# Patient Record
Sex: Male | Born: 1980 | Race: Black or African American | Hispanic: No | Marital: Single | Smoking: Current every day smoker
Health system: Southern US, Community
[De-identification: ages and names within clinical notes are randomized; demographics above are authoritative.]

## PROBLEM LIST (undated history)

## (undated) ENCOUNTER — Emergency Department (HOSPITAL_COMMUNITY): Admission: EM | Payer: Self-pay | Source: Home / Self Care

## (undated) DIAGNOSIS — F329 Major depressive disorder, single episode, unspecified: Secondary | ICD-10-CM

## (undated) DIAGNOSIS — F3181 Bipolar II disorder: Secondary | ICD-10-CM

## (undated) DIAGNOSIS — I10 Essential (primary) hypertension: Secondary | ICD-10-CM

## (undated) DIAGNOSIS — F1120 Opioid dependence, uncomplicated: Secondary | ICD-10-CM

## (undated) DIAGNOSIS — F32A Depression, unspecified: Secondary | ICD-10-CM

## (undated) DIAGNOSIS — S0300XA Dislocation of jaw, unspecified side, initial encounter: Secondary | ICD-10-CM

## (undated) HISTORY — PX: FRACTURE SURGERY: SHX138

## (undated) HISTORY — DX: Opioid dependence, uncomplicated: F11.20

---
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
## 2001-11-17 ENCOUNTER — Encounter: Payer: Self-pay | Admitting: Emergency Medicine

## 2001-11-17 ENCOUNTER — Emergency Department (HOSPITAL_COMMUNITY): Admission: EM | Admit: 2001-11-17 | Discharge: 2001-11-17 | Payer: Self-pay | Admitting: Emergency Medicine

## 2006-10-31 ENCOUNTER — Emergency Department (HOSPITAL_COMMUNITY): Admission: EM | Admit: 2006-10-31 | Discharge: 2006-10-31 | Payer: Self-pay | Admitting: Emergency Medicine

## 2007-01-31 ENCOUNTER — Emergency Department (HOSPITAL_COMMUNITY): Admission: EM | Admit: 2007-01-31 | Discharge: 2007-01-31 | Payer: Self-pay | Admitting: Family Medicine

## 2009-04-26 ENCOUNTER — Emergency Department (HOSPITAL_COMMUNITY): Admission: EM | Admit: 2009-04-26 | Discharge: 2009-04-27 | Payer: Self-pay | Admitting: Internal Medicine

## 2010-02-17 ENCOUNTER — Emergency Department (HOSPITAL_COMMUNITY): Admission: EM | Admit: 2010-02-17 | Discharge: 2010-02-17 | Payer: Self-pay | Admitting: Emergency Medicine

## 2010-03-15 ENCOUNTER — Emergency Department (HOSPITAL_COMMUNITY): Admission: EM | Admit: 2010-03-15 | Discharge: 2010-03-15 | Payer: Self-pay | Admitting: Emergency Medicine

## 2011-04-03 LAB — DIFFERENTIAL
Basophils Absolute: 0 10*3/uL (ref 0.0–0.1)
Eosinophils Absolute: 0 10*3/uL (ref 0.0–0.7)
Eosinophils Relative: 0 % (ref 0–5)
Lymphs Abs: 0.9 10*3/uL (ref 0.7–4.0)
Neutrophils Relative %: 79 % — ABNORMAL HIGH (ref 43–77)

## 2011-04-03 LAB — BASIC METABOLIC PANEL
CO2: 29 mEq/L (ref 19–32)
GFR calc Af Amer: >60 mL/min (ref >60)
Potassium: 3.6 mEq/L (ref 3.5–5.1)

## 2011-04-03 LAB — CBC
MCHC: 34.4 g/dL (ref 30.0–36.0)
MCV: 93.6 fL (ref 78.0–100.0)
Platelets: 162 10*3/uL (ref 150–400)

## 2011-04-03 LAB — RAPID URINE DRUG SCREEN, HOSP PERFORMED
Barbiturates: NOT DETECTED
Opiates: NOT DETECTED

## 2011-04-03 LAB — TRICYCLICS SCREEN, URINE: TCA Scrn: NOT DETECTED

## 2011-04-03 LAB — ETHANOL: Alcohol, Ethyl (B): 72 mg/dL — ABNORMAL HIGH (ref 0–10)

## 2013-05-04 ENCOUNTER — Encounter (HOSPITAL_COMMUNITY): Payer: Self-pay | Admitting: Emergency Medicine

## 2013-05-04 ENCOUNTER — Emergency Department (HOSPITAL_COMMUNITY)
Admission: EM | Admit: 2013-05-04 | Discharge: 2013-05-04 | Disposition: A | Discharge status: Other Acute Inpatient Hospital | Payer: Self-pay | Attending: Emergency Medicine | Admitting: Emergency Medicine

## 2013-05-04 ENCOUNTER — Inpatient Hospital Stay (HOSPITAL_COMMUNITY)
Admission: AD | Admit: 2013-05-04 | Discharge: 2013-05-08 | DRG: 885 | Disposition: A | Discharge status: Home / Self Care | Payer: Federal, State, Local not specified - Other | Source: Intra-hospital | Attending: Psychiatry | Admitting: Psychiatry

## 2013-05-04 DIAGNOSIS — F32A Depression, unspecified: Secondary | ICD-10-CM

## 2013-05-04 DIAGNOSIS — R45850 Homicidal ideations: Secondary | ICD-10-CM

## 2013-05-04 DIAGNOSIS — F1424 Cocaine dependence with cocaine-induced mood disorder: Secondary | ICD-10-CM

## 2013-05-04 DIAGNOSIS — R45851 Suicidal ideations: Secondary | ICD-10-CM

## 2013-05-04 DIAGNOSIS — F17200 Nicotine dependence, unspecified, uncomplicated: Secondary | ICD-10-CM | POA: Insufficient documentation

## 2013-05-04 DIAGNOSIS — F3289 Other specified depressive episodes: Secondary | ICD-10-CM | POA: Insufficient documentation

## 2013-05-04 DIAGNOSIS — F319 Bipolar disorder, unspecified: Secondary | ICD-10-CM

## 2013-05-04 DIAGNOSIS — F329 Major depressive disorder, single episode, unspecified: Secondary | ICD-10-CM | POA: Insufficient documentation

## 2013-05-04 DIAGNOSIS — Z8659 Personal history of other mental and behavioral disorders: Secondary | ICD-10-CM | POA: Insufficient documentation

## 2013-05-04 DIAGNOSIS — I10 Essential (primary) hypertension: Secondary | ICD-10-CM | POA: Diagnosis present

## 2013-05-04 DIAGNOSIS — F1410 Cocaine abuse, uncomplicated: Secondary | ICD-10-CM | POA: Diagnosis present

## 2013-05-04 DIAGNOSIS — F314 Bipolar disorder, current episode depressed, severe, without psychotic features: Principal | ICD-10-CM | POA: Diagnosis present

## 2013-05-04 DIAGNOSIS — Z79899 Other long term (current) drug therapy: Secondary | ICD-10-CM

## 2013-05-04 HISTORY — DX: Essential (primary) hypertension: I10

## 2013-05-04 HISTORY — DX: Bipolar II disorder: F31.81

## 2013-05-04 LAB — ETHANOL: Alcohol, Ethyl (B): <11 mg/dL (ref 0–11)

## 2013-05-04 LAB — COMPREHENSIVE METABOLIC PANEL
ALT: 31 U/L (ref 0–53)
AST: 28 U/L (ref 0–37)
Alkaline Phosphatase: 36 U/L — ABNORMAL LOW (ref 39–117)
CO2: 28 mEq/L (ref 19–32)
Glucose, Bld: 95 mg/dL (ref 70–99)
Sodium: 143 mEq/L (ref 135–145)
Total Protein: 7.9 g/dL (ref 6.0–8.3)

## 2013-05-04 LAB — RAPID URINE DRUG SCREEN, HOSP PERFORMED
Benzodiazepines: NOT DETECTED
Cocaine: POSITIVE — AB

## 2013-05-04 LAB — CBC WITH DIFFERENTIAL/PLATELET
Basophils Absolute: 0.1 10*3/uL (ref 0.0–0.1)
Basophils Relative: 1 % (ref 0–1)
Eosinophils Absolute: 0.1 10*3/uL (ref 0.0–0.7)
Eosinophils Relative: 1 % (ref 0–5)
Hemoglobin: 15.5 g/dL (ref 13.0–17.0)
Lymphs Abs: 2.1 10*3/uL (ref 0.7–4.0)
MCH: 31.8 pg (ref 26.0–34.0)
Monocytes Absolute: 0.7 10*3/uL (ref 0.1–1.0)
Platelets: 186 10*3/uL (ref 150–400)
WBC: 8 10*3/uL (ref 4.0–10.5)

## 2013-05-04 LAB — URINALYSIS, ROUTINE W REFLEX MICROSCOPIC
Bilirubin Urine: NEGATIVE
Glucose, UA: NEGATIVE mg/dL
Hgb urine dipstick: NEGATIVE
Ketones, ur: NEGATIVE mg/dL
Protein, ur: NEGATIVE mg/dL

## 2013-05-04 LAB — VALPROIC ACID LEVEL: Valproic Acid Lvl: <10 ug/mL — ABNORMAL LOW (ref 50.0–100.0)

## 2013-05-04 MED ORDER — ACETAMINOPHEN 325 MG PO TABS: 650.0000 mg | ORAL_TABLET | ORAL | Status: DC | PRN

## 2013-05-04 MED ORDER — LORAZEPAM 1 MG PO TABS: 1.0000 mg | ORAL_TABLET | Freq: Three times a day (TID) | ORAL | Status: DC | PRN

## 2013-05-04 MED ORDER — IBUPROFEN 200 MG PO TABS: 600.0000 mg | ORAL_TABLET | Freq: Three times a day (TID) | ORAL | Status: DC | PRN

## 2013-05-04 NOTE — ED Notes (Signed)
Patient states just got out of jail 4 x days ago.  Patient states that he just wants to get help.  Patient advised both mother and father were murdered last year and has been having problems since.   Patient states he feels depressed and homicidal.  Patient states he feels homicidal towards family members and mother/father friends at times.   Patient states does not feel suicidal.

## 2013-05-04 NOTE — BH Assessment (Signed)
[facility] Assessment Progress Note   Patient has been accepted to [facility] by Donell Sievert, PA to Dr. Dub Mikes.  Room 300-2.  Nurse Dorathy Daft notified.  Patient has signed voluntary admission papers.  EDP notified.

## 2013-05-04 NOTE — BH Assessment (Signed)
[facility] Assessment Progress Note   This clinician talked to patient to get clarification about his SI/HI.  Patient talked about having feelings of wanting to harm other people.  When asked for clarification he stated "I have thoughts about killing people."  Patient did not name a particular person in mind but did say that he felt that way about some extended family members & family friend that "judge me" regarding how he should feel about his father murdering his mother then killing himself.  Patient said that he wanted to yell at them and ask "how would you feel if your mother was shot eight times?!"   Patient does talk about feeling paranoid.  Also is worried that if left untreated he could end up like his father and kill someone.  Patient says that he feels like his father was bi-polar and that he is the same way.  Patient says that he is still living in parents home and that this is not helping any but feels that he cannot move until he can move away from [facility].  Patient is interested in going back to [facility] but is concerned about getting started back on medication.  He is not currently evidencing any withdrawal symptoms.  Dr. Leretha Pol [facility] telepsychiatrist) had recommended inpatient psychiatric treatment.

## 2013-05-04 NOTE — ED Notes (Signed)
Called house coverage to advise that we need a sitter.   No coverage available at this time.   Advised charge Patent examiner need.  To get an aide to sit with patient.

## 2013-05-04 NOTE — ED Notes (Signed)
All belongings have been received and patient has been placed in paper scrubs.   Patient has been wanded by security.
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
## 2013-05-04 NOTE — ED Notes (Signed)
All cords, etc. Have been removed from room.   Jody, tech, is sitting with patient.

## 2013-05-04 NOTE — ED Notes (Signed)
Pt belongings placed in designated area in bin with pts identification information labeled on belongings, pt updated that items are secured, sitter at bedside, pt updated on SI & HI precautions & verbalizes understanding, will continue to monitor

## 2013-05-04 NOTE — ED Notes (Signed)
Patient belongings inventoried and valuables given to security and placed in lock box.  Patient had cash $19.00; change $1.30; check [phone]; cell phone and charger, set of keys and private documents placed in the valuables folder to security.  The other belongings were placed in a belongings bag and will go with patient to POD C.  (yellow shirt, pair of Air Jordans, pair of black pants, pair of black socks, partial pack of newports).

## 2013-05-05 ENCOUNTER — Encounter (HOSPITAL_COMMUNITY): Payer: Self-pay

## 2013-05-05 DIAGNOSIS — F4310 Post-traumatic stress disorder, unspecified: Secondary | ICD-10-CM

## 2013-05-05 DIAGNOSIS — F319 Bipolar disorder, unspecified: Secondary | ICD-10-CM

## 2013-05-05 DIAGNOSIS — F3130 Bipolar disorder, current episode depressed, mild or moderate severity, unspecified: Secondary | ICD-10-CM

## 2013-05-05 DIAGNOSIS — F1410 Cocaine abuse, uncomplicated: Secondary | ICD-10-CM

## 2013-05-05 DIAGNOSIS — F1424 Cocaine dependence with cocaine-induced mood disorder: Secondary | ICD-10-CM

## 2013-05-05 MED ORDER — ACETAMINOPHEN 325 MG PO TABS: 650.0000 mg | ORAL_TABLET | Freq: Four times a day (QID) | ORAL | Status: DC | PRN

## 2013-05-05 MED ORDER — MAGNESIUM HYDROXIDE 400 MG/5ML PO SUSP: 30.0000 mL | Freq: Every day | ORAL | Status: DC | PRN

## 2013-05-05 MED ORDER — NICOTINE 14 MG/24HR TD PT24
14.0000 mg | MEDICATED_PATCH | Freq: Every day | TRANSDERMAL | Status: DC
  Filled 2013-05-05 (×3): qty 1

## 2013-05-05 MED ORDER — DIVALPROEX SODIUM ER 500 MG PO TB24
500.0000 mg | ORAL_TABLET | Freq: Two times a day (BID) | ORAL | Status: DC
  Administered 2013-05-05 – 2013-05-08 (×7): 500 mg via ORAL
  Filled 2013-05-05 (×10): qty 1

## 2013-05-05 MED ORDER — TRAZODONE HCL 50 MG PO TABS
50.0000 mg | ORAL_TABLET | Freq: Every evening | ORAL | Status: DC | PRN
  Administered 2013-05-05 – 2013-05-06 (×2): 50 mg via ORAL
  Filled 2013-05-05 (×8): qty 1

## 2013-05-05 MED ORDER — CHLORDIAZEPOXIDE HCL 25 MG PO CAPS: 25.0000 mg | ORAL_CAPSULE | Freq: Four times a day (QID) | ORAL | Status: DC | PRN

## 2013-05-05 MED ORDER — ALUM & MAG HYDROXIDE-SIMETH 200-200-20 MG/5ML PO SUSP: 30.0000 mL | ORAL | Status: DC | PRN

## 2013-05-05 NOTE — [facility] Group Notes (Signed)
[facility] LCSW Group Therapy  05/05/2013  1:15 PM  Type of Therapy:  Group Therapy 1:15 to 2:30 PM  Participation Level:  Active  Participation Quality:  Appropriate  Affect:  Appropriate  Cognitive:  Alert and Oriented  Insight:  None shared  Engagement in Therapy:  Developing  Modes of Intervention:  Discussion, Education, Exploration and Support  Summary of Progress/Problems: Patient attended group presentation by staff member of  [employer] ([employer]). Maximo was attentive to presenter and others who shared during group session.  Appeared to identify with facilitator as he shared his story with group.   Clide Dales

## 2013-05-05 NOTE — Progress Notes (Signed)
Pt observed in his room in bed at this time.  He reports he is doing better this evening, but still depressed/sad.  He denies SI/HI/AV.  He is not having any withdrawal symptoms at this time.  He feels he needs to move out of his parents home because there are so many memories, but is unsure what his definite plans are.  He has attended some groups.  He makes his needs known to staff.  Support and encouragement offered.  Safety maintained with q15 minute checks.
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
## 2013-05-05 NOTE — Progress Notes (Signed)
Pt has been up for most groups today.  He rated his depression a 1 he stated,"I am not really depressed but I do feel anxious at 7 and hopelessness an 8".  Pt lives alone and he is hoping to move and placing himself "in a more positive environment" He denied any S/H ideations or A/V hallucinations.

## 2013-05-05 NOTE — [facility] Group Notes (Addendum)
[facility] LCSW Aftercare Discharge Planning Group Note   05/05/2013 8:45 AM  Participation Quality:  Did Not Attend   Dustin Marquez, LCSWA 05/05/2013  10:08 AM

## 2013-05-05 NOTE — [facility] Suicide Risk Assessment (Signed)
Suicide Risk Assessment  Admission Assessment     Nursing information obtained from:  Patient Demographic factors:  Male;Adolescent or young adult;Low socioeconomic status;Living alone;Unemployed Current Mental Status:  NA Loss Factors:  Decrease in vocational status;Loss of significant relationship;Legal issues;Financial problems / change in socioeconomic status Historical Factors:  Family history of suicide;Family history of mental illness or substance abuse;Domestic violence in family of origin Risk Reduction Factors:  Responsible for children under 63 years of age;Religious beliefs about death;Positive therapeutic relationship  CLINICAL FACTORS:   Bipolar Disorder:   Depressive phase Alcohol/Substance Abuse/Dependencies  COGNITIVE FEATURES THAT CONTRIBUTE TO RISK: None identified   SUICIDE RISK:   Moderate:  Frequent suicidal ideation with limited intensity, and duration, some specificity in terms of plans, no associated intent, good self-control, limited dysphoria/symptomatology, some risk factors present, and identifiable protective factors, including available and accessible social support.  PLAN OF CARE: Supportive approach/coping skills/relapse prevention/grief-loss                               Reassess and optimize treatment with medication  I certify that inpatient services furnished can reasonably be expected to improve the patient's condition.  Ryelynn Guedea A 05/05/2013, 4:26 PM

## 2013-05-05 NOTE — Progress Notes (Signed)
Recreation Therapy Notes  Date: 05.13.2014 Time: 3:00pm Location: [address] Dayroom  Group Topic/Focus: Problem Solving  Participation Level:  Did not attend  [employer], LRT/CTRS  Jearl Klinefelter 05/05/2013 5:30 PM
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
## 2013-05-05 NOTE — Tx Team (Signed)
Initial Interdisciplinary Treatment Plan  PATIENT STRENGTHS: (choose at least two) Ability for insight Active sense of humor Average or above average intelligence Capable of independent living General fund of knowledge Motivation for treatment/growth Physical Health Religious Affiliation  PATIENT STRESSORS: Substance abuse Traumatic event Dad murdered his mom and then commited suicide 07/2012   PROBLEM LIST: Problem List/Patient Goals Date to be addressed Date deferred Reason deferred Estimated date of resolution  Substance Abuse: cocaine  5/13     Depression 5/13     Increased risk for SI in r/t to family hx.... Although this pt is denying 5/13                                          DISCHARGE CRITERIA:  Improved stabilization in mood, thinking, and/or behavior Motivation to continue treatment in a less acute level of care Need for constant or close observation no longer present Verbal commitment to aftercare and medication compliance  PRELIMINARY DISCHARGE PLAN: Outpatient therapy Return to previous living arrangement  PATIENT/FAMIILY INVOLVEMENT: This treatment plan has been presented to and reviewed with the patient, TERRIL CHESTNUT.  The patient and family have been given the opportunity to ask questions and make suggestions.  Brydon Spahr A 05/05/2013, 6:09 AM

## 2013-05-05 NOTE — Progress Notes (Addendum)
Pt is a 32 yr old male admitted for cocaine and ETOH use. Pt is also endorsing depression, anxiety, and HI towards his parents friends. Last August his father murdered his mom and then committed suicide afterwards. This is this pt's primary stressor. Pt has Bipolar II disorder and has been self-medicating with cocaine and ETOH. He reports an average of  3-4 25 oz beers( every other day) and about $40-$80 of cocaine daily. Pt UDS was (+) for cocaine only. Pt was recently released from jail for an assault charge.Therefore his drug use has recently restarted.  He has an upcoming court date on July 15th. Besides Bipolar II this pt has an past medical hx of HTN. Pt is currently denying any SI or AVH. Pt is HI towards his parents' friends and family. Family has been fighting over his parents money. He is  reporting minimal support from only his brother. Pt has a PMH of htn and Bipolar II disorder. Pt has been orientated to the unit's policies and procedures. Pt remains safe at this time with q65min checks.

## 2013-05-05 NOTE — Progress Notes (Signed)
Adult Psychoeducational Group Note  Date:  05/05/2013 Time:  11:38 AM  Group Topic/Focus:  Recovery Goals:   The focus of this group is to identify appropriate goals for recovery and establish a plan to achieve them.  Participation Level:  Did Not Attend   Additional Comments: Patient did not attend due to being in a meeting with the doctor.   Dustin Marquez 05/05/2013, 11:38 AM

## 2013-05-06 NOTE — Tx Team (Signed)
Interdisciplinary Treatment Plan Update (Adult)  Date: 05/06/2013  Time Reviewed:  9:45 AM  Progress in Treatment: Attending groups: Yes Participating in groups:  Yes Taking medication as prescribed:  Yes Tolerating medication:  Yes Family/Significant othe contact made: CSW assessing  Patient understands diagnosis:  Yes Discussing patient identified problems/goals with staff:  Yes Medical problems stabilized or resolved:  Yes Denies suicidal/homicidal ideation: Yes Issues/concerns per patient self-inventory:  Yes Other:  New problem(s) identified: N/A  Discharge Plan or Barriers: Pt will follow up outpatient at [facility] for medication management and therapy.    Reason for Continuation of Hospitalization: Anxiety Depression Medication Stabilization  Comments: N/A  Estimated length of stay: possible Friday d/c  For review of initial/current patient goals, please see plan of care.  Attendees: Patient:     Family:     Physician:  Dr. Dub Mikes 05/06/2013 8:38 AM   Nursing:   Roswell Miners, RN 05/06/2013 8:38 AM   Clinical Social Worker:  Reyes Ivan, LCSWA 05/06/2013 8:38 AM   Other: Robbie Louis, RN 05/06/2013 8:38 AM   Other:  Serena Colonel, NP 05/06/2013 9:51 AM   Other:  Liliane Bade, BSW (Transitional Care Manager) 05/06/2013 9:52 AM   Other:     Other:    Other:    Other:    Other:    Other:    Other:      Scribe for Treatment Team:   Carmina Miller, 05/06/2013 , 8:02 AM

## 2013-05-06 NOTE — [facility] Group Notes (Signed)
[facility] LCSW Aftercare Discharge Planning Group Note   05/06/2013 8:45 AM  Participation Quality:  Did Not Attend  Dustin Marquez, LCSWA 05/06/2013 9:59 AM

## 2013-05-06 NOTE — [facility] Group Notes (Signed)
[facility] LCSW Group Therapy  05/06/2013  1:15 PM   Type of Therapy:  Group Therapy  Participation Level:  Active  Participation Quality:  Appropriate and Attentive  Affect:  Appropriate, Depressed and Flat  Cognitive:  Alert and Appropriate  Insight:  Developing/Improving  Engagement in Therapy:  Developing/Improving  Modes of Intervention:  Clarification, Confrontation, Discussion, Education, Exploration, Limit-setting, Orientation, Problem-solving, Rapport Building, Dance movement psychotherapist, Socialization and Support  Summary of Progress/Problems: The topic for group today was emotional regulation.  Pt participated in the discussion regarding what emotional regulation is and how it affects their life, positive and negative.  Pt discussed coping skills and ways they can regulate their emotions in a positive manner.   Pt actively listened to group discussion and appeared engaged in what others were sharing.  Pt stated that he was just listening today and was taking in what others were saying, but did not participate any further in group discussion.    Dustin Marquez, [place] 05/06/2013 2:46 PM

## 2013-05-06 NOTE — Progress Notes (Signed)
[facility] MD Progress Note  05/06/2013 2:47 PM Dustin Marquez  MRN:  [id number] Subjective:  Trying to anticipate what is going to happen once he gets out of here. States that temporarily he is going back to the house, but that eventually they plan to sell it as it is full of bad memories. He needs to find a job. Eventually would like to go back to school for Fall Semester. His mood is still unstable.  Diagnosis:  Bipolar Depressed, cocaine abuse  ADL's:  Intact  Sleep: Fair  Appetite:  Fair  Suicidal Ideation:  Plan:  denies Intent:  denies Means:  denies Homicidal Ideation:  Plan:  denies Intent:  denies Means:  denies AEB (as evidenced by):  Psychiatric Specialty Exam: Review of Systems  Constitutional: Negative.   HENT: Negative.   Eyes: Negative.   Respiratory: Negative.   Cardiovascular: Negative.   Gastrointestinal: Negative.   Genitourinary: Negative.   Musculoskeletal: Negative.   Skin: Negative.   Neurological: Negative.   Endo/Heme/Allergies: Negative.   Psychiatric/Behavioral: Positive for depression and substance abuse. The patient is nervous/anxious.     Blood pressure 104/71, pulse 59, temperature 97.6 F (36.4 C), temperature source Oral, resp. rate 18, height 5\' 11"  (1.803 m), weight 69.854 kg (154 lb).Body mass index is 21.49 kg/(m^2).  General Appearance: Disheveled  Eye Solicitor::  Fair  Speech:  Clear and Coherent and Slow  Volume:  Decreased  Mood:  Depressed  Affect:  Restricted  Thought Process:  Coherent and Goal Directed  Orientation:  Full (Time, Place, and Person)  Thought Content:  worries, concerns  Suicidal Thoughts:  No  Homicidal Thoughts:  No  Memory:  Immediate;   Fair Recent;   Fair Remote;   Fair  Judgement:  Fair  Insight:  Present  Psychomotor Activity:  Restlessness  Concentration:  Fair  Recall:  Fair  Akathisia:  No  Handed:  Right  AIMS (if indicated):     Assets:  Desire for Improvement  Sleep:  Number of Hours:  6.75   Current Medications: Current Facility-Administered Medications  Medication Dose Route Frequency Provider Last Rate Last Dose  . acetaminophen (TYLENOL) tablet 650 mg  650 mg Oral Q6H PRN Kerry Hough, PA-C      . alum & mag hydroxide-simeth (MAALOX/MYLANTA) 200-200-20 MG/5ML suspension 30 mL  30 mL Oral Q4H PRN Kerry Hough, PA-C      . chlordiazePOXIDE (LIBRIUM) capsule 25 mg  25 mg Oral QID PRN Kerry Hough, PA-C      . divalproex (DEPAKOTE ER) 24 hr tablet 500 mg  500 mg Oral BID Sanjuana Kava, NP   500 mg at 05/06/13 0816  . magnesium hydroxide (MILK OF MAGNESIA) suspension 30 mL  30 mL Oral Daily PRN Kerry Hough, PA-C      . traZODone (DESYREL) tablet 50 mg  50 mg Oral QHS,MR X 1 JABRIEL VANDUYNE, PA-C   50 mg at 05/05/13 2145    Lab Results: No results found for this or any previous visit (from the past 48 hour(s)).  Physical Findings: AIMS: Facial and Oral Movements Muscles of Facial Expression: None, normal Lips and Perioral Area: None, normal Jaw: None, normal Tongue: None, normal,Extremity Movements Upper (arms, wrists, hands, fingers): None, normal Lower (legs, knees, ankles, toes): None, normal, Trunk Movements Neck, shoulders, hips: None, normal, Overall Severity Severity of abnormal movements (highest score from questions above): None, normal Incapacitation due to abnormal movements: None, normal Patient's awareness of abnormal  movements (rate only patient's report): No Awareness, Dental Status Current problems with teeth and/or dentures?: No Does patient usually wear dentures?: No  CIWA:  CIWA-Ar Total: 2 COWS:     Treatment Plan Summary: Daily contact with patient to assess and evaluate symptoms and progress in treatment Medication management  Plan: Supportive approach/coping skills/relapse prevention           Continue to optimize treatment with medications  Medical Decision Making Problem Points:  Review of last therapy session (1) and  Review of psycho-social stressors (1) Data Points:  Review of medication regiment & side effects (2)  I certify that inpatient services furnished can reasonably be expected to improve the patient's condition.   Bryson Palen A 05/06/2013, 2:47 PM

## 2013-05-06 NOTE — Progress Notes (Signed)
D- Patient has been appropriate this shift attending groups and interacting with peers.  Rates depression at 8 and hopelessness at 5.  Denies SI.  Affect is bright.  No physical complaints and no prn medications requested.  A- Support and encouragement given. Continue POC and evaluation of treatment goals. Continue 15' checks for safety.  R- Safety maintained.

## 2013-05-06 NOTE — Progress Notes (Signed)
Adult Psychoeducational Group Note  Date:  05/06/2013 Time:  1:07 PM  Group Topic/Focus:  Crisis Planning:   The purpose of this group is to help patients create a crisis plan for use upon discharge or in the future, as needed.  Participation Level:  Did Not Attend  Additional Comments:  Pt was prompted several times to come to group but instead slept through group.   Dalia Heading 05/06/2013, 1:07 PM

## 2013-05-07 NOTE — Progress Notes (Signed)
Patient ID: LORAS GRIESHOP, male   DOB: [birth year], 32 y.o.   MRN: [id number] [facility] MD Progress Note  05/07/2013 1:38 PM JEDAIAH RATHBUN  MRN:  [id number]  Subjective:  "Mr. Mas reports feeling much better today. States that he slept well last night. Rated depression at #3. Is currently considering moving out of his parents home. Would like to stay busy, probably go back to his current job, but does not plan to stay on this job for a long time. Will likely look for another job eventually. Planned on going to some counseling after discharge as well".  Diagnosis:  Bipolar Depressed, cocaine abuse  ADL's:  Intact  Sleep: "I slept better"  Appetite:  Fair  Suicidal Ideation:  Plan:  denies Intent:  denies Means:  denies Homicidal Ideation:  Plan:  denies Intent:  denies Means:  denies  AEB (as evidenced by): patient's reports.  Psychiatric Specialty Exam: Review of Systems  Constitutional: Negative.   HENT: Negative.   Eyes: Negative.   Respiratory: Negative.   Cardiovascular: Negative.   Gastrointestinal: Negative.   Genitourinary: Negative.   Musculoskeletal: Negative.   Skin: Negative.   Neurological: Negative.   Endo/Heme/Allergies: Negative.   Psychiatric/Behavioral: Positive for depression and substance abuse. The patient is nervous/anxious.     Blood pressure 119/80, pulse 74, temperature 98.4 F (36.9 C), temperature source Oral, resp. rate 18, height 5\' 11"  (1.803 m), weight 69.854 kg (154 lb).Body mass index is 21.49 kg/(m^2).  General Appearance: Disheveled  Eye Solicitor::  Fair  Speech:  Clear and Coherent and Slow  Volume:  Decreased  Mood:  Depressed  Affect:  Restricted  Thought Process:  Coherent and Goal Directed  Orientation:  Full (Time, Place, and Person)  Thought Content:  worries, concerns  Suicidal Thoughts:  No  Homicidal Thoughts:  No  Memory:  Immediate;   Fair Recent;   Fair Remote;   Fair  Judgement:  Fair  Insight:  Present   Psychomotor Activity:  Restlessness  Concentration:  Fair  Recall:  Fair  Akathisia:  No  Handed:  Right  AIMS (if indicated):     Assets:  Desire for Improvement  Sleep:  Number of Hours: 6.25   Current Medications: Current Facility-Administered Medications  Medication Dose Route Frequency Provider Last Rate Last Dose  . acetaminophen (TYLENOL) tablet 650 mg  650 mg Oral Q6H PRN Kerry Hough, PA-C      . alum & mag hydroxide-simeth (MAALOX/MYLANTA) 200-200-20 MG/5ML suspension 30 mL  30 mL Oral Q4H PRN Kerry Hough, PA-C      . chlordiazePOXIDE (LIBRIUM) capsule 25 mg  25 mg Oral QID PRN Kerry Hough, PA-C      . divalproex (DEPAKOTE ER) 24 hr tablet 500 mg  500 mg Oral BID Sanjuana Kava, NP   500 mg at 05/07/13 0826  . magnesium hydroxide (MILK OF MAGNESIA) suspension 30 mL  30 mL Oral Daily PRN Kerry Hough, PA-C      . traZODone (DESYREL) tablet 50 mg  50 mg Oral QHS,MR X 1 BINGHAM MILLETTE, PA-C   50 mg at 05/06/13 2134    Lab Results: No results found for this or any previous visit (from the past 48 hour(s)).  Physical Findings: AIMS: Facial and Oral Movements Muscles of Facial Expression: None, normal Lips and Perioral Area: None, normal Jaw: None, normal Tongue: None, normal,Extremity Movements Upper (arms, wrists, hands, fingers): None, normal Lower (legs, knees, ankles, toes): None, normal, Trunk  Movements Neck, shoulders, hips: None, normal, Overall Severity Severity of abnormal movements (highest score from questions above): None, normal Incapacitation due to abnormal movements: None, normal Patient's awareness of abnormal movements (rate only patient's report): No Awareness, Dental Status Current problems with teeth and/or dentures?: No Does patient usually wear dentures?: No  CIWA:  CIWA-Ar Total: 2 COWS:     Treatment Plan Summary: Daily contact with patient to assess and evaluate symptoms and progress in treatment Medication management  Plan:  Supportive approach/coping skills/relapse prevention Continue to optimize treatment with medications. Will benefit from grief counseling.  Medical Decision Making Problem Points:  Review of last therapy session (1) and Review of psycho-social stressors (1) Data Points:  Review of medication regiment & side effects (2)  I certify that inpatient services furnished can reasonably be expected to improve the patient's condition.   Armandina Stammer I 05/07/2013, 1:38 PM

## 2013-05-07 NOTE — Progress Notes (Signed)
Recreation Therapy Notes  Date: 05.15.2014 Time: 3:00pm Location: [address] Dayroom      Group Topic/Focus: Leisure Education  Participation Level: Did not attend  [employer], LRT/CTRS  Jearl Klinefelter 05/07/2013 4:25 PM

## 2013-05-07 NOTE — Progress Notes (Signed)
D: Patient appropriate and cooperative with staff and peers. Patient's affect/mood is depressed. Declined self inventory sheet today. Patient is attending and participating in groups. Compliant with medication regimen.  A: Support and encouragement provided to patient. Scheduled medications administered per MD orders. Maintain Q15 minute checks for safety.   R: Patient receptive. Denies SI/HI/AVH. Patient remains safe.

## 2013-05-07 NOTE — Progress Notes (Signed)
Patient ID: Dustin Marquez, male   DOB: Jan 04, 1981, 32 y.o.   MRN: [id number] D: Took over patient's care @ 2330. Patient in bed sleeping. Respiration regular and unlabored. No sign of distress noted at this time A: 15 mins checks for safety. R: Patient remains asleep. Pt is safe.

## 2013-05-07 NOTE — Progress Notes (Signed)
Patient did attend the evening karaoke group. Pt was attentive and supportive.   

## 2013-05-07 NOTE — [facility] Group Notes (Signed)
[facility] LCSW Aftercare Discharge Planning Group Note   05/07/2013 8:45 AM  Participation Quality:  Did Not Attend  CSW attempted to get pt up for group and pt stated that he is drowsy today and doesn't want to get up.  Pt states that he is hopeful he will d/c tomorrow.  CSW explained pt's follow up with [facility].  No further needs voiced by pt at this time.     Dustin Marquez, LCSWA 05/07/2013 9:24 AM

## 2013-05-07 NOTE — Tx Team (Signed)
Interdisciplinary Treatment Plan Update (Adult)  Date: 05/08/13 Time Reviewed:  9:45 AM  Progress in Treatment: Attending groups: Yes Participating in groups:  Yes Taking medication as prescribed:  Yes Tolerating medication:  Yes Family/Significant othe contact made: Consent given to talk to brother Patient understands diagnosis:  Yes Discussing patient identified problems/goals with staff:  Yes Medical problems stabilized or resolved:  Yes Denies suicidal/homicidal ideation: Yes Issues/concerns per patient self-inventory:  Yes Other:  New problem(s) identified: N/A  Discharge Plan or Barriers: Pt will follow up with [facility] for medication management and therapy.   Reason for Continuation of Hospitalization: Stable to d/c  Comments: N/A  Estimated length of stay: D/C today  For review of initial/current patient goals, please see plan of care.  Attendees: Patient:     Family:     Physician:  Dr. Dub Mikes 05/08/13 10:00 AM  Nursing:   Roswell Miners, RN 05/08/13 10:00 AM  Clinical Social Worker:  Reyes Ivan, LCSWA 05/08/13 10:00 AM  Other: Robbie Louis, RN 05/08/13 10:00 AM  Other:  Serena Colonel, NP 05/08/13 10:00 AM  Other:  Liliane Bade, BSW (Transitional Care Manager) 05/08/13 10:00 AM  Other:     Other:    Other:    Other:    Other:    Other:    Other:     Scribe for Treatment Team:   Reyes Ivan, LCSWA 05/08/13  10:35 AM

## 2013-05-07 NOTE — [facility] Group Notes (Signed)
[facility] LCSW Group Therapy  05/07/2013  1:15 PM   Type of Therapy:  Group Therapy  Participation Level:  Active  Participation Quality:  Appropriate and Attentive  Affect:  Appropriate, Flat and Depressed  Cognitive:  Alert and Appropriate  Insight:  Developing/Improving and Engaged  Engagement in Therapy:  Developing/Improving and Engaged  Modes of Intervention:  Clarification, Confrontation, Discussion, Education, Exploration, Limit-setting, Orientation, Problem-solving, Rapport Building, Socialization and Support  Summary of Progress/Problems: The topic for group was balance in life.  Pt participated in the discussion about when their life was in balance and out of balance and how this feels.  Pt discussed ways to get back in balance and short term goals they can work on to get where they want to be.  Pt shared that he was isolating so much from family and friends that he "went crazy".  Pt was able to relate to others about isolation but minimally shares.  Pt was quiet but appeared to actively listen to group discussion.    Reyes Ivan, LCSWA 05/07/2013 2:30 PM

## 2013-05-07 NOTE — B[facility] Suicide Risk Assessment (Signed)
[facility] INPATIENT:  Family/Significant Other Suicide Prevention Education  Suicide Prevention Education:  Education Completed; Dustin Marquez - brother [phone]),  (name of family member/significant other) has been identified by the patient as the family member/significant other with whom the patient will be residing, and identified as the person(s) who will aid the patient in the event of a mental health crisis (suicidal ideations/suicide attempt).  With written consent from the patient, the family member/significant other has been provided the following suicide prevention education, prior to the and/or following the discharge of the patient.  The suicide prevention education provided includes the following:  Suicide risk factors  Suicide prevention and interventions  National Suicide Hotline telephone number  [facility] assessment telephone number  [facility] Emergency Assistance 911  [facility] and/or Residential Mobile Crisis Unit telephone number  Request made of family/significant other to:  Remove weapons (e.g., guns, rifles, knives), all items previously/currently identified as safety concern.    Remove drugs/medications (over-the-counter, prescriptions, illicit drugs), all items previously/currently identified as a safety concern.  The family member/significant other verbalizes understanding of the suicide prevention education information provided.  The family member/significant other agrees to remove the items of safety concern listed above.  Dustin Marquez 05/07/2013, 11:14 AM

## 2013-05-07 NOTE — Progress Notes (Signed)
[facility] Adult Case Management Discharge Plan :  Will you be returning to the same living situation after discharge: Yes,  returning home At discharge, do you have transportation home?:Yes,  access to transportation Do you have the ability to pay for your medications:Yes,  access to meds  Release of information consent forms completed and in the chart;  Patient's signature needed at discharge.  Patient to Follow up at: Follow-up Information   Follow up with [facility] On 05/11/2013. (Appointment scheduled at 8:00 am promptly with Dr. Pecola Leisure for medication management (say that you're there for hospital discharge!!))    Contact information:   [address], [place] [address] Phone: [phone] Fax: [phone]      Patient denies SI/HI:   Yes,  denies SI/HI    Safety Planning and Suicide Prevention discussed:  Yes,  discussed with pt and pt's brother (see suicide prevention note)  Horton, Salome Arnt 05/07/2013, 12:16 PM

## 2013-05-08 DIAGNOSIS — F314 Bipolar disorder, current episode depressed, severe, without psychotic features: Principal | ICD-10-CM

## 2013-05-08 MED ORDER — DIVALPROEX SODIUM ER 500 MG PO TB24: 500.0000 mg | ORAL_TABLET | Freq: Two times a day (BID) | ORAL | Status: DC

## 2013-05-08 MED ORDER — TRAZODONE HCL 50 MG PO TABS: 50.0000 mg | ORAL_TABLET | Freq: Every evening | ORAL | Status: DC | PRN

## 2013-05-08 NOTE — Progress Notes (Signed)
D   Pt is appropriate and pleasant   He interacts well with others  And is compliant with treatment A   Verbal support given  Medications administered and effectiveness monitored   Q 15 min checks R   Pt safe at present

## 2013-05-08 NOTE — [facility] Suicide Risk Assessment (Signed)
Suicide Risk Assessment  Discharge Assessment     Demographic Factors:  Male  Mental Status Per Nursing Assessment::   On Admission:  NA  Current Mental Status by Physician: In full contact with reality. There are no suicidal ideas, plans or intent. His mood is euthymic. His affect is appropriate. There are no suicidal ideas plans or intent. He is planning to go back to his house. His brother is going to be there too. They are planning to eventually sell the house. He is committed to continue to take his medications, and abstain   Loss Factors: Loss of significant relationship, legal issues  Historical Factors: Family history of suicide  Risk Reduction Factors:   Sense of responsibility to family, Employed and Living with another person, especially Marquez relative  Continued Clinical Symptoms:  Bipolar Disorder:   Depressive phase Alcohol/Substance Abuse/Dependencies  Cognitive Features That Contribute To Risk: None identified   Suicide Risk:  Minimal: No identifiable suicidal ideation.  Patients presenting with no risk factors but with morbid ruminations; may be classified as minimal risk based on the severity of the depressive symptoms  Discharge Diagnoses:   AXIS I:  Bipolar Disorder, Cocaine Abuse AXIS II:  Deferred AXIS III:   Past Medical History  Diagnosis Date  . Hypertension   . Bipolar 2 disorder    AXIS IV:  other psychosocial or environmental problems AXIS V:  61-70 mild symptoms  Plan Of Care/Follow-up recommendations:  Activity:  as tolerated Diet:  regular Follow up [facility] Is patient on multiple antipsychotic therapies at discharge:  No   Has Patient had three or more failed trials of antipsychotic monotherapy by history:  No  Recommended Plan for Multiple Antipsychotic Therapies: N/Marquez   Dustin Marquez 05/08/2013, 9:54 AM
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
## 2013-05-08 NOTE — Progress Notes (Signed)
Discharge Note: Discharge instructions/prescriptions/medication samples given to patient. Patient verbalized understanding of discharge instructions and prescriptions. Returned belongings to patient. Denies SI/HI/AVH. Patient d/c without incident to the front lobby and transported by a relative.

## 2013-05-13 NOTE — Progress Notes (Signed)
Patient Discharge Instructions:  After Visit Summary (AVS):   Faxed to:  05/13/13 Discharge Summary Note:   Faxed to:  05/13/13 Psychiatric Admission Assessment Note:   Faxed to:  05/13/13 Suicide Risk Assessment - Discharge Assessment:   Faxed to:  05/13/13 Faxed/Sent to the Next Level Care provider:  05/13/13 Faxed to [facility] @ [phone]  Jerelene Redden, 05/13/2013, 5:04 PM

## 2013-09-06 ENCOUNTER — Emergency Department (HOSPITAL_COMMUNITY): Payer: Self-pay

## 2013-09-06 ENCOUNTER — Emergency Department (HOSPITAL_COMMUNITY)
Admission: EM | Admit: 2013-09-06 | Discharge: 2013-09-06 | Disposition: A | Discharge status: Home / Self Care | Payer: Self-pay | Attending: Emergency Medicine | Admitting: Emergency Medicine

## 2013-09-06 ENCOUNTER — Encounter (HOSPITAL_COMMUNITY): Payer: Self-pay

## 2013-09-06 DIAGNOSIS — Y929 Unspecified place or not applicable: Secondary | ICD-10-CM | POA: Insufficient documentation

## 2013-09-06 DIAGNOSIS — F17200 Nicotine dependence, unspecified, uncomplicated: Secondary | ICD-10-CM | POA: Insufficient documentation

## 2013-09-06 DIAGNOSIS — I10 Essential (primary) hypertension: Secondary | ICD-10-CM | POA: Insufficient documentation

## 2013-09-06 DIAGNOSIS — Y9389 Activity, other specified: Secondary | ICD-10-CM | POA: Insufficient documentation

## 2013-09-06 DIAGNOSIS — Z8659 Personal history of other mental and behavioral disorders: Secondary | ICD-10-CM | POA: Insufficient documentation

## 2013-09-06 DIAGNOSIS — S93409A Sprain of unspecified ligament of unspecified ankle, initial encounter: Secondary | ICD-10-CM | POA: Insufficient documentation

## 2013-09-06 DIAGNOSIS — Z79899 Other long term (current) drug therapy: Secondary | ICD-10-CM | POA: Insufficient documentation

## 2013-09-06 DIAGNOSIS — W010XXA Fall on same level from slipping, tripping and stumbling without subsequent striking against object, initial encounter: Secondary | ICD-10-CM | POA: Insufficient documentation

## 2013-09-06 MED ORDER — IBUPROFEN 800 MG PO TABS
800.0000 mg | ORAL_TABLET | Freq: Once | ORAL | Status: AC
  Administered 2013-09-06: 800 mg via ORAL
  Filled 2013-09-06: qty 1

## 2013-09-06 MED ORDER — IBUPROFEN 800 MG PO TABS: 800.0000 mg | ORAL_TABLET | Freq: Three times a day (TID) | ORAL | Status: DC | PRN
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
## 2013-09-07 NOTE — ED Provider Notes (Signed)
Medical screening examination/treatment/procedure(s) were performed by non-physician practitioner and as supervising physician I was immediately available for consultation/collaboration.   Candyce Churn, MD 09/07/13 1106

## 2013-09-15 ENCOUNTER — Encounter (HOSPITAL_COMMUNITY): Payer: Self-pay | Admitting: Emergency Medicine

## 2013-09-15 ENCOUNTER — Emergency Department (HOSPITAL_COMMUNITY)
Admission: EM | Admit: 2013-09-15 | Discharge: 2013-09-16 | Disposition: A | Discharge status: Home / Self Care | Payer: Self-pay | Attending: Emergency Medicine | Admitting: Emergency Medicine

## 2013-09-15 DIAGNOSIS — F329 Major depressive disorder, single episode, unspecified: Secondary | ICD-10-CM | POA: Insufficient documentation

## 2013-09-15 DIAGNOSIS — F17200 Nicotine dependence, unspecified, uncomplicated: Secondary | ICD-10-CM | POA: Insufficient documentation

## 2013-09-15 DIAGNOSIS — F3289 Other specified depressive episodes: Secondary | ICD-10-CM | POA: Insufficient documentation

## 2013-09-15 DIAGNOSIS — F3189 Other bipolar disorder: Secondary | ICD-10-CM | POA: Insufficient documentation

## 2013-09-15 DIAGNOSIS — F314 Bipolar disorder, current episode depressed, severe, without psychotic features: Secondary | ICD-10-CM

## 2013-09-15 DIAGNOSIS — Z9119 Patient's noncompliance with other medical treatment and regimen: Secondary | ICD-10-CM | POA: Insufficient documentation

## 2013-09-15 DIAGNOSIS — Z91199 Patient's noncompliance with other medical treatment and regimen due to unspecified reason: Secondary | ICD-10-CM | POA: Insufficient documentation

## 2013-09-15 DIAGNOSIS — I10 Essential (primary) hypertension: Secondary | ICD-10-CM | POA: Insufficient documentation

## 2013-09-15 HISTORY — DX: Major depressive disorder, single episode, unspecified: F32.9

## 2013-09-15 HISTORY — DX: Dislocation of jaw, unspecified side, initial encounter: S03.00XA

## 2013-09-15 HISTORY — DX: Depression, unspecified: F32.A

## 2013-09-15 LAB — COMPREHENSIVE METABOLIC PANEL
ALT: 23 U/L (ref 0–53)
AST: 20 U/L (ref 0–37)
Albumin: 4 g/dL (ref 3.5–5.2)
CO2: 29 mEq/L (ref 19–32)
Calcium: 9.2 mg/dL (ref 8.4–10.5)
Chloride: 101 mEq/L (ref 96–112)
GFR calc non Af Amer: 84 mL/min — ABNORMAL LOW (ref >90)
Sodium: 136 mEq/L (ref 135–145)

## 2013-09-15 LAB — CBC
MCH: 31.3 pg (ref 26.0–34.0)
Platelets: 265 10*3/uL (ref 150–400)
RBC: 4.86 MIL/uL (ref 4.22–5.81)
RDW: 13 % (ref 11.5–15.5)
WBC: 7.4 10*3/uL (ref 4.0–10.5)

## 2013-09-15 LAB — RAPID URINE DRUG SCREEN, HOSP PERFORMED
Amphetamines: NOT DETECTED
Barbiturates: NOT DETECTED
Cocaine: NOT DETECTED
Tetrahydrocannabinol: NOT DETECTED

## 2013-09-15 MED ORDER — IBUPROFEN 800 MG PO TABS
800.0000 mg | ORAL_TABLET | Freq: Three times a day (TID) | ORAL | Status: DC | PRN
  Administered 2013-09-15: 800 mg via ORAL
  Filled 2013-09-15: qty 1

## 2013-09-15 MED ORDER — ZOLPIDEM TARTRATE 5 MG PO TABS: 5.0000 mg | ORAL_TABLET | Freq: Every evening | ORAL | Status: DC | PRN

## 2013-09-15 MED ORDER — ONDANSETRON HCL 4 MG PO TABS: 4.0000 mg | ORAL_TABLET | Freq: Three times a day (TID) | ORAL | Status: DC | PRN

## 2013-09-15 MED ORDER — ACETAMINOPHEN 325 MG PO TABS: 650.0000 mg | ORAL_TABLET | ORAL | Status: DC | PRN

## 2013-09-15 MED ORDER — LORAZEPAM 1 MG PO TABS: 1.0000 mg | ORAL_TABLET | Freq: Three times a day (TID) | ORAL | Status: DC | PRN

## 2013-09-15 MED ORDER — DIVALPROEX SODIUM ER 500 MG PO TB24
500.0000 mg | ORAL_TABLET | Freq: Two times a day (BID) | ORAL | Status: DC
  Administered 2013-09-15 – 2013-09-16 (×3): 500 mg via ORAL
  Filled 2013-09-15 (×6): qty 1
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
## 2013-09-15 NOTE — ED Notes (Signed)
Patient belongings searched by security and secured in TCU locker #[id number].

## 2013-09-15 NOTE — Progress Notes (Signed)
[facility] provided pt with a [employer] Orange Card application, highlighting [facility].  °

## 2013-09-15 NOTE — ED Notes (Signed)
Per EMS. Patient called 911 for medical clearance. Patient reports histroy of bipolar and has been off his meds for an extended period of time due to financial reasons. Patient reports his father committed suicide in 2013. Patient denies SI to EMS. Patient A&O x4, NAD. Walked in.

## 2013-09-15 NOTE — ED Notes (Signed)
Patient resting in bed with no s/s of distress noted. Pt pleasant and cooperative with staff. Pt denies SI/HI at this time.

## 2013-09-15 NOTE — ED Notes (Signed)
Patient sleeping, sitter in room will do vitals when pt awakes or within the next 2 hours.

## 2013-09-15 NOTE — ED Notes (Signed)
Patient states his headache started just prior to [employer] arrival at his home. Patient denies taking any medication PTA.

## 2013-09-15 NOTE — Consult Note (Signed)
Dustin Sheckler [birth year] [id number]  Subjective Patient states that he is having suicidal and homicidal thoughts. Patient denies psychosis but states that he has paranoia that some one is out to get him.  Patient states that he felt that he was going to hurt some one so he came to the hospital.  Patient states that he has been off of his medication for 3-4 months (Depakote).  Patient states that he has to be in court on 09/21/2013 on charges Felony posession of gun.     Current Medication Current facility-administered medications:acetaminophen (TYLENOL) tablet 650 mg, 650 mg, Oral, Q4H PRN, Lyanne Co, MD;  divalproex (DEPAKOTE ER) 24 hr tablet 500 mg, 500 mg, Oral, BID, Lyanne Co, MD, 500 mg at 09/15/13 0413;  ibuprofen (ADVIL,MOTRIN) tablet 800 mg, 800 mg, Oral, Q8H PRN, Lyanne Co, MD, 800 mg at 09/15/13 0413;  LORazepam (ATIVAN) tablet 1 mg, 1 mg, Oral, Q8H PRN, Lyanne Co, MD ondansetron [facility]) tablet 4 mg, 4 mg, Oral, Q8H PRN, Lyanne Co, MD;  zolpidem [facility]) tablet 5 mg, 5 mg, Oral, QHS PRN, Lyanne Co, MD Current outpatient prescriptions:ibuprofen (ADVIL,MOTRIN) 200 MG tablet, Take 400 mg by mouth every 6 (six) hours as needed for pain., Disp: , Rfl:   Current results  Recent Results (from the past 2160 hour(s))  CBC     Status: None   Collection Time    09/15/13  3:50 AM      Result Value Range   WBC 7.4  4.0 - 10.5 K/uL   RBC 4.86  4.22 - 5.81 MIL/uL   Hemoglobin 15.2  13.0 - 17.0 g/dL   HCT 40.9  81.1 - 91.4 %   MCV 92.8  78.0 - 100.0 fL   MCH 31.3  26.0 - 34.0 pg   MCHC 33.7  30.0 - 36.0 g/dL   RDW 78.2  95.6 - 21.3 %   Platelets 265  150 - 400 K/uL  COMPREHENSIVE METABOLIC PANEL     Status: Abnormal   Collection Time    09/15/13  3:50 AM      Result Value Range   Sodium 136  135 - 145 mEq/L   Potassium 3.8  3.5 - 5.1 mEq/L   Chloride 101  96 - 112 mEq/L   CO2 29  19 - 32 mEq/L   Glucose, Bld 95  70 - 99 mg/dL   BUN 9  6 - 23 mg/dL   Creatinine, Ser 0.86  0.50 - 1.35 mg/dL   Calcium 9.2  8.4 - 57.8 mg/dL   Total Protein 7.3  6.0 - 8.3 g/dL   Albumin 4.0  3.5 - 5.2 g/dL   AST 20  0 - 37 U/L   ALT 23  0 - 53 U/L   Alkaline Phosphatase 30 (*) 39 - 117 U/L   Total Bilirubin 0.5  0.3 - 1.2 mg/dL   GFR calc non Af Amer 84 (*) >90 mL/min   GFR calc Af Amer >90  >90 mL/min   Comment: (NOTE)     The eGFR has been calculated using the CKD EPI equation.     This calculation has not been validated in all clinical situations.     eGFR's persistently <90 mL/min signify possible Chronic Kidney     Disease.  ETHANOL     Status: None   Collection Time    09/15/13  3:50 AM      Result Value Range   Alcohol, Ethyl (B) <11  0 - 11 mg/dL   Comment:            LOWEST DETECTABLE LIMIT FOR     SERUM ALCOHOL IS 11 mg/dL     FOR MEDICAL PURPOSES ONLY  URINE RAPID DRUG SCREEN (HOSP PERFORMED)     Status: None   Collection Time    09/15/13  4:19 AM      Result Value Range   Opiates NONE DETECTED  NONE DETECTED   Cocaine NONE DETECTED  NONE DETECTED   Benzodiazepines NONE DETECTED  NONE DETECTED   Amphetamines NONE DETECTED  NONE DETECTED   Tetrahydrocannabinol NONE DETECTED  NONE DETECTED   Barbiturates NONE DETECTED  NONE DETECTED   Comment:            DRUG SCREEN FOR MEDICAL PURPOSES     ONLY.  IF CONFIRMATION IS NEEDED     FOR ANY PURPOSE, NOTIFY LAB     WITHIN 5 DAYS.                LOWEST DETECTABLE LIMITS     FOR URINE DRUG SCREEN     Drug Class       Cutoff (ng/mL)     Amphetamine      1000     Barbiturate      200     Benzodiazepine   200     Tricyclics       300     Opiates          300     Cocaine          300     THC              50  VALPROIC ACID LEVEL     Status: Abnormal   Collection Time    09/15/13 12:30 PM      Result Value Range   Valproic Acid Lvl 18.2 (*) 50.0 - 100.0 ug/mL   Comment: Performed at [facility]   Assessment Axis I: Depressive Disorder NOS and Mood Disorder NOS Axis II:  Deferred Axis III:  Past Medical History  Diagnosis Date  . Hypertension   . Bipolar 2 disorder   . Jaw dislocation   . Depression    Axis IV: other psychosocial or environmental problems, problems related to legal system/crime, problems related to social environment and problems with primary support group Axis V: 51-60 moderate symptoms  Face to face interview and consult with Dr. Lucianne Muss  Plan Disposition:  Reassess in the morning for disposition.  Depakote level and start medication.    Monitor for safety and stabilization.
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
## 2013-09-15 NOTE — ED Notes (Signed)
Dr. Campos at bedside   

## 2013-09-15 NOTE — ED Notes (Signed)
Bed: ZO10 Expected date: 09/15/13 Expected time: 3:00 AM Means of arrival: Ambulance Comments: Psych eval

## 2013-09-15 NOTE — ED Notes (Signed)
Sitter at bedside.

## 2013-09-15 NOTE — ED Notes (Signed)
Security called to wand patient and search belongings. Currently secured at nurses station.

## 2013-09-16 ENCOUNTER — Encounter (HOSPITAL_COMMUNITY): Payer: Self-pay | Admitting: Registered Nurse

## 2013-09-16 MED ORDER — DIVALPROEX SODIUM ER 500 MG PO TB24: 500.0000 mg | ORAL_TABLET | Freq: Two times a day (BID) | ORAL | Status: DC
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
## 2013-09-16 NOTE — BH Assessment (Signed)
Writer gave pt list of outpatient resources including the following:  [facility]  [facility] ACCESS LINE:  [phone]  or [phone] [facility] [address] [place], [place] [address]  [facility]   [facility] [address]    [address] [place], [place] [address]    [place], [place] [address] [phone]      [phone]  Dr. Archer Asa     [facility] [address]    [address] [place] [address]    [place] [address] [phone]      [phone]  Therapists [facility]    [facility] [address]   [address] [place], [place] [phone]      [phone]  [facility] [address]     [address] [place] [address]    [place], [place] [phone]      [phone]   Mobile Crisis Teams [facility]    Assertive  Mobile Crisis Care Unit    [facility] [phone]     [address], [place], [place]        [phone]   Evette Cristal, [place] Assessment Counselor

## 2013-09-17 NOTE — Consult Note (Signed)
Patient needs a Depakote level, it was ordered. Patient to be monitored closely over the next 24 hours in order to make a decision if patient needs inpatient treatment

## 2014-06-12 ENCOUNTER — Encounter (HOSPITAL_COMMUNITY): Payer: Self-pay | Admitting: Emergency Medicine

## 2014-06-12 ENCOUNTER — Emergency Department (INDEPENDENT_AMBULATORY_CARE_PROVIDER_SITE_OTHER)
Admission: EM | Admit: 2014-06-12 | Discharge: 2014-06-12 | Disposition: A | Discharge status: Home / Self Care | Payer: Self-pay | Source: Home / Self Care | Attending: Family Medicine | Admitting: Family Medicine

## 2014-06-12 DIAGNOSIS — H16102 Unspecified superficial keratitis, left eye: Secondary | ICD-10-CM

## 2014-06-12 DIAGNOSIS — H16109 Unspecified superficial keratitis, unspecified eye: Secondary | ICD-10-CM

## 2014-06-12 MED ORDER — FLUOROMETHOLONE 0.1 % OP SUSP: 1.0000 [drp] | Freq: Four times a day (QID) | OPHTHALMIC | Status: DC

## 2014-06-12 MED ORDER — CIPROFLOXACIN HCL 0.3 % OP OINT: TOPICAL_OINTMENT | Freq: Two times a day (BID) | OPHTHALMIC | Status: DC

## 2014-06-12 NOTE — ED Notes (Signed)
Pt c/o poss left pink eye onset 3 weeks Sx also include: BV, watery, and redness Denies f/v/n/d, cold sx Alert w/no signs of acute distress.

## 2014-06-12 NOTE — ED Provider Notes (Signed)
CSN: [id number]     Arrival date & time 06/12/14  1303 History   First MD Initiated Contact with Patient 06/12/14 1438     Chief Complaint  Patient presents with  . Conjunctivitis   (Consider location/radiation/quality/duration/timing/severity/associated sxs/prior Treatment) Patient is a 33 y.o. male presenting with conjunctivitis. The history is provided by the patient.  Conjunctivitis This is a new problem. The current episode started more than 1 week ago (3 weeks of eye redness and progressive blurring of vision., no pain., no injury.). The problem has been gradually worsening.    Past Medical History  Diagnosis Date  . Hypertension   . Bipolar 2 disorder   . Jaw dislocation   . Depression    History reviewed. No pertinent past surgical history. No family history on file. History  Substance Use Topics  . Smoking status: Current Every Day Smoker -- 0.50 packs/day    Types: Cigarettes  . Smokeless tobacco: Not on file  . Alcohol Use: Yes     Comment: none in past 2 weeks    Review of Systems  Constitutional: Negative.   HENT: Negative.   Eyes: Positive for redness and visual disturbance. Negative for pain, discharge and itching.    Allergies  Lamictal  Home Medications   Prior to Admission medications   Medication Sig Start Date End Date Taking? Authorizing Provider  ciprofloxacin (CILOXAN) 0.3 % ophthalmic ointment Place into the left eye 2 (two) times daily. 06/12/14   Linna HoffJames D Kindl, MD  divalproex (DEPAKOTE ER) 500 MG 24 hr tablet Take 1 tablet (500 mg total) by mouth 2 (two) times daily. 09/16/13   Shuvon Rankin, NP  fluorometholone (FML) 0.1 % ophthalmic suspension Place 1 drop into the left eye 4 (four) times daily. 06/12/14   Linna HoffJames D Kindl, MD  ibuprofen (ADVIL,MOTRIN) 200 MG tablet Take 400 mg by mouth every 6 (six) hours as needed for pain.    Historical Provider, MD   BP 110/71  Pulse 82  Temp(Src) 98.4 F (36.9 C) (Oral)  Resp 17  SpO2 100% Physical  Exam  Nursing note and vitals reviewed. Constitutional: He is oriented to person, place, and time. He appears well-developed and well-nourished.  Eyes: EOM and lids are normal. Pupils are equal, round, and reactive to light. Lids are everted and swept, no foreign bodies found. Right eye exhibits no discharge. Left eye exhibits no discharge. Right conjunctiva is not injected. Left conjunctiva is injected.  Slit lamp exam:      The left eye shows fluorescein uptake.    Neck: Normal range of motion. Neck supple.  Neurological: He is alert and oriented to person, place, and time.  Skin: Skin is warm and dry.    ED Course  Procedures (including critical care time) Labs Review Labs Reviewed - No data to display  Imaging Review No results found.   MDM   1. Keratitis, superficial, left    Discussed with dr Karleen Hampshirespencer , plans as noted.    Linna HoffJames D Kindl, MD 06/15/14 1128

## 2014-06-12 NOTE — Discharge Instructions (Signed)
Use eye medicine as prescribed and see dr Karleen Hampshirespencer on mon for further eval.and care.

## 2014-07-27 ENCOUNTER — Emergency Department (HOSPITAL_COMMUNITY)
Admission: EM | Admit: 2014-07-27 | Discharge: 2014-07-27 | Disposition: A | Discharge status: Home / Self Care | Payer: Self-pay

## 2014-10-17 ENCOUNTER — Emergency Department (HOSPITAL_COMMUNITY)
Admission: EM | Admit: 2014-10-17 | Discharge: 2014-10-18 | Payer: Self-pay | Attending: Emergency Medicine | Admitting: Emergency Medicine

## 2014-10-17 DIAGNOSIS — S6992XA Unspecified injury of left wrist, hand and finger(s), initial encounter: Secondary | ICD-10-CM | POA: Insufficient documentation

## 2014-10-17 DIAGNOSIS — S61019A Laceration without foreign body of unspecified thumb without damage to nail, initial encounter: Secondary | ICD-10-CM

## 2014-10-17 DIAGNOSIS — Y92410 Unspecified street and highway as the place of occurrence of the external cause: Secondary | ICD-10-CM | POA: Insufficient documentation

## 2014-10-17 DIAGNOSIS — S199XXA Unspecified injury of neck, initial encounter: Secondary | ICD-10-CM | POA: Insufficient documentation

## 2014-10-17 DIAGNOSIS — Z79899 Other long term (current) drug therapy: Secondary | ICD-10-CM | POA: Insufficient documentation

## 2014-10-17 DIAGNOSIS — S61012A Laceration without foreign body of left thumb without damage to nail, initial encounter: Secondary | ICD-10-CM | POA: Insufficient documentation

## 2014-10-17 DIAGNOSIS — F10129 Alcohol abuse with intoxication, unspecified: Secondary | ICD-10-CM | POA: Insufficient documentation

## 2014-10-17 DIAGNOSIS — Z792 Long term (current) use of antibiotics: Secondary | ICD-10-CM | POA: Insufficient documentation

## 2014-10-17 DIAGNOSIS — Z7952 Long term (current) use of systemic steroids: Secondary | ICD-10-CM | POA: Insufficient documentation

## 2014-10-17 DIAGNOSIS — Y9389 Activity, other specified: Secondary | ICD-10-CM | POA: Insufficient documentation

## 2014-10-17 DIAGNOSIS — Z23 Encounter for immunization: Secondary | ICD-10-CM | POA: Insufficient documentation

## 2014-10-17 DIAGNOSIS — S0091XA Abrasion of unspecified part of head, initial encounter: Secondary | ICD-10-CM | POA: Insufficient documentation

## 2014-10-17 DIAGNOSIS — Z720 Tobacco use: Secondary | ICD-10-CM | POA: Insufficient documentation

## 2014-10-17 DIAGNOSIS — M25532 Pain in left wrist: Secondary | ICD-10-CM

## 2014-10-17 DIAGNOSIS — I10 Essential (primary) hypertension: Secondary | ICD-10-CM | POA: Insufficient documentation

## 2014-10-17 MED ORDER — SODIUM CHLORIDE 0.9 % IV BOLUS (SEPSIS)
1000.0000 mL | Freq: Once | INTRAVENOUS | Status: AC
  Administered 2014-10-18: 1000 mL via INTRAVENOUS

## 2014-10-17 MED ORDER — TETANUS-DIPHTH-ACELL PERTUSSIS 5-2.5-18.5 LF-MCG/0.5 IM SUSP
0.5000 mL | Freq: Once | INTRAMUSCULAR | Status: AC
  Administered 2014-10-18: 0.5 mL via INTRAMUSCULAR
  Filled 2014-10-17: qty 0.5
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
## 2014-10-18 ENCOUNTER — Emergency Department (HOSPITAL_COMMUNITY): Payer: Self-pay

## 2014-10-18 ENCOUNTER — Encounter (HOSPITAL_COMMUNITY): Payer: Self-pay | Admitting: Emergency Medicine

## 2014-10-18 LAB — BASIC METABOLIC PANEL
Anion gap: 18 — ABNORMAL HIGH (ref 5–15)
BUN: 6 mg/dL (ref 6–23)
CO2: 21 mEq/L (ref 19–32)
CREATININE: 1.21 mg/dL (ref 0.50–1.35)
Calcium: 8.7 mg/dL (ref 8.4–10.5)
Chloride: 100 mEq/L (ref 96–112)
GFR calc non Af Amer: 77 mL/min — ABNORMAL LOW (ref >90)
GFR, EST AFRICAN AMERICAN: 90 mL/min — AB (ref >90)
Glucose, Bld: 80 mg/dL (ref 70–99)
Potassium: 3.8 mEq/L (ref 3.7–5.3)
Sodium: 139 mEq/L (ref 137–147)

## 2014-10-18 LAB — CBC
HEMATOCRIT: 45.6 % (ref 39.0–52.0)
Hemoglobin: 15.8 g/dL (ref 13.0–17.0)
MCH: 31.7 pg (ref 26.0–34.0)
MCHC: 34.6 g/dL (ref 30.0–36.0)
MCV: 91.4 fL (ref 78.0–100.0)
PLATELETS: 238 10*3/uL (ref 150–400)
RBC: 4.99 MIL/uL (ref 4.22–5.81)
RDW: 14.4 % (ref 11.5–15.5)
WBC: 7 10*3/uL (ref 4.0–10.5)

## 2014-10-18 LAB — ETHANOL: Alcohol, Ethyl (B): 143 mg/dL — ABNORMAL HIGH (ref 0–11)

## 2014-10-18 MED ORDER — IBUPROFEN 800 MG PO TABS
800.0000 mg | ORAL_TABLET | Freq: Once | ORAL | Status: AC
  Administered 2014-10-18: 800 mg via ORAL

## 2014-10-18 MED ORDER — IBUPROFEN 800 MG PO TABS
800.0000 mg | ORAL_TABLET | Freq: Once | ORAL | Status: DC
  Filled 2014-10-18: qty 1

## 2014-10-18 MED ORDER — IOHEXOL 300 MG/ML  SOLN
100.0000 mL | Freq: Once | INTRAMUSCULAR | Status: AC | PRN
  Administered 2014-10-18: 100 mL via INTRAVENOUS

## 2014-10-18 MED ORDER — LIDOCAINE HCL (PF) 1 % IJ SOLN
30.0000 mL | Freq: Once | INTRAMUSCULAR | Status: AC
  Administered 2014-10-18: 30 mL
  Filled 2014-10-18: qty 30

## 2014-10-18 MED ORDER — IBUPROFEN 800 MG PO TABS: 800.0000 mg | ORAL_TABLET | Freq: Three times a day (TID) | ORAL | Status: DC

## 2014-10-18 NOTE — ED Notes (Signed)
Lac. Being irrigated and sutured by Dr. Gwendolyn GrantWalden
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
## 2014-10-18 NOTE — ED Notes (Signed)
To CT at this time.

## 2014-10-18 NOTE — ED Notes (Signed)
Pt discharged in [employer] custody, pt alert, oriented and ambulatory upon discharge. Pt verbalizes understanding of discharge instructions, 1 new prescription given.

## 2014-10-18 NOTE — ED Notes (Signed)
Return from CT
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
## 2015-10-09 ENCOUNTER — Emergency Department (HOSPITAL_BASED_OUTPATIENT_CLINIC_OR_DEPARTMENT_OTHER)
Admission: EM | Admit: 2015-10-09 | Discharge: 2015-10-09 | Disposition: A | Discharge status: Home / Self Care | Payer: Self-pay | Attending: Emergency Medicine | Admitting: Emergency Medicine

## 2015-10-09 ENCOUNTER — Encounter (HOSPITAL_BASED_OUTPATIENT_CLINIC_OR_DEPARTMENT_OTHER): Payer: Self-pay | Admitting: Emergency Medicine

## 2015-10-09 DIAGNOSIS — R51 Headache: Secondary | ICD-10-CM | POA: Insufficient documentation

## 2015-10-09 DIAGNOSIS — I10 Essential (primary) hypertension: Secondary | ICD-10-CM | POA: Insufficient documentation

## 2015-10-09 DIAGNOSIS — Z79899 Other long term (current) drug therapy: Secondary | ICD-10-CM | POA: Insufficient documentation

## 2015-10-09 DIAGNOSIS — F10920 Alcohol use, unspecified with intoxication, uncomplicated: Secondary | ICD-10-CM

## 2015-10-09 DIAGNOSIS — Z87828 Personal history of other (healed) physical injury and trauma: Secondary | ICD-10-CM | POA: Insufficient documentation

## 2015-10-09 DIAGNOSIS — Z720 Tobacco use: Secondary | ICD-10-CM | POA: Insufficient documentation

## 2015-10-09 DIAGNOSIS — F10120 Alcohol abuse with intoxication, uncomplicated: Secondary | ICD-10-CM | POA: Insufficient documentation

## 2015-10-09 MED ORDER — ACETAMINOPHEN 325 MG PO TABS
650.0000 mg | ORAL_TABLET | Freq: Once | ORAL | Status: AC
  Filled 2015-10-09: qty 2

## 2015-10-09 MED ORDER — ACETAMINOPHEN 325 MG PO TABS
650.0000 mg | ORAL_TABLET | Freq: Once | ORAL | Status: AC
  Administered 2015-10-09: 650 mg via ORAL
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
## 2015-10-09 NOTE — ED Notes (Signed)
Pt awake and oriented X4; offered food and oral fluids

## 2015-10-09 NOTE — ED Notes (Signed)
MD at bedside. 
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
## 2015-10-09 NOTE — Discharge Instructions (Signed)
Continue to take ibuprofen and tylenol as needed for pain control. Drink plenty of fluids.  Alcohol Intoxication Alcohol intoxication occurs when you drink enough alcohol that it affects your ability to function. It can be mild or very severe. Drinking a lot of alcohol in a short time is called binge drinking. This can be very harmful. Drinking alcohol can also be more dangerous if you are taking medicines or other drugs. Some of the effects caused by alcohol may include:  Loss of coordination.  Changes in mood and behavior.  Unclear thinking.  Trouble talking (slurred speech).  Throwing up (vomiting).  Confusion.  Slowed breathing.  Twitching and shaking (seizures).  Loss of consciousness. HOME CARE  Do not drive after drinking alcohol.  Drink enough water and fluids to keep your pee (urine) clear or pale yellow. Avoid caffeine.  Only take medicine as told by your doctor. GET HELP IF:  You throw up (vomit) many times.  You do not feel better after a few days.  You frequently have alcohol intoxication. Your doctor can help decide if you should see a substance use treatment counselor. GET HELP RIGHT AWAY IF:  You become shaky when you stop drinking.  You have twitching and shaking.  You throw up blood. It may look bright red or like coffee grounds.  You notice blood in your poop (bowel movements).  You become lightheaded or pass out (faint). MAKE SURE YOU:   Understand these instructions.  Will watch your condition.  Will get help right away if you are not doing well or get worse.   This information is not intended to replace advice given to you by your health care provider. Make sure you discuss any questions you have with your health care provider.   Document Released: 05/28/2008 Document Revised: 08/12/2013 Document Reviewed: 05/15/2013 Elsevier Interactive Patient Education [employer]2016 [employer]

## 2015-10-09 NOTE — ED Provider Notes (Signed)
Please see previous physicians note regarding patient's presenting history and physical, initial ED course and associated MDM. In short this is a 34 year old male who presents with headache and vertigo in the setting of alcohol intoxication. Observed in the ED until clinical sobriety. On my evaluation patient is alert, appropriate, and clinically sober. Steady on his feet, with improved symptoms. Felt appropriate for discharge home.  Lavera Guiseana Duo Kareena Arrambide, MD 10/09/15 [phone]

## 2015-10-30 ENCOUNTER — Encounter (HOSPITAL_COMMUNITY): Payer: Self-pay | Admitting: Oncology

## 2015-10-30 ENCOUNTER — Emergency Department (HOSPITAL_COMMUNITY)
Admission: EM | Admit: 2015-10-30 | Discharge: 2015-10-30 | Disposition: A | Discharge status: Home / Self Care | Payer: Self-pay | Attending: Emergency Medicine | Admitting: Emergency Medicine

## 2015-10-30 ENCOUNTER — Emergency Department (HOSPITAL_COMMUNITY): Payer: Self-pay

## 2015-10-30 DIAGNOSIS — Z79899 Other long term (current) drug therapy: Secondary | ICD-10-CM | POA: Insufficient documentation

## 2015-10-30 DIAGNOSIS — W500XXD Accidental hit or strike by another person, subsequent encounter: Secondary | ICD-10-CM | POA: Insufficient documentation

## 2015-10-30 DIAGNOSIS — S82831D Other fracture of upper and lower end of right fibula, subsequent encounter for closed fracture with routine healing: Secondary | ICD-10-CM

## 2015-10-30 DIAGNOSIS — S82401D Unspecified fracture of shaft of right fibula, subsequent encounter for closed fracture with routine healing: Secondary | ICD-10-CM | POA: Insufficient documentation

## 2015-10-30 DIAGNOSIS — I10 Essential (primary) hypertension: Secondary | ICD-10-CM | POA: Insufficient documentation

## 2015-10-30 DIAGNOSIS — F319 Bipolar disorder, unspecified: Secondary | ICD-10-CM | POA: Insufficient documentation

## 2015-10-30 DIAGNOSIS — S82301D Unspecified fracture of lower end of right tibia, subsequent encounter for closed fracture with routine healing: Secondary | ICD-10-CM | POA: Insufficient documentation

## 2015-10-30 DIAGNOSIS — Z720 Tobacco use: Secondary | ICD-10-CM | POA: Insufficient documentation

## 2015-10-30 MED ORDER — KETOROLAC TROMETHAMINE 60 MG/2ML IM SOLN
INTRAMUSCULAR | Status: AC
  Administered 2015-10-30: 60 mg
  Filled 2015-10-30: qty 2

## 2015-10-30 MED ORDER — MELOXICAM 7.5 MG PO TABS: 7.5000 mg | ORAL_TABLET | Freq: Every day | ORAL | Status: DC
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
## 2015-12-06 ENCOUNTER — Emergency Department (HOSPITAL_COMMUNITY): Payer: Self-pay

## 2015-12-06 ENCOUNTER — Emergency Department (HOSPITAL_COMMUNITY)
Admission: EM | Admit: 2015-12-06 | Discharge: 2015-12-06 | Payer: Self-pay | Attending: Emergency Medicine | Admitting: Emergency Medicine

## 2015-12-06 ENCOUNTER — Encounter (HOSPITAL_COMMUNITY): Payer: Self-pay | Admitting: Emergency Medicine

## 2015-12-06 DIAGNOSIS — S62634A Displaced fracture of distal phalanx of right ring finger, initial encounter for closed fracture: Secondary | ICD-10-CM | POA: Insufficient documentation

## 2015-12-06 DIAGNOSIS — Y9389 Activity, other specified: Secondary | ICD-10-CM | POA: Insufficient documentation

## 2015-12-06 DIAGNOSIS — Z79899 Other long term (current) drug therapy: Secondary | ICD-10-CM | POA: Insufficient documentation

## 2015-12-06 DIAGNOSIS — F17210 Nicotine dependence, cigarettes, uncomplicated: Secondary | ICD-10-CM | POA: Insufficient documentation

## 2015-12-06 DIAGNOSIS — Y92149 Unspecified place in prison as the place of occurrence of the external cause: Secondary | ICD-10-CM | POA: Insufficient documentation

## 2015-12-06 DIAGNOSIS — S62639A Displaced fracture of distal phalanx of unspecified finger, initial encounter for closed fracture: Secondary | ICD-10-CM

## 2015-12-06 DIAGNOSIS — S62632A Displaced fracture of distal phalanx of right middle finger, initial encounter for closed fracture: Secondary | ICD-10-CM | POA: Insufficient documentation

## 2015-12-06 DIAGNOSIS — Z8659 Personal history of other mental and behavioral disorders: Secondary | ICD-10-CM | POA: Insufficient documentation

## 2015-12-06 DIAGNOSIS — I10 Essential (primary) hypertension: Secondary | ICD-10-CM | POA: Insufficient documentation

## 2015-12-06 DIAGNOSIS — Y998 Other external cause status: Secondary | ICD-10-CM | POA: Insufficient documentation

## 2015-12-06 DIAGNOSIS — Z791 Long term (current) use of non-steroidal anti-inflammatories (NSAID): Secondary | ICD-10-CM | POA: Insufficient documentation

## 2015-12-06 MED ORDER — IBUPROFEN 800 MG PO TABS: 800.0000 mg | ORAL_TABLET | Freq: Three times a day (TID) | ORAL | Status: DC

## 2015-12-06 MED ORDER — IBUPROFEN 800 MG PO TABS: 800.0000 mg | ORAL_TABLET | Freq: Once | ORAL | Status: DC

## 2015-12-06 NOTE — Discharge Instructions (Signed)
Finger Fracture °Finger fractures are breaks in the bones of the fingers. There are many types of fractures. There are also different ways of treating these fractures. Your doctor will talk with you about the best way to treat your fracture. °Injury is the main cause of broken fingers. This includes: °· Injuries while playing sports. °· Workplace injuries. °· Falls. °HOME CARE °· Follow your doctor's instructions for: °¨ Activities. °¨ Exercises. °¨ Physical therapy. °· Take medicines only as told by your doctor for pain, discomfort, or fever. °GET HELP IF: °You have pain or swelling that limits: °· The motion of your fingers. °· The use of your fingers. °GET HELP RIGHT AWAY IF: °· You cannot feel your fingers, or your fingers become numb. °  °This information is not intended to replace advice given to you by your health care provider. Make sure you discuss any questions you have with your health care provider. °  °Document Released: 05/28/2008 Document Revised: 12/31/2014 Document Reviewed: 07/22/2013 °Elsevier Interactive Patient Education ©2016 Elsevier Inc. ° °
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
## 2016-11-16 ENCOUNTER — Encounter (HOSPITAL_COMMUNITY): Payer: Self-pay | Admitting: Emergency Medicine

## 2016-11-16 ENCOUNTER — Emergency Department (HOSPITAL_COMMUNITY)
Admission: EM | Admit: 2016-11-16 | Discharge: 2016-11-16 | Disposition: A | Discharge status: Home / Self Care | Payer: Self-pay | Attending: Emergency Medicine | Admitting: Emergency Medicine

## 2016-11-16 DIAGNOSIS — F17210 Nicotine dependence, cigarettes, uncomplicated: Secondary | ICD-10-CM | POA: Insufficient documentation

## 2016-11-16 DIAGNOSIS — I10 Essential (primary) hypertension: Secondary | ICD-10-CM | POA: Insufficient documentation

## 2016-11-16 DIAGNOSIS — H169 Unspecified keratitis: Secondary | ICD-10-CM | POA: Insufficient documentation

## 2016-11-16 MED ORDER — TETRACAINE HCL 0.5 % OP SOLN
1.0000 [drp] | Freq: Once | OPHTHALMIC | Status: AC
  Administered 2016-11-16: 1 [drp] via OPHTHALMIC
  Filled 2016-11-16: qty 4

## 2016-11-16 MED ORDER — FLUORESCEIN SODIUM 1 MG OP STRP
1.0000 | ORAL_STRIP | Freq: Once | OPHTHALMIC | Status: AC
  Administered 2016-11-16: 1 via OPHTHALMIC
  Filled 2016-11-16: qty 1

## 2016-11-16 NOTE — Discharge Instructions (Signed)
Please go straight to Dr. Laruth BouchardGroat's office for evaluation.

## 2016-11-16 NOTE — ED Notes (Signed)
Eye examine in process.

## 2016-11-16 NOTE — ED Provider Notes (Signed)
[facility]. On exam conjunctiva is diffusely reddened. He is able to finger count with left eye. He has no pain on extraocular movement   Doug SouSam Brandt Chaney, MD 11/16/16 1428

## 2016-11-16 NOTE — ED Triage Notes (Signed)
Pt complaint of eye redness to left eye worsening over past week. Positive for clear drainage.

## 2016-11-16 NOTE — ED Provider Notes (Signed)
[facility]-EMERGENCY DEPT Provider Note   CSN: [id number] Arrival date & time: 11/16/16  1237  By signing my name below, I, Soijett Blue, attest that this documentation has been prepared under the direction and in the presence of Burna FortsJeff Amanat Hackel, PA-C Electronically Signed: Soijett Blue, ED Scribe. 11/16/16. 8:53 PM.   History   Chief Complaint Chief Complaint  Patient presents with  . Conjunctivitis    HPI Dustin Marquez is a 35 y.o. male who presents to the Emergency Department complaining of gradually worsening left eye redness onset 1 week ago. Pt denies being sick recently or having symptoms like this in the past. Pt denies wearing contacts at this time. Pt states that he has a PMHx of keratitis to his left eye that he was evaluated by an eye doctor and Rx drops. He states that he is having associated symptoms of clear eye discharge to left eye, watery left eye x last night, photophobia, blurred vision to left eye. He states that he has not tried any medications for the relief for his symptoms. He denies left eye itching, fever, chills, and any other symptoms.   The history is provided by the patient. No language interpreter was used.    Past Medical History:  Diagnosis Date  . Bipolar 2 disorder (HCC)   . Depression   . Hypertension   . Jaw dislocation     Patient Active Problem List   Diagnosis Date Noted  . Bipolar affective disorder, depressed, severe (HCC) 05/05/2013    Class: Acute  . Cocaine abuse 05/05/2013    Past Surgical History:  Procedure Laterality Date  . FRACTURE SURGERY         Home Medications    Prior to Admission medications   Medication Sig Start Date End Date Taking? Authorizing Provider  divalproex (DEPAKOTE ER) 500 MG 24 hr tablet Take 1 tablet (500 mg total) by mouth 2 (two) times daily. Patient taking differently: Take 500 mg by mouth at bedtime.  09/16/13   Shuvon B Rankin, NP  ibuprofen (ADVIL,MOTRIN) 800 MG tablet Take 1 tablet (800 mg  total) by mouth 3 (three) times daily. 12/06/15   Elpidio AnisShari Upstill, PA-C  meloxicam (MOBIC) 7.5 MG tablet Take 1 tablet (7.5 mg total) by mouth daily. 10/30/15   April Palumbo, MD    Family History No family history on file.  Social History Social History  Substance Use Topics  . Smoking status: Current Every Day Smoker    Packs/day: 0.50    Types: Cigarettes  . Smokeless tobacco: Not on file  . Alcohol use No     Allergies   Lamictal [lamotrigine]   Review of Systems Review of Systems  Constitutional: Negative for chills and fever.  Eyes: Positive for photophobia, pain (left), discharge (clear to left eye), redness (left) and visual disturbance (blurred vision to left eye). Negative for itching.     Physical Exam Updated Vital Signs BP 126/75   Pulse 80   Temp 98.4 F (36.9 C)   Resp 16   SpO2 99%   Physical Exam  Constitutional: He is oriented to person, place, and time. He appears well-developed and well-nourished. No distress.  HENT:  Head: Normocephalic and atraumatic.  Eyes: EOM are normal. Pupils are equal, round, and reactive to light. Left conjunctiva is injected.  Left eye: Hazy cornea. Bulbar injection. No surrounding cellulitis.   Neck: Neck supple.  Cardiovascular: Normal rate.   Pulmonary/Chest: Effort normal. No respiratory distress.  Abdominal: He exhibits no distension.  Musculoskeletal: Normal range of motion.  Neurological: He is alert and oriented to person, place, and time.  Skin: Skin is warm and dry.  Psychiatric: He has a normal mood and affect. His behavior is normal.  Nursing note and vitals reviewed.    ED Treatments / Results  DIAGNOSTIC STUDIES: Oxygen Saturation is 99% on RA, nl by my interpretation.    COORDINATION OF CARE:    Procedures Procedures (including critical care time)  Medications Ordered in ED Medications  fluorescein ophthalmic strip 1 strip (1 strip Left Eye Given 11/16/16 1350)  tetracaine (PONTOCAINE) 0.5  % ophthalmic solution 1 drop (1 drop Left Eye Given 11/16/16 1350)     Initial Impression / Assessment and Plan / ED Course  I have reviewed the triage vital signs and the nursing notes.   Clinical Course      Labs:   Imaging:   Consults:   Therapeutics: Visual acuity, fluorescein,    Discharge Meds:   Assessment/Plan:   35 year old male presents today with concern for keratitis. They spoke with on-call ophthalmologist Dr. Dione BoozeGroat. Like to see the patient at his clinic today. Patient was discharged with close follow-up with Dr. Dione BoozeGroat.    Final Clinical Impressions(s) / ED Diagnoses   Final diagnoses:  Keratitis    New Prescriptions Discharge Medication List as of 11/16/2016  2:28 PM     I personally performed the services described in this documentation, which was scribed in my presence. The recorded information has been reviewed and is accurate.    Eyvonne MechanicJeffrey Babetta Paterson, PA-C 11/26/16 2053    Charlynne Panderavid Hsienta Yao, MD 11/27/16 [phone]

## 2017-09-06 ENCOUNTER — Inpatient Hospital Stay (HOSPITAL_COMMUNITY): Payer: Medicaid Other

## 2017-09-06 ENCOUNTER — Emergency Department (HOSPITAL_COMMUNITY): Payer: Medicaid Other

## 2017-09-06 ENCOUNTER — Encounter (HOSPITAL_COMMUNITY): Admission: EM | Disposition: A | Discharge status: Skilled Nursing Facility | Payer: Self-pay | Source: Home / Self Care

## 2017-09-06 ENCOUNTER — Inpatient Hospital Stay (HOSPITAL_COMMUNITY)
Admission: EM | Admit: 2017-09-06 | Discharge: 2017-11-12 | DRG: 957 | Disposition: A | Discharge status: Skilled Nursing Facility | Payer: Medicaid Other | Attending: General Surgery | Admitting: General Surgery

## 2017-09-06 ENCOUNTER — Encounter (HOSPITAL_COMMUNITY): Payer: Self-pay | Admitting: Emergency Medicine

## 2017-09-06 DIAGNOSIS — S51011A Laceration without foreign body of right elbow, initial encounter: Secondary | ICD-10-CM | POA: Diagnosis present

## 2017-09-06 DIAGNOSIS — I63411 Cerebral infarction due to embolism of right middle cerebral artery: Secondary | ICD-10-CM

## 2017-09-06 DIAGNOSIS — S82409A Unspecified fracture of shaft of unspecified fibula, initial encounter for closed fracture: Secondary | ICD-10-CM

## 2017-09-06 DIAGNOSIS — H53462 Homonymous bilateral field defects, left side: Secondary | ICD-10-CM | POA: Diagnosis present

## 2017-09-06 DIAGNOSIS — S12500S Unspecified displaced fracture of sixth cervical vertebra, sequela: Secondary | ICD-10-CM | POA: Diagnosis not present

## 2017-09-06 DIAGNOSIS — S0181XA Laceration without foreign body of other part of head, initial encounter: Secondary | ICD-10-CM | POA: Diagnosis present

## 2017-09-06 DIAGNOSIS — S064X9A Epidural hemorrhage with loss of consciousness of unspecified duration, initial encounter: Principal | ICD-10-CM | POA: Diagnosis present

## 2017-09-06 DIAGNOSIS — F319 Bipolar disorder, unspecified: Secondary | ICD-10-CM | POA: Diagnosis present

## 2017-09-06 DIAGNOSIS — Z9689 Presence of other specified functional implants: Secondary | ICD-10-CM

## 2017-09-06 DIAGNOSIS — R402361 Coma scale, best motor response, obeys commands, in the field [EMT or ambulance]: Secondary | ICD-10-CM | POA: Diagnosis present

## 2017-09-06 DIAGNOSIS — Z8709 Personal history of other diseases of the respiratory system: Secondary | ICD-10-CM

## 2017-09-06 DIAGNOSIS — T1490XA Injury, unspecified, initial encounter: Secondary | ICD-10-CM

## 2017-09-06 DIAGNOSIS — J9690 Respiratory failure, unspecified, unspecified whether with hypoxia or hypercapnia: Secondary | ICD-10-CM

## 2017-09-06 DIAGNOSIS — G936 Cerebral edema: Secondary | ICD-10-CM

## 2017-09-06 DIAGNOSIS — S82871A Displaced pilon fracture of right tibia, initial encounter for closed fracture: Secondary | ICD-10-CM | POA: Diagnosis present

## 2017-09-06 DIAGNOSIS — R0902 Hypoxemia: Secondary | ICD-10-CM

## 2017-09-06 DIAGNOSIS — S140XXA Concussion and edema of cervical spinal cord, initial encounter: Secondary | ICD-10-CM | POA: Diagnosis present

## 2017-09-06 DIAGNOSIS — R238 Other skin changes: Secondary | ICD-10-CM

## 2017-09-06 DIAGNOSIS — T148XXA Other injury of unspecified body region, initial encounter: Secondary | ICD-10-CM | POA: Diagnosis present

## 2017-09-06 DIAGNOSIS — S12500A Unspecified displaced fracture of sixth cervical vertebra, initial encounter for closed fracture: Secondary | ICD-10-CM | POA: Diagnosis present

## 2017-09-06 DIAGNOSIS — S0101XA Laceration without foreign body of scalp, initial encounter: Secondary | ICD-10-CM | POA: Diagnosis present

## 2017-09-06 DIAGNOSIS — D62 Acute posthemorrhagic anemia: Secondary | ICD-10-CM | POA: Diagnosis not present

## 2017-09-06 DIAGNOSIS — S51012A Laceration without foreign body of left elbow, initial encounter: Secondary | ICD-10-CM | POA: Diagnosis present

## 2017-09-06 DIAGNOSIS — S82402A Unspecified fracture of shaft of left fibula, initial encounter for closed fracture: Secondary | ICD-10-CM | POA: Diagnosis present

## 2017-09-06 DIAGNOSIS — R402241 Coma scale, best verbal response, confused conversation, in the field [EMT or ambulance]: Secondary | ICD-10-CM | POA: Diagnosis present

## 2017-09-06 DIAGNOSIS — R402131 Coma scale, eyes open, to sound, in the field [EMT or ambulance]: Secondary | ICD-10-CM | POA: Diagnosis present

## 2017-09-06 DIAGNOSIS — S2232XA Fracture of one rib, left side, initial encounter for closed fracture: Secondary | ICD-10-CM | POA: Diagnosis present

## 2017-09-06 DIAGNOSIS — G8194 Hemiplegia, unspecified affecting left nondominant side: Secondary | ICD-10-CM

## 2017-09-06 DIAGNOSIS — T8140XA Infection following a procedure, unspecified, initial encounter: Secondary | ICD-10-CM | POA: Diagnosis not present

## 2017-09-06 DIAGNOSIS — Z419 Encounter for procedure for purposes other than remedying health state, unspecified: Secondary | ICD-10-CM

## 2017-09-06 DIAGNOSIS — Y838 Other surgical procedures as the cause of abnormal reaction of the patient, or of later complication, without mention of misadventure at the time of the procedure: Secondary | ICD-10-CM | POA: Diagnosis not present

## 2017-09-06 DIAGNOSIS — T847XXS Infection and inflammatory reaction due to other internal orthopedic prosthetic devices, implants and grafts, sequela: Secondary | ICD-10-CM | POA: Diagnosis not present

## 2017-09-06 DIAGNOSIS — Z09 Encounter for follow-up examination after completed treatment for conditions other than malignant neoplasm: Secondary | ICD-10-CM

## 2017-09-06 DIAGNOSIS — R578 Other shock: Secondary | ICD-10-CM | POA: Diagnosis present

## 2017-09-06 DIAGNOSIS — Y92239 Unspecified place in hospital as the place of occurrence of the external cause: Secondary | ICD-10-CM | POA: Diagnosis not present

## 2017-09-06 DIAGNOSIS — S82209A Unspecified fracture of shaft of unspecified tibia, initial encounter for closed fracture: Secondary | ICD-10-CM

## 2017-09-06 DIAGNOSIS — Z4659 Encounter for fitting and adjustment of other gastrointestinal appliance and device: Secondary | ICD-10-CM

## 2017-09-06 DIAGNOSIS — Y92410 Unspecified street and highway as the place of occurrence of the external cause: Secondary | ICD-10-CM | POA: Diagnosis not present

## 2017-09-06 DIAGNOSIS — R52 Pain, unspecified: Secondary | ICD-10-CM

## 2017-09-06 DIAGNOSIS — Z8781 Personal history of (healed) traumatic fracture: Secondary | ICD-10-CM

## 2017-09-06 DIAGNOSIS — R233 Spontaneous ecchymoses: Secondary | ICD-10-CM

## 2017-09-06 DIAGNOSIS — T847XXA Infection and inflammatory reaction due to other internal orthopedic prosthetic devices, implants and grafts, initial encounter: Secondary | ICD-10-CM

## 2017-09-06 DIAGNOSIS — I7771 Dissection of carotid artery: Secondary | ICD-10-CM | POA: Diagnosis present

## 2017-09-06 DIAGNOSIS — F3170 Bipolar disorder, currently in remission, most recent episode unspecified: Secondary | ICD-10-CM | POA: Diagnosis not present

## 2017-09-06 HISTORY — PX: IR ANGIO VERTEBRAL SEL SUBCLAVIAN INNOMINATE BILAT MOD SED: IMG5366

## 2017-09-06 HISTORY — PX: IR ANGIO INTRA EXTRACRAN SEL COM CAROTID INNOMINATE BILAT MOD SED: IMG5360

## 2017-09-06 HISTORY — PX: IR ANGIO EXTERNAL CAROTID SEL EXT CAROTID UNI L MOD SED: IMG5370

## 2017-09-06 LAB — CBC WITH DIFFERENTIAL/PLATELET
BASOS PCT: 0 %
Basophils Absolute: 0 10*3/uL (ref 0.0–0.1)
Basophils Absolute: 0 10*3/uL (ref 0.0–0.1)
Basophils Relative: 0 %
EOS ABS: 0 10*3/uL (ref 0.0–0.7)
EOS PCT: 0 %
EOS PCT: 0 %
Eosinophils Absolute: 0.1 10*3/uL (ref 0.0–0.7)
HCT: 41.7 % (ref 39.0–52.0)
HCT: 34.8 % — ABNORMAL LOW (ref 39.0–52.0)
HEMOGLOBIN: 13.6 g/dL (ref 13.0–17.0)
Hemoglobin: 11.7 g/dL — ABNORMAL LOW (ref 13.0–17.0)
LYMPHS ABS: 1.4 10*3/uL (ref 0.7–4.0)
LYMPHS ABS: 1.4 10*3/uL (ref 0.7–4.0)
LYMPHS PCT: 8 %
Lymphocytes Relative: 15 %
MCH: 29.8 pg (ref 26.0–34.0)
MCH: 30.2 pg (ref 26.0–34.0)
MCHC: 32.6 g/dL (ref 30.0–36.0)
MCHC: 33.6 g/dL (ref 30.0–36.0)
MCV: 91.4 fL (ref 78.0–100.0)
MCV: 89.7 fL (ref 78.0–100.0)
MONO ABS: 0.8 10*3/uL (ref 0.1–1.0)
MONOS PCT: 5 %
MONOS PCT: 8 %
Monocytes Absolute: 0.9 10*3/uL (ref 0.1–1.0)
NEUTROS PCT: 77 %
Neutro Abs: 15.6 10*3/uL — ABNORMAL HIGH (ref 1.7–7.7)
Neutro Abs: 7.4 10*3/uL (ref 1.7–7.7)
Neutrophils Relative %: 87 %
PLATELETS: 189 10*3/uL (ref 150–400)
PLATELETS: 122 10*3/uL — AB (ref 150–400)
RBC: 4.56 MIL/uL (ref 4.22–5.81)
RBC: 3.88 MIL/uL — ABNORMAL LOW (ref 4.22–5.81)
RDW: 14.1 % (ref 11.5–15.5)
RDW: 15.3 % (ref 11.5–15.5)
WBC: 18 10*3/uL — AB (ref 4.0–10.5)
WBC: 9.6 10*3/uL (ref 4.0–10.5)

## 2017-09-06 LAB — RAPID URINE DRUG SCREEN, HOSP PERFORMED
Amphetamines: NOT DETECTED
BARBITURATES: NOT DETECTED
Benzodiazepines: NOT DETECTED
COCAINE: NOT DETECTED
OPIATES: NOT DETECTED
Tetrahydrocannabinol: NOT DETECTED

## 2017-09-06 LAB — URINALYSIS, ROUTINE W REFLEX MICROSCOPIC
Bilirubin Urine: NEGATIVE
Glucose, UA: NEGATIVE mg/dL
KETONES UR: NEGATIVE mg/dL
LEUKOCYTES UA: NEGATIVE
Nitrite: NEGATIVE
PROTEIN: 100 mg/dL — AB
Specific Gravity, Urine: 1.017 (ref 1.005–1.030)
pH: 8 (ref 5.0–8.0)

## 2017-09-06 LAB — BLOOD GAS, ARTERIAL
ACID-BASE DEFICIT: 6.9 mmol/L — AB (ref 0.0–2.0)
Bicarbonate: 17.5 mmol/L — ABNORMAL LOW (ref 20.0–28.0)
Drawn by: 205171
FIO2: 40
LHR: 18 {breaths}/min
O2 SAT: 99 %
PATIENT TEMPERATURE: 98.6
PCO2 ART: 31.9 mmHg — AB (ref 32.0–48.0)
PEEP/CPAP: 5 cmH2O
PH ART: 7.359 (ref 7.350–7.450)
VT: 620 mL
pO2, Arterial: 209 mmHg — ABNORMAL HIGH (ref 83.0–108.0)

## 2017-09-06 LAB — BASIC METABOLIC PANEL
ANION GAP: 6 (ref 5–15)
Anion gap: 9 (ref 5–15)
BUN: 13 mg/dL (ref 6–20)
BUN: 13 mg/dL (ref 6–20)
CALCIUM: 7.5 mg/dL — AB (ref 8.9–10.3)
CHLORIDE: 109 mmol/L (ref 101–111)
CO2: 21 mmol/L — AB (ref 22–32)
CO2: 16 mmol/L — AB (ref 22–32)
CREATININE: 1.54 mg/dL — AB (ref 0.61–1.24)
CREATININE: 1.33 mg/dL — AB (ref 0.61–1.24)
Calcium: 8.4 mg/dL — ABNORMAL LOW (ref 8.9–10.3)
Chloride: 120 mmol/L — ABNORMAL HIGH (ref 101–111)
GFR calc Af Amer: >60 mL/min (ref >60)
GFR calc non Af Amer: 57 mL/min — ABNORMAL LOW (ref >60)
Glucose, Bld: 129 mg/dL — ABNORMAL HIGH (ref 65–99)
Glucose, Bld: 88 mg/dL (ref 65–99)
POTASSIUM: 3.4 mmol/L — AB (ref 3.5–5.1)
Potassium: 3.8 mmol/L (ref 3.5–5.1)
SODIUM: 139 mmol/L (ref 135–145)
Sodium: 142 mmol/L (ref 135–145)

## 2017-09-06 LAB — LACTIC ACID, PLASMA: LACTIC ACID, VENOUS: 2.7 mmol/L — AB (ref 0.5–1.9)

## 2017-09-06 LAB — GLUCOSE, CAPILLARY
GLUCOSE-CAPILLARY: 82 mg/dL (ref 65–99)
Glucose-Capillary: 80 mg/dL (ref 65–99)

## 2017-09-06 LAB — ABO/RH: ABO/RH(D): A POS

## 2017-09-06 LAB — I-STAT ARTERIAL BLOOD GAS, ED
Acid-base deficit: 3 mmol/L — ABNORMAL HIGH (ref 0.0–2.0)
Bicarbonate: 21.4 mmol/L (ref 20.0–28.0)
O2 Saturation: 100 %
PCO2 ART: 36.3 mmHg (ref 32.0–48.0)
PH ART: 7.378 (ref 7.350–7.450)
PO2 ART: 554 mmHg — AB (ref 83.0–108.0)
Patient temperature: 98.6
TCO2: 22 mmol/L (ref 22–32)

## 2017-09-06 LAB — I-STAT CHEM 8, ED
BUN: 18 mg/dL (ref 6–20)
CALCIUM ION: 0.96 mmol/L — AB (ref 1.15–1.40)
CHLORIDE: 109 mmol/L (ref 101–111)
Creatinine, Ser: 1.4 mg/dL — ABNORMAL HIGH (ref 0.61–1.24)
GLUCOSE: 125 mg/dL — AB (ref 65–99)
HCT: 43 % (ref 39.0–52.0)
Hemoglobin: 14.6 g/dL (ref 13.0–17.0)
Potassium: 3.3 mmol/L — ABNORMAL LOW (ref 3.5–5.1)
Sodium: 143 mmol/L (ref 135–145)
TCO2: 20 mmol/L — AB (ref 22–32)

## 2017-09-06 LAB — ETHANOL: Alcohol, Ethyl (B): <5 mg/dL (ref <5)

## 2017-09-06 LAB — BLOOD PRODUCT ORDER (VERBAL) VERIFICATION

## 2017-09-06 LAB — MRSA PCR SCREENING: MRSA BY PCR: NEGATIVE

## 2017-09-06 LAB — I-STAT CG4 LACTIC ACID, ED: Lactic Acid, Venous: 3.94 mmol/L (ref 0.5–1.9)

## 2017-09-06 LAB — TRIGLYCERIDES: Triglycerides: 41 mg/dL (ref <150)

## 2017-09-06 SURGERY — RADIOLOGY WITH ANESTHESIA
Anesthesia: Choice

## 2017-09-06 MED ORDER — ASPIRIN 325 MG PO TABS
325.0000 mg | ORAL_TABLET | Freq: Every day | ORAL | Status: DC
  Administered 2017-09-06 – 2017-09-11 (×6): 325 mg via ORAL
  Filled 2017-09-06 (×6): qty 1

## 2017-09-06 MED ORDER — PROPOFOL 1000 MG/100ML IV EMUL
INTRAVENOUS | Status: AC
  Filled 2017-09-06: qty 100

## 2017-09-06 MED ORDER — IOPAMIDOL (ISOVUE-300) INJECTION 61%
INTRAVENOUS | Status: AC
Start: 2017-09-06 — End: 2017-09-06
  Administered 2017-09-06: 110 mL
  Filled 2017-09-06: qty 150

## 2017-09-06 MED ORDER — SODIUM CHLORIDE 0.9 % IV SOLN
500.0000 mL | Freq: Once | INTRAVENOUS | Status: AC
  Administered 2017-09-06: 500 mL via INTRAVENOUS

## 2017-09-06 MED ORDER — POTASSIUM CHLORIDE IN NACL 20-0.9 MEQ/L-% IV SOLN
INTRAVENOUS | Status: DC
  Administered 2017-09-06: 11:00:00 via INTRAVENOUS
  Administered 2017-09-06: 100 mL/h via INTRAVENOUS
  Administered 2017-09-07: 16:00:00 via INTRAVENOUS
  Administered 2017-09-07: 100 mL/h via INTRAVENOUS
  Administered 2017-09-08 – 2017-09-23 (×28): via INTRAVENOUS
  Filled 2017-09-06 (×45): qty 1000

## 2017-09-06 MED ORDER — ORAL CARE MOUTH RINSE
15.0000 mL | OROMUCOSAL | Status: DC
  Administered 2017-09-06 – 2017-09-11 (×54): 15 mL via OROMUCOSAL

## 2017-09-06 MED ORDER — DOCUSATE SODIUM 50 MG/5ML PO LIQD
100.0000 mg | Freq: Two times a day (BID) | ORAL | Status: DC | PRN
  Administered 2017-09-12 – 2017-09-13 (×2): 100 mg
  Filled 2017-09-06 (×4): qty 10

## 2017-09-06 MED ORDER — FENTANYL BOLUS VIA INFUSION
50.0000 ug | INTRAVENOUS | Status: DC | PRN
  Administered 2017-09-06 (×2): 25 ug via INTRAVENOUS
  Administered 2017-09-06 – 2017-09-10 (×2): 50 ug via INTRAVENOUS
  Filled 2017-09-06: qty 50

## 2017-09-06 MED ORDER — IOPAMIDOL (ISOVUE-300) INJECTION 61%
INTRAVENOUS | Status: AC
  Administered 2017-09-06: 02:00:00
  Filled 2017-09-06: qty 100

## 2017-09-06 MED ORDER — IOPAMIDOL (ISOVUE-370) INJECTION 76%
INTRAVENOUS | Status: AC
  Administered 2017-09-06: 03:00:00
  Filled 2017-09-06: qty 50

## 2017-09-06 MED ORDER — PROPOFOL 1000 MG/100ML IV EMUL
0.0000 ug/kg/min | INTRAVENOUS | Status: DC
  Administered 2017-09-06: 10 ug/kg/min via INTRAVENOUS
  Administered 2017-09-07: 8.578 ug/kg/min via INTRAVENOUS
  Filled 2017-09-06: qty 100

## 2017-09-06 MED ORDER — ASPIRIN 325 MG PO TABS
650.0000 mg | ORAL_TABLET | Freq: Once | ORAL | Status: AC
  Administered 2017-09-06: 650 mg via NASOGASTRIC
  Filled 2017-09-06: qty 2

## 2017-09-06 MED ORDER — TETANUS-DIPHTH-ACELL PERTUSSIS 5-2.5-18.5 LF-MCG/0.5 IM SUSP
INTRAMUSCULAR | Status: AC
  Administered 2017-09-06: 0.5 mL via INTRAMUSCULAR
  Filled 2017-09-06: qty 0.5

## 2017-09-06 MED ORDER — PANTOPRAZOLE SODIUM 40 MG PO TBEC: 40.0000 mg | DELAYED_RELEASE_TABLET | Freq: Every day | ORAL | Status: DC

## 2017-09-06 MED ORDER — PROPOFOL 1000 MG/100ML IV EMUL
INTRAVENOUS | Status: AC
  Administered 2017-09-06: 25 ug/kg/min via INTRAVENOUS
  Filled 2017-09-06: qty 100

## 2017-09-06 MED ORDER — ETOMIDATE 2 MG/ML IV SOLN
INTRAVENOUS | Status: AC | PRN
  Administered 2017-09-06: 20 mg via INTRAVENOUS

## 2017-09-06 MED ORDER — LIDOCAINE HCL (PF) 1 % IJ SOLN
INTRAMUSCULAR | Status: AC
  Filled 2017-09-06: qty 30

## 2017-09-06 MED ORDER — ALBUMIN HUMAN 5 % IV SOLN
25.0000 g | Freq: Once | INTRAVENOUS | Status: AC
  Administered 2017-09-06: 25 g via INTRAVENOUS
  Filled 2017-09-06: qty 250

## 2017-09-06 MED ORDER — MIDAZOLAM HCL 2 MG/2ML IJ SOLN
INTRAMUSCULAR | Status: AC
  Filled 2017-09-06: qty 4

## 2017-09-06 MED ORDER — PANTOPRAZOLE SODIUM 40 MG IV SOLR
40.0000 mg | Freq: Every day | INTRAVENOUS | Status: DC
  Administered 2017-09-06 – 2017-09-14 (×9): 40 mg via INTRAVENOUS
  Filled 2017-09-06 (×9): qty 40

## 2017-09-06 MED ORDER — ROCURONIUM BROMIDE 50 MG/5ML IV SOLN
INTRAVENOUS | Status: AC | PRN
  Administered 2017-09-06: 100 mg via INTRAVENOUS

## 2017-09-06 MED ORDER — MIDAZOLAM HCL 2 MG/2ML IJ SOLN
INTRAMUSCULAR | Status: AC | PRN
  Administered 2017-09-06: 1 mg via INTRAVENOUS

## 2017-09-06 MED ORDER — FENTANYL 2500MCG IN NS 250ML (10MCG/ML) PREMIX INFUSION
25.0000 ug/h | INTRAVENOUS | Status: DC
  Administered 2017-09-06: 50 ug/h via INTRAVENOUS
  Administered 2017-09-07 – 2017-09-11 (×4): 75 ug/h via INTRAVENOUS
  Filled 2017-09-06 (×5): qty 250

## 2017-09-06 MED ORDER — SODIUM CHLORIDE 0.9 % IV SOLN
INTRAVENOUS | Status: AC | PRN
  Administered 2017-09-06 (×3): 1000 mL via INTRAVENOUS

## 2017-09-06 MED ORDER — ONDANSETRON 4 MG PO TBDP
4.0000 mg | ORAL_TABLET | Freq: Four times a day (QID) | ORAL | Status: DC | PRN
  Filled 2017-09-06: qty 1

## 2017-09-06 MED ORDER — CHLORHEXIDINE GLUCONATE 0.12% ORAL RINSE (MEDLINE KIT)
15.0000 mL | Freq: Two times a day (BID) | OROMUCOSAL | Status: DC
  Administered 2017-09-06 – 2017-09-14 (×17): 15 mL via OROMUCOSAL

## 2017-09-06 MED ORDER — ONDANSETRON HCL 4 MG/2ML IJ SOLN: 4.0000 mg | Freq: Four times a day (QID) | INTRAMUSCULAR | Status: DC | PRN

## 2017-09-06 MED ORDER — FENTANYL CITRATE (PF) 100 MCG/2ML IJ SOLN
50.0000 ug | Freq: Once | INTRAMUSCULAR | Status: AC
  Administered 2017-09-06: 50 ug via INTRAVENOUS
  Filled 2017-09-06: qty 2

## 2017-09-06 MED ORDER — SUCCINYLCHOLINE CHLORIDE 20 MG/ML IJ SOLN
INTRAMUSCULAR | Status: AC | PRN
  Administered 2017-09-06: 150 mg via INTRAVENOUS

## 2017-09-06 MED ORDER — LIDOCAINE HCL (PF) 1 % IJ SOLN
INTRAMUSCULAR | Status: AC | PRN
  Administered 2017-09-06: 8 mL
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
## 2017-09-06 NOTE — ED Notes (Signed)
Patient taken to IR at this time via RT, IR RNs and this RN.

## 2017-09-06 NOTE — Progress Notes (Signed)
Nurse has seen the patient move all fours.  Did move his right foot with stimulation for me.  Nothing to command.  BP is lower than the goals listed by IR.  Still has a MAP of 70.  May need to go on Levophed to met BP goals.  All previous labs are from 0200 today.  Will repeat all labs.  Kathryne Eriksson. Dahlia Bailiff, MD, [place] [phone] Trauma Surgeon

## 2017-09-06 NOTE — Procedures (Signed)
FAST  Pre-procedure diagnosis: PHBC Post-procedure diagnosis: No hemoperitoneum, no pericardial effusion Procedure: FAST Surgeon: Violeta Gelinas, MD Procedure in detail: The patient's abdomen was imaged in 4 regions with the ultrasound. First, the right upper quadrant was imaged. No free fluid was seen between the right kidney and the liver in Morison's pouch. Next, the epigastrium was imaged. No significant pericardial effusion was seen. Next, the left upper quadrant was imaged. No free fluid was seen between the left kidney and the spleen. Finally, the bladder was imaged. No free fluid was seen next to the bladder in the pelvis. Impression: Negative  Violeta Gelinas, MD, MPH, FACS Trauma: [phone] General Surgery: [phone]
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
## 2017-09-06 NOTE — ED Notes (Signed)
Patient returned from CT
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
## 2017-09-06 NOTE — Progress Notes (Addendum)
Orthopedic Tech Progress Note Patient Details:  Dustin Marquez 01/07/1981 [id number]  Ortho Devices Type of Ortho Device: Post (long leg) splint, Stirrup splint Ortho Device/Splint Location: rle Ortho Device/Splint Interventions: Ordered, Application, Adjustment Applied as per drs verbal order.  [name] 09/06/2017, 6:42 AM
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
## 2017-09-06 NOTE — ED Notes (Signed)
Patient awaiting to go to IR at this time.

## 2017-09-06 NOTE — Consult Note (Signed)
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 procedure Intra procedural IV Versed 1 mg ANESTHESIA/SEDATION: Previous intubation with propofol sedation The patient was continuously monitored during the procedure by the interventional radiology nurse under my direct supervision. CONTRAST:  110 cc Isovue-300 FLUOROSCOPY TIME:  Fluoroscopy Time: 7 minutes 24 seconds (2029 mGy). COMPLICATIONS: None TECHNIQUE: Emergent consent was presumed given the emergent nature  of the the acute  neurologic condition, and the absence of any known family members. Maximal Sterile Barrier Technique was utilized including caps, mask, sterile gowns, sterile gloves, sterile drape, hand hygiene and skin antiseptic. A timeout was performed prior to the initiation of the procedure. FINDINGS: Right common carotid artery:  Normal course caliber and contour. Right external carotid artery: Patent with antegrade flow. Standing waves of the proximal internal maxillary artery. Mild luminal irregularity involving the occipital artery at the skullbase, potentially vasospasm or standing waist. Right internal carotid artery: Tapered luminal narrowing of the proximal right cervical ICA extending from just beyond the origin to the distal third of the cervical segment. Irregular filling defect at the anterior aspect of the proximal ICA, compatible with origin of dissection flap. Cervical ICA remains patent with stenosis at the distal aspect of the luminal filling defect. The affected segment measures approximately 6.5 cm. The distal cervical segment, vertical petrous ICA and horizontal petrous ICA with normal caliber and no intimal irregularity. Antegrade flow maintained, with no retrograde washout. No central filling defect. Unremarkable course caliber and contour of the horizontal petrous segment, lacerum segment, cavernous segment, ophthalmic segment and terminal segment. Right MCA: There is some un-opacified slip-streaming artifact from patent posterior communicating artery into the patent MCA. Patent M1 and M2 branches. Maintained flow within frontal and parietal MCA branches. No delayed angiographic phase/cortical face. Unremarkable venous washout phase. Right ACA: A 1 segment patent, with right A2 perfusing the right territory. Right vertebral artery: Unremarkable course caliber and contour of the cervical vertebral artery. Patent posterior inferior cerebellar artery and anterior inferior cerebellar artery.  Basilar artery patent with antegrade flow and significant on opacified inflow from the contralateral vertebral artery. Patent posterior cerebral artery with flow towards the anterior circulation Unremarkable venous phase. Left common carotid artery:  Normal course caliber and contour. Left external carotid artery: Patent with antegrade flow. Selective injection of the proximal external carotid artery demonstrates no pseudoaneurysm, extravasation, contrast pooling. Left internal carotid artery: Intimal irregularity of the cervical segment of the left internal cerebral artery originating in the mid third, measuring approximately 5 cm in length. Less than 25% narrowing, with no evidence of pseudoaneurysm. The irregular intimal contour extends into the vertical petrous segment. The horizontal petrous segment, lacerum segment, cavernous segment, supraclinoid and terminus within normal limits with no filling defect or significant luminal irregularity. No significant cross fill to the right hemisphere, although there is clearly patent anterior communicating artery, with some opacification of the right A2 and ACA territory. Left MCA: M1 segment patent. Insular and opercular segments patent. Unremarkable caliber and course of the cortical segments. Typical arterial, capillary/ parenchymal, and venous phase. Left ACA:  A 1 segment patent.  Patent anterior communicating artery Left vertebral artery: Minimal irregularity of the proximal left vertebral artery with no significant stenosis. No luminal filling defect. The left vertebral artery is the dominant artery with significant filling of the basilar artery and retrograde opacification of the right vertebral artery at the vertebrobasilar junction. Bilateral posterior cerebral arteries patent. Left superior cerebellar artery patent. Posterior inferior cerebellar artery and anterior inferior cerebellar artery patent. Unremarkable appearance of the venous washout. PROCEDURE:  Patient is brought to the IR suite and placed on the table in supine position. Timeout was performed to confirm the correct patient, procedure, and the existence of emergency consent. The patient was then prepped and draped in the usual sterile fashion. 1% lidocaine was used for local anesthesia. Ultrasound survey of the right inguinal region was performed with images stored  and sent to PACs. A micropuncture needle was used access the right common femoral artery under ultrasound. With excellent arterial blood flow returned, and an .018 micro wire was passed through the needle, observed enter the abdominal aorta under fluoroscopy. The needle was removed, and a micropuncture sheath was placed over the wire. The inner dilator and wire were removed, and an 035 J wire was advanced under fluoroscopy into the abdominal aorta. The sheath was removed and a standard 5 [place] vascular sheath was placed. The dilator was removed and the sheath was flushed. J wire and the JB catheter were advanced to the proximal descending thoracic aorta. Wire was removed. Double flush was performed. Innominate artery was then selected, with the catheter positioned into the right common carotid artery. Angiogram was performed. Catheter was then withdrawn to the innominate artery, and double flush was performed. Using 035 guidewire, the origin of the vertebral artery was selected. Formal angiogram was performed. Catheter was withdrawn into the descending aorta. Double flush was performed. Innominate artery was then selected, catheter was withdrawn, and the origin of the left common carotid artery was injected. The catheter was placed into the left common carotid artery. Angiogram was performed. The left external carotid artery was then selected. Formal angiogram was performed. Catheter was then withdrawn into the aortic arch and double flush performed. Left subclavian artery was then selected. Catheter tip was placed into the origin of the left  vertebral artery. Formal angiogram was performed. Catheter was then removed. Angiogram of the right common femoral artery access site was performed. Exoseal was deployed for hemostasis. Patient tolerated the procedure well and remained hemodynamically stable throughout. No complications were encountered, and no significant blood loss. 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 Mid left ICA injury, with intimal irregularity and less than 25% narrowing in the mid third, approximately 5 cm length - BCVI Grade I. Proximal left vertebral artery injury, with mild intimal irregularity and less than 25% narrowing just beyond the origin - BCVI Grade I. PLAN: Anti-platelet therapy elected for treatment. Electronically Signed   By: Corrie Mckusick D.O.   On: 09/06/2017 10:19    Review of Systems  Unable to perform ROS: Intubated   Blood pressure 117/82, pulse 63, temperature 99 F (37.2 C), resp. rate 20, height '5\' 10"'  (1.778 m), weight 65.1 kg (143 lb 8.3 oz), SpO2 100 %. Physical Exam  Constitutional: He appears well-developed and well-nourished. No distress.  HENT:  Head: Normocephalic.  Cardiovascular: Normal rate and regular rhythm.   Respiratory: Effort normal. No respiratory distress.  Musculoskeletal:  RLE Splint in place, anterior compartments soft  Sens DPN, SPN, TN unable to assess  Motor EHL, ext, flex, evers unable to assess  DP 1+, cap refill <2s   Skin: Skin is warm and dry. He is not diaphoretic.    Assessment/Plan: PHBC Right tib/fib fx -- No signs of evolving compartment syndrome. CT to be done later today but will probably need IMN. Dr. Griffin Basil to assess later today and provide definitive plan.  Updated family at bedside. C-spine/carotid injuries    Lisette Abu, PA-C Orthopedic Surgery [phone] 09/06/2017, 11:10 AM
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
## 2017-09-06 NOTE — Progress Notes (Signed)
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 of any known family members. Maximal Sterile Barrier Technique was utilized including caps, mask, sterile gowns, sterile gloves, sterile drape, hand hygiene and skin antiseptic. A timeout  was performed prior to the initiation of the procedure. FINDINGS: Right common carotid artery:  Normal course caliber and contour. Right external carotid artery: Patent with antegrade flow. Standing waves of the proximal internal maxillary artery. Mild luminal irregularity involving the occipital artery at the skullbase, potentially vasospasm or standing waist. Right internal carotid artery: Tapered luminal narrowing of the proximal right cervical ICA extending from just beyond the origin to the distal third of the cervical segment. Irregular filling defect at the anterior aspect of the proximal ICA, compatible with origin of dissection flap. Cervical ICA remains patent with stenosis at the distal aspect of the luminal filling defect. The affected segment measures approximately 6.5 cm. The distal cervical segment, vertical petrous ICA and horizontal petrous ICA with normal caliber and no intimal irregularity. Antegrade flow maintained, with no retrograde washout. No central filling defect. Unremarkable  course caliber and contour of the horizontal petrous segment, lacerum segment, cavernous segment, ophthalmic segment and terminal segment. Right MCA: There is some un-opacified slip-streaming artifact from patent posterior communicating artery into the patent MCA. Patent M1 and M2 branches. Maintained flow within frontal and parietal MCA branches. No delayed angiographic phase/cortical face. Unremarkable venous washout phase. Right ACA: A 1 segment patent, with right A2 perfusing the right territory. Right vertebral artery: Unremarkable course caliber and contour of the cervical vertebral artery. Patent posterior inferior cerebellar artery and anterior inferior cerebellar artery. Basilar artery patent with antegrade flow and significant on opacified inflow from the contralateral vertebral artery. Patent posterior cerebral artery with flow towards the anterior circulation Unremarkable venous phase. Left common carotid artery:  Normal course caliber and contour. Left external carotid artery: Patent with antegrade flow. Selective injection of the proximal external carotid artery demonstrates no pseudoaneurysm, extravasation, contrast pooling. Left internal carotid artery: Intimal irregularity of the cervical segment of the left internal cerebral artery originating in the mid third, measuring approximately 5 cm in length. Less than 25% narrowing, with no evidence of pseudoaneurysm. The irregular intimal contour extends into the vertical petrous segment. The horizontal petrous segment, lacerum segment, cavernous segment, supraclinoid and terminus within normal limits with no filling defect or significant luminal irregularity. No significant cross fill to the right hemisphere, although there is clearly patent anterior communicating artery, with some opacification of the right A2 and ACA territory. Left MCA: M1 segment patent. Insular and opercular segments patent. Unremarkable caliber and course of the cortical segments.  Typical arterial, capillary/ parenchymal, and venous phase. Left ACA:  A 1 segment patent.  Patent anterior communicating artery Left vertebral artery: Minimal irregularity of the proximal left vertebral artery with no significant stenosis. No luminal filling defect. The left vertebral artery is the dominant artery with significant filling of the basilar artery and retrograde opacification of the right vertebral artery at the vertebrobasilar junction. Bilateral posterior cerebral arteries patent. Left superior cerebellar artery patent. Posterior inferior cerebellar artery and anterior inferior cerebellar artery patent. Unremarkable appearance of the venous washout. PROCEDURE: Patient is brought to the IR suite and placed on the table in supine position. Timeout was performed to confirm the correct patient, procedure, and the existence of emergency consent. The patient was then prepped and draped in the usual sterile fashion. 1% lidocaine was used for local anesthesia. Ultrasound survey of the right inguinal region was performed with images stored and sent to PACs. A micropuncture needle was used access the right common femoral artery under ultrasound. With excellent arterial blood flow returned, and an .018 micro wire was passed through the  needle, observed enter the abdominal aorta under fluoroscopy. The needle was removed, and a micropuncture sheath was placed over the wire. The inner dilator and wire were removed, and an 035 J wire was advanced under fluoroscopy into the abdominal aorta. The sheath was removed and a standard 5 [place] vascular sheath was placed. The dilator was removed and the sheath was flushed. J wire and the JB catheter were advanced to the proximal descending thoracic aorta. Wire was removed. Double flush was performed. Innominate artery was then selected, with the catheter positioned into the right common carotid artery. Angiogram was performed. Catheter was then withdrawn to the innominate  artery, and double flush was performed. Using 035 guidewire, the origin of the vertebral artery was selected. Formal angiogram was performed. Catheter was withdrawn into the descending aorta. Double flush was performed. Innominate artery was then selected, catheter was withdrawn, and the origin of the left common carotid artery was injected. The catheter was placed into the left common carotid artery. Angiogram was performed. The left external carotid artery was then selected. Formal angiogram was performed. Catheter was then withdrawn into the aortic arch and double flush performed. Left subclavian artery was then selected. Catheter tip was placed into the origin of the left vertebral artery. Formal angiogram was performed. Catheter was then removed. Angiogram of the right common femoral artery access site was performed. Exoseal was deployed for hemostasis. Patient tolerated the procedure well and remained hemodynamically stable throughout. No complications were encountered, and no significant blood loss. 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 Mid left ICA injury, with intimal irregularity and less than 25% narrowing in the mid third, approximately 5 cm length - BCVI Grade I. Proximal left vertebral artery injury, with mild intimal irregularity and less than 25% narrowing just beyond the origin - BCVI Grade I. PLAN: Anti-platelet therapy elected for treatment. Electronically Signed   By: Gilmer Mor D.O.   On: 09/06/2017 10:19   Ir Angio Vertebral Sel Subclavian Innominate Bilat Mod Sed  Result Date: 09/06/2017 INDICATION: 36 year old male with blunt trauma. History of cervical spine injury and presenting with symptoms  of neurogenic shock. CT imaging suggestive of cervical vasculature trauma, with right and left carotid dissection and question of left vertebral artery injury. He presents for angiogram and possible intervention. EXAM: ULTRASOUND-GUIDED RIGHT COMMON FEMORAL ARTERY ACCESS. CERVICAL AND CEREBRAL ANGIOGRAM DEPLOYMENT OF EXOSEAL FOR HEMOSTASIS COMPARISON:  CT imaging of the same day MEDICATIONS: 650 mg aspirin via orogastric tube. The anti-platelet was administered within 1 hour of the procedure Intra procedural IV Versed 1 mg ANESTHESIA/SEDATION: Previous intubation with propofol sedation The patient was continuously monitored during the procedure by the interventional radiology nurse under my direct supervision. CONTRAST:  110 cc Isovue-300 FLUOROSCOPY TIME:  Fluoroscopy Time: 7 minutes 24 seconds (2029 mGy). COMPLICATIONS: None TECHNIQUE: Emergent consent was presumed given the emergent nature of the the acute neurologic condition, and the absence of any known family members. Maximal Sterile Barrier Technique was utilized including caps, mask, sterile gowns, sterile gloves, sterile drape, hand hygiene and skin antiseptic. A timeout was performed prior to the initiation of the procedure. FINDINGS: Right common carotid artery:  Normal course caliber and contour. Right external carotid artery: Patent with antegrade flow. Standing waves of the  proximal internal maxillary artery. Mild luminal irregularity involving the occipital artery at the skullbase, potentially vasospasm or standing waist. Right internal carotid artery: Tapered luminal narrowing of the proximal right cervical ICA extending from just beyond the origin to the distal third of the cervical segment. Irregular filling defect at the anterior aspect of the proximal ICA, compatible with origin of dissection flap. Cervical ICA remains patent with stenosis at the distal aspect of the luminal filling defect. The affected segment measures approximately 6.5 cm. The  distal cervical segment, vertical petrous ICA and horizontal petrous ICA with normal caliber and no intimal irregularity. Antegrade flow maintained, with no retrograde washout. No central filling defect. Unremarkable course caliber and contour of the horizontal petrous segment, lacerum segment, cavernous segment, ophthalmic segment and terminal segment. Right MCA: There is some un-opacified slip-streaming artifact from patent posterior communicating artery into the patent MCA. Patent M1 and M2 branches. Maintained flow within frontal and parietal MCA branches. No delayed angiographic phase/cortical face. Unremarkable venous washout phase. Right ACA: A 1 segment patent, with right A2 perfusing the right territory. Right vertebral artery: Unremarkable course caliber and contour of the cervical vertebral artery. Patent posterior inferior cerebellar artery and anterior inferior cerebellar artery. Basilar artery patent with antegrade flow and significant on opacified inflow from the contralateral vertebral artery. Patent posterior cerebral artery with flow towards the anterior circulation Unremarkable venous phase. Left common carotid artery:  Normal course caliber and contour. Left external carotid artery: Patent with antegrade flow. Selective injection of the proximal external carotid artery demonstrates no pseudoaneurysm, extravasation, contrast pooling. Left internal carotid artery: Intimal irregularity of the cervical segment of the left internal cerebral artery originating in the mid third, measuring approximately 5 cm in length. Less than 25% narrowing, with no evidence of pseudoaneurysm. The irregular intimal contour extends into the vertical petrous segment. The horizontal petrous segment, lacerum segment, cavernous segment, supraclinoid and terminus within normal limits with no filling defect or significant luminal irregularity. No significant cross fill to the right hemisphere, although there is clearly patent  anterior communicating artery, with some opacification of the right A2 and ACA territory. Left MCA: M1 segment patent. Insular and opercular segments patent. Unremarkable caliber and course of the cortical segments. Typical arterial, capillary/ parenchymal, and venous phase. Left ACA:  A 1 segment patent.  Patent anterior communicating artery Left vertebral artery: Minimal irregularity of the proximal left vertebral artery with no significant stenosis. No luminal filling defect. The left vertebral artery is the dominant artery with significant filling of the basilar artery and retrograde opacification of the right vertebral artery at the vertebrobasilar junction. Bilateral posterior cerebral arteries patent. Left superior cerebellar artery patent. Posterior inferior cerebellar artery and anterior inferior cerebellar artery patent. Unremarkable appearance of the venous washout. PROCEDURE: Patient is brought to the IR suite and placed on the table in supine position. Timeout was performed to confirm the correct patient, procedure, and the existence of emergency consent. The patient was then prepped and draped in the usual sterile fashion. 1% lidocaine was used for local anesthesia. Ultrasound survey of the right inguinal region was performed with images stored and sent to PACs. A micropuncture needle was used access the right common femoral artery under ultrasound. With excellent arterial blood flow returned, and an .018 micro wire was passed through the needle, observed enter the abdominal aorta under fluoroscopy. The needle was removed, and a micropuncture sheath was placed over the wire. The inner dilator and wire were removed, and an 035 J  wire was advanced under fluoroscopy into the abdominal aorta. The sheath was removed and a standard 5 [place] vascular sheath was placed. The dilator was removed and the sheath was flushed. J wire and the JB catheter were advanced to the proximal descending thoracic aorta. Wire  was removed. Double flush was performed. Innominate artery was then selected, with the catheter positioned into the right common carotid artery. Angiogram was performed. Catheter was then withdrawn to the innominate artery, and double flush was performed. Using 035 guidewire, the origin of the vertebral artery was selected. Formal angiogram was performed. Catheter was withdrawn into the descending aorta. Double flush was performed. Innominate artery was then selected, catheter was withdrawn, and the origin of the left common carotid artery was injected. The catheter was placed into the left common carotid artery. Angiogram was performed. The left external carotid artery was then selected. Formal angiogram was performed. Catheter was then withdrawn into the aortic arch and double flush performed. Left subclavian artery was then selected. Catheter tip was placed into the origin of the left vertebral artery. Formal angiogram was performed. Catheter was then removed. Angiogram of the right common femoral artery access site was performed. Exoseal was deployed for hemostasis. Patient tolerated the procedure well and remained hemodynamically stable throughout. No complications were encountered, and no significant blood loss. 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 Mid left ICA injury, with intimal irregularity and less than 25% narrowing in the mid third, approximately 5 cm length - BCVI Grade I. Proximal left vertebral artery injury, with mild intimal irregularity and less than 25% narrowing just beyond the origin - BCVI Grade I. PLAN: Anti-platelet therapy elected for treatment. Electronically Signed   By: Gilmer Mor D.O.   On:  09/06/2017 10:19   Ir Angio External Carotid Sel Ext Carotid Uni L Mod Sed  Result Date: 09/06/2017 INDICATION: 36 year old male with blunt trauma. History of cervical spine injury and presenting with symptoms of neurogenic shock. CT imaging suggestive of cervical vasculature trauma, with right and left carotid dissection and question of left vertebral artery injury. He presents for angiogram and possible intervention. EXAM: ULTRASOUND-GUIDED RIGHT COMMON FEMORAL ARTERY ACCESS. CERVICAL AND CEREBRAL ANGIOGRAM DEPLOYMENT OF EXOSEAL FOR HEMOSTASIS COMPARISON:  CT imaging of the same day MEDICATIONS: 650 mg aspirin via orogastric tube. The anti-platelet was administered within 1 hour of the procedure Intra procedural IV Versed 1 mg ANESTHESIA/SEDATION: Previous intubation with propofol sedation The patient was continuously monitored during the procedure by the interventional radiology nurse under my direct supervision. CONTRAST:  110 cc Isovue-300 FLUOROSCOPY TIME:  Fluoroscopy Time: 7 minutes 24 seconds (2029 mGy). COMPLICATIONS: None TECHNIQUE: Emergent consent was presumed given the emergent nature of the the acute neurologic condition, and the absence of any known family members. Maximal Sterile Barrier Technique was utilized including caps, mask, sterile gowns, sterile gloves, sterile drape, hand hygiene and skin antiseptic. A timeout was performed prior to the initiation of the procedure. FINDINGS: Right common carotid artery:  Normal course caliber and contour. Right external carotid artery: Patent with antegrade flow. Standing waves of the proximal internal maxillary artery. Mild luminal irregularity involving the occipital artery at the skullbase, potentially vasospasm or standing waist. Right internal carotid artery: Tapered luminal narrowing of the proximal right  cervical ICA extending from just beyond the origin to the distal third of the cervical segment. Irregular filling defect at the anterior aspect  of the proximal ICA, compatible with origin of dissection flap. Cervical ICA remains patent with stenosis at the distal aspect of the luminal filling defect. The affected segment measures approximately 6.5 cm. The distal cervical segment, vertical petrous ICA and horizontal petrous ICA with normal caliber and no intimal irregularity. Antegrade flow maintained, with no retrograde washout. No central filling defect. Unremarkable course caliber and contour of the horizontal petrous segment, lacerum segment, cavernous segment, ophthalmic segment and terminal segment. Right MCA: There is some un-opacified slip-streaming artifact from patent posterior communicating artery into the patent MCA. Patent M1 and M2 branches. Maintained flow within frontal and parietal MCA branches. No delayed angiographic phase/cortical face. Unremarkable venous washout phase. Right ACA: A 1 segment patent, with right A2 perfusing the right territory. Right vertebral artery: Unremarkable course caliber and contour of the cervical vertebral artery. Patent posterior inferior cerebellar artery and anterior inferior cerebellar artery. Basilar artery patent with antegrade flow and significant on opacified inflow from the contralateral vertebral artery. Patent posterior cerebral artery with flow towards the anterior circulation Unremarkable venous phase. Left common carotid artery:  Normal course caliber and contour. Left external carotid artery: Patent with antegrade flow. Selective injection of the proximal external carotid artery demonstrates no pseudoaneurysm, extravasation, contrast pooling. Left internal carotid artery: Intimal irregularity of the cervical segment of the left internal cerebral artery originating in the mid third, measuring approximately 5 cm in length. Less than 25% narrowing, with no evidence of pseudoaneurysm. The irregular intimal contour extends into the vertical petrous segment. The horizontal petrous segment, lacerum  segment, cavernous segment, supraclinoid and terminus within normal limits with no filling defect or significant luminal irregularity. No significant cross fill to the right hemisphere, although there is clearly patent anterior communicating artery, with some opacification of the right A2 and ACA territory. Left MCA: M1 segment patent. Insular and opercular segments patent. Unremarkable caliber and course of the cortical segments. Typical arterial, capillary/ parenchymal, and venous phase. Left ACA:  A 1 segment patent.  Patent anterior communicating artery Left vertebral artery: Minimal irregularity of the proximal left vertebral artery with no significant stenosis. No luminal filling defect. The left vertebral artery is the dominant artery with significant filling of the basilar artery and retrograde opacification of the right vertebral artery at the vertebrobasilar junction. Bilateral posterior cerebral arteries patent. Left superior cerebellar artery patent. Posterior inferior cerebellar artery and anterior inferior cerebellar artery patent. Unremarkable appearance of the venous washout. PROCEDURE: Patient is brought to the IR suite and placed on the table in supine position. Timeout was performed to confirm the correct patient, procedure, and the existence of emergency consent. The patient was then prepped and draped in the usual sterile fashion. 1% lidocaine was used for local anesthesia. Ultrasound survey of the right inguinal region was performed with images stored and sent to PACs. A micropuncture needle was used access the right common femoral artery under ultrasound. With excellent arterial blood flow returned, and an .018 micro wire was passed through the needle, observed enter the abdominal aorta under fluoroscopy. The needle was removed, and a micropuncture sheath was placed over the wire. The inner dilator and wire were removed, and an 035 J wire was advanced under fluoroscopy into the abdominal  aorta. The sheath was removed and a standard 5 [place] vascular sheath was placed. The dilator was removed and the sheath  was flushed. J wire and the JB catheter were advanced to the proximal descending thoracic aorta. Wire was removed. Double flush was performed. Innominate artery was then selected, with the catheter positioned into the right common carotid artery. Angiogram was performed. Catheter was then withdrawn to the innominate artery, and double flush was performed. Using 035 guidewire, the origin of the vertebral artery was selected. Formal angiogram was performed. Catheter was withdrawn into the descending aorta. Double flush was performed. Innominate artery was then selected, catheter was withdrawn, and the origin of the left common carotid artery was injected. The catheter was placed into the left common carotid artery. Angiogram was performed. The left external carotid artery was then selected. Formal angiogram was performed. Catheter was then withdrawn into the aortic arch and double flush performed. Left subclavian artery was then selected. Catheter tip was placed into the origin of the left vertebral artery. Formal angiogram was performed. Catheter was then removed. Angiogram of the right common femoral artery access site was performed. Exoseal was deployed for hemostasis. Patient tolerated the procedure well and remained hemodynamically stable throughout. No complications were encountered, and no significant blood loss. 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 Mid left ICA injury, with intimal irregularity and less than 25% narrowing in the mid third, approximately 5 cm length - BCVI Grade I. Proximal left  vertebral artery injury, with mild intimal irregularity and less than 25% narrowing just beyond the origin - BCVI Grade I. PLAN: Anti-platelet therapy elected for treatment. Electronically Signed   By: Gilmer Mor D.O.   On: 09/06/2017 10:19    Labs:  CBC:  Recent Labs  09/06/17 0204 09/06/17 0210  WBC 18.0*  --   HGB 13.6 14.6  HCT 41.7 43.0  PLT 189  --     COAGS: No results for input(s): INR, APTT in the last 8760 hours.  BMP:  Recent Labs  09/06/17 0204 09/06/17 0210  NA 139 143  K 3.4* 3.3*  CL 109 109  CO2 21*  --   GLUCOSE 129* 125*  BUN 13 18  CALCIUM 8.4*  --   CREATININE 1.54* 1.40*  GFRNONAA 57*  --   GFRAA >60  --     LIVER FUNCTION TESTS: No results for input(s): BILITOT, AST, ALT, ALKPHOS, PROT, ALBUMIN in the last 8760 hours.  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  No intervention, stent placement, was warranted.  He will be treated with 325mg  ASA.  He will not require dual anti-platelet therapy.    His L ICA and bilateral VAs were also assessed.  All of these also show good perfusion and flow to the brain.  No intervention was needed for these areas either.  There was no evidence of pseudoaneurysm seen on angiogram.  Electronically Signed: Kieren Ricci E 09/06/2017, 11:51 AM   I spent a total of 15 Minutes at the the patient's bedside AND on the patient's hospital floor or unit, greater than 50% of which was counseling/coordinating care for blunt force cerebrovascular injury

## 2017-09-07 ENCOUNTER — Inpatient Hospital Stay (HOSPITAL_COMMUNITY): Payer: Medicaid Other

## 2017-09-07 ENCOUNTER — Encounter (HOSPITAL_COMMUNITY): Payer: Self-pay | Admitting: Certified Registered Nurse Anesthetist

## 2017-09-07 LAB — PREPARE FRESH FROZEN PLASMA
Unit division: 0
Unit division: 0

## 2017-09-07 LAB — BASIC METABOLIC PANEL
ANION GAP: 6 (ref 5–15)
BUN: 11 mg/dL (ref 6–20)
CALCIUM: 7.9 mg/dL — AB (ref 8.9–10.3)
CHLORIDE: 120 mmol/L — AB (ref 101–111)
CO2: 17 mmol/L — AB (ref 22–32)
Creatinine, Ser: 1.14 mg/dL (ref 0.61–1.24)
GFR calc Af Amer: >60 mL/min (ref >60)
GFR calc non Af Amer: >60 mL/min (ref >60)
Glucose, Bld: 78 mg/dL (ref 65–99)
Potassium: 3.6 mmol/L (ref 3.5–5.1)
SODIUM: 143 mmol/L (ref 135–145)

## 2017-09-07 LAB — GLUCOSE, CAPILLARY
GLUCOSE-CAPILLARY: 69 mg/dL (ref 65–99)
Glucose-Capillary: 117 mg/dL — ABNORMAL HIGH (ref 65–99)
Glucose-Capillary: 75 mg/dL (ref 65–99)
Glucose-Capillary: 86 mg/dL (ref 65–99)
Glucose-Capillary: 71 mg/dL (ref 65–99)
Glucose-Capillary: 76 mg/dL (ref 65–99)

## 2017-09-07 LAB — CBC
HEMATOCRIT: 32.7 % — AB (ref 39.0–52.0)
Hemoglobin: 11.2 g/dL — ABNORMAL LOW (ref 13.0–17.0)
MCH: 30.9 pg (ref 26.0–34.0)
MCHC: 34.3 g/dL (ref 30.0–36.0)
MCV: 90.1 fL (ref 78.0–100.0)
PLATELETS: 122 10*3/uL — AB (ref 150–400)
RBC: 3.63 MIL/uL — ABNORMAL LOW (ref 4.22–5.81)
RDW: 15.5 % (ref 11.5–15.5)
WBC: 12.3 10*3/uL — AB (ref 4.0–10.5)

## 2017-09-07 LAB — BPAM FFP
BLOOD PRODUCT EXPIRATION DATE: 201809162359
Blood Product Expiration Date: 201809192359
ISSUE DATE / TIME: 201809140141
ISSUE DATE / TIME: 201809140141
UNIT TYPE AND RH: 6200
Unit Type and Rh: 6200

## 2017-09-07 LAB — HIV ANTIBODY (ROUTINE TESTING W REFLEX): HIV SCREEN 4TH GENERATION: NONREACTIVE

## 2017-09-07 MED ORDER — DEXTROSE 50 % IV SOLN: 25.0000 mL | Freq: Once | INTRAVENOUS | Status: AC

## 2017-09-07 MED ORDER — PROPOFOL 1000 MG/100ML IV EMUL
0.0000 ug/kg/min | INTRAVENOUS | Status: DC
  Administered 2017-09-07 – 2017-09-08 (×2): 10 ug/kg/min via INTRAVENOUS
  Filled 2017-09-07 (×2): qty 100

## 2017-09-07 MED ORDER — DEXTROSE 50 % IV SOLN
INTRAVENOUS | Status: AC
  Administered 2017-09-07: 25 mL
  Filled 2017-09-07: qty 50

## 2017-09-07 MED ORDER — ACETAMINOPHEN 160 MG/5ML PO SOLN
650.0000 mg | ORAL | Status: DC | PRN
  Administered 2017-09-07 – 2017-09-09 (×6): 650 mg
  Filled 2017-09-07 (×6): qty 20.3

## 2017-09-07 NOTE — Progress Notes (Signed)
Follow up - Trauma and Critical Care  Patient Details:    Dustin Marquez is an 36 y.o. male.  Lines/tubes : Airway 7.5 mm (Active)  Secured at (cm) 26 cm 09/07/2017  3:30 PM  Measured From Lips 09/07/2017  3:30 PM  Secured Location Right 09/07/2017  3:30 PM  Secured By [employer] 09/07/2017  3:30 PM  Tube Holder Repositioned Yes 09/07/2017  3:30 PM  Site Condition Dry 09/07/2017  3:30 PM     NG/OG Tube Orogastric 16 Fr. Center mouth Xray (Active)  Site Assessment Clean;Dry;Intact 09/07/2017 10:00 AM  Ongoing Placement Verification No change in respiratory status;No acute changes, not attributed to clinical condition;Xray;Auscultation 09/07/2017 10:00 AM  Status Suction-low intermittent 09/07/2017 10:00 AM  Amount of suction 60 mmHg 09/07/2017  8:00 AM  Drainage Appearance Bile 09/07/2017  8:00 AM  Intake (mL) 30 mL 09/07/2017  8:00 AM  Output (mL) 100 mL 09/07/2017  4:00 PM     Urethral Catheter [name], RN Latex;Straight-tip;Temperature probe 16 Fr. (Active)  Indication for Insertion or Continuance of Catheter Unstable critical patients (first 24-48 hours) 09/07/2017 10:00 AM  Site Assessment Clean;Dry;Intact 09/07/2017 10:00 AM  Catheter Maintenance Bag below level of bladder;Catheter secured;Drainage bag/tubing not touching floor;Insertion date on drainage bag;No dependent loops;Seal intact;Bag emptied prior to transport 09/07/2017 10:00 AM  Collection Container Standard drainage bag 09/07/2017 10:00 AM  Securement Method Securing device (Describe) 09/07/2017 10:00 AM  Urinary Catheter Interventions Unclamped 09/07/2017 10:00 AM  Input (mL) 0 mL 09/07/2017  8:00 AM  Output (mL) 125 mL 09/07/2017  4:00 PM    Microbiology/Sepsis markers: Results for orders placed or performed during the hospital encounter of 09/06/17  MRSA PCR Screening     Status: None   Collection Time: 09/06/17  7:25 AM  Result Value Ref Range Status   MRSA by PCR NEGATIVE NEGATIVE Final    Comment:         The GeneXpert MRSA Assay (FDA approved for NASAL specimens only), is one component of a comprehensive MRSA colonization surveillance program. It is not intended to diagnose MRSA infection nor to guide or monitor treatment for MRSA infections.     Anti-infectives:  Anti-infectives    None     Consults: Treatment Team:  Tia Alert, MD Bjorn Pippin, MD    Events:  Chief Complaint/Subjective:    Overnight Issues: None  Objective:  Vital signs for last 24 hours: Temp:  [98.2 F (36.8 C)-100.4 F (38 C)] 100.4 F (38 C) (09/15 1600) Pulse Rate:  [57-69] 62 (09/15 1730) Resp:  [14-21] 16 (09/15 1730) BP: (95-132)/(56-82) 115/67 (09/15 1730) SpO2:  [99 %-100 %] 100 % (09/15 1730) FiO2 (%):  [40 %] 40 % (09/15 1530) Weight:  [73.9 kg (162 lb 14.7 oz)] 73.9 kg (162 lb 14.7 oz) (09/15 0230)  Hemodynamic parameters for last 24 hours:    Intake/Output from previous day: 09/14 0701 - 09/15 0700 In: 2355.6 [I.V.:2295.6; NG/GT:60] Out: 2410 [Urine:2310; Emesis/NG output:100]  Intake/Output this shift: Total I/O In: 1152.8 [I.V.:1122.8; NG/GT:30] Out: 925 [Urine:725; Emesis/NG output:200]  Vent settings for last 24 hours: Vent Mode: PRVC FiO2 (%):  [40 %] 40 % Set Rate:  [16 bmp] 16 bmp Vt Set:  [161 mL] 620 mL PEEP:  [5 cmH20] 5 cmH20 Plateau Pressure:  [16 cmH20-19 cmH20] 17 cmH20  Physical Exam:  Gen: NAD HEENT: Normocephalic Resp: Intubated Cardiovascular: RRR Abdomen: soft, nondistended Ext: RLE splint in place; compartments soft Neuro: moving both lower  extremities when asked;  Results for orders placed or performed during the hospital encounter of 09/06/17 (from the past 24 hour(s))  Glucose, capillary     Status: None   Collection Time: 09/06/17 11:35 PM  Result Value Ref Range   Glucose-Capillary 82 65 - 99 mg/dL   Comment 1 Capillary Specimen    Comment 2 Notify RN   Glucose, capillary     Status: None   Collection Time: 09/07/17  3:26  AM  Result Value Ref Range   Glucose-Capillary 69 65 - 99 mg/dL   Comment 1 Capillary Specimen    Comment 2 Notify RN   Glucose, capillary     Status: Abnormal   Collection Time: 09/07/17  4:10 AM  Result Value Ref Range   Glucose-Capillary 117 (H) 65 - 99 mg/dL  Glucose, capillary     Status: None   Collection Time: 09/07/17  8:38 AM  Result Value Ref Range   Glucose-Capillary 75 65 - 99 mg/dL   Comment 1 Capillary Specimen    Comment 2 Notify RN   Basic metabolic panel     Status: Abnormal   Collection Time: 09/07/17 11:14 AM  Result Value Ref Range   Sodium 143 135 - 145 mmol/L   Potassium 3.6 3.5 - 5.1 mmol/L   Chloride 120 (H) 101 - 111 mmol/L   CO2 17 (L) 22 - 32 mmol/L   Glucose, Bld 78 65 - 99 mg/dL   BUN 11 6 - 20 mg/dL   Creatinine, Ser 1.61 0.61 - 1.24 mg/dL   Calcium 7.9 (L) 8.9 - 10.3 mg/dL   GFR calc non Af Amer >60 >60 mL/min   GFR calc Af Amer >60 >60 mL/min   Anion gap 6 5 - 15  CBC     Status: Abnormal   Collection Time: 09/07/17 11:14 AM  Result Value Ref Range   WBC 12.3 (H) 4.0 - 10.5 K/uL   RBC 3.63 (L) 4.22 - 5.81 MIL/uL   Hemoglobin 11.2 (L) 13.0 - 17.0 g/dL   HCT 09.6 (L) 04.5 - 40.9 %   MCV 90.1 78.0 - 100.0 fL   MCH 30.9 26.0 - 34.0 pg   MCHC 34.3 30.0 - 36.0 g/dL   RDW 81.1 91.4 - 78.2 %   Platelets 122 (L) 150 - 400 K/uL  Glucose, capillary     Status: None   Collection Time: 09/07/17 11:39 AM  Result Value Ref Range   Glucose-Capillary 86 65 - 99 mg/dL  Glucose, capillary     Status: None   Collection Time: 09/07/17  4:13 PM  Result Value Ref Range   Glucose-Capillary 71 65 - 99 mg/dL     Assessment/Plan:   NEURO  -Concussion -C6 FX with cord injury and epidural hematoma -Blunt cerebrovascular injury including grade 4 right internal carotid dissection, grade 3 left internal carotid injury with 3 mm pseudoaneurysm and grade 1 left vertebral artery injury at the level of the fracture   Plan: Continue c-collar per neurosurgery;  possible MRI in future  PULM  Intubated; L 1st rib fx with tiny occult ptx; CXR ordered for AM   Plan: wean as tolerated; hopefully extubate soon  CARDIO  No issues   Plan: continue plan of care  RENAL  No issues   Plan: Continue plan of care  GI  No issues   Plan: NPO; TF if remains intubated  ID  AF, no abx   Plan: Continue to monitor  HEME  Hgb stable  Plan: Monitor  ENDO No issues   Plan: Watch CBGs  Global Issues  MSK - R tib/fib fxs - closed. Pending additional recs from ortho - pt has been off the floor on attempts at seeing him    LOS: 1 day   Additional comments:I reviewed the patient's new clinical lab test results. As noted above  Critical Care Total Time*: 30 Minutes  Andria Meuse 09/07/2017  *Care during the described time interval was provided by me and/or other providers on the critical care team.  I have reviewed this patient's available data, including medical history, events of note, physical examination and test results as part of my evaluation.

## 2017-09-07 NOTE — Progress Notes (Signed)
Patient more he mechanically stable. He remains on ventilator. He is awake and appears Aware. Patient will follow commands of both distal upper extremities. Moving both lower extremities to command. Difficult to get him to flex his right elbow secondary to presumed pain. He appears to have normal sensation throughout.  Patient with significant cervical spine posterior element fracture. Continue cervical collar and observation. Hopefully as patient's mental status improves and he is able to be extubated we can get a better assessment of his full neurologic function. With that in mind eventual MRI scanning may be necessary.

## 2017-09-08 ENCOUNTER — Inpatient Hospital Stay (HOSPITAL_COMMUNITY): Payer: Medicaid Other

## 2017-09-08 LAB — GLUCOSE, CAPILLARY
GLUCOSE-CAPILLARY: 100 mg/dL — AB (ref 65–99)
GLUCOSE-CAPILLARY: 103 mg/dL — AB (ref 65–99)
GLUCOSE-CAPILLARY: 109 mg/dL — AB (ref 65–99)
GLUCOSE-CAPILLARY: 94 mg/dL (ref 65–99)
GLUCOSE-CAPILLARY: 97 mg/dL (ref 65–99)
Glucose-Capillary: 96 mg/dL (ref 65–99)
Glucose-Capillary: 95 mg/dL (ref 65–99)

## 2017-09-08 NOTE — Progress Notes (Signed)
Overall stable. No new issues or problems. Patient remained sedated on ventilator. He will awaken. He will follow commands bilaterally. He has good strength in both hands although he has difficulty with proximal muscle movement secondary to pain best I can tell.  Continue cervical collar. Patient will at least require upright x-rays when he is able to be mobilized from a medical/orthopedic standpoint. May require cervical fixation and fusion at a later date.

## 2017-09-08 NOTE — Progress Notes (Signed)
Patient seen in ICU again today in regards to his RLE injuries.  Patient continues to be intubated and sedated so no history obtained.  PE: RLE in long leg splint.  Foot and ankle swollen but skin intact.  WWP foot, no motor or sensory due to intubation  AP: -Multiple small non-displaced foot fractures/avulsions, segmental fibula fracture with proximal component with some concern of PLC avulsion and distal tibial fracture extending into the joint  - Patient requires operative intervention for these injuries.  Discussed with Trauma PA team plan for OR tomorrow if that is medically appropriate. -Plan will be for ORIF distal tibia and splinting vs Ex fix pending swelling.  We additionally will perform an EUA of the knee and if the lateral ligament complex is damaged at the knee may intervene on this at the time of surgery as well. 

## 2017-09-08 NOTE — Progress Notes (Signed)
Patient ID: Dustin Marquez, male   DOB: September 26, 1981, 36 y.o.   MRN: [id number] Follow up - Trauma Critical Care  Patient Details:    Dustin Marquez is an 36 y.o. male.  Lines/tubes : Airway 7.5 mm (Active)  Secured at (cm) 26 cm 09/08/2017 11:30 AM  Measured From Lips 09/08/2017 11:30 AM  Secured Location Left 09/08/2017 11:30 AM  Secured By [employer] 09/08/2017 11:30 AM  Tube Holder Repositioned Yes 09/08/2017 11:30 AM  Cuff Pressure (cm H2O) 24 cm H2O 09/08/2017  7:40 AM  Site Condition Dry 09/08/2017 11:30 AM     NG/OG Tube Orogastric 16 Fr. Center mouth Xray (Active)  External Length of Tube (cm) - (if applicable) 42 cm 09/08/2017  8:00 AM  Site Assessment Clean;Dry;Intact 09/08/2017  8:00 AM  Ongoing Placement Verification No change in respiratory status;No acute changes, not attributed to clinical condition;Xray 09/08/2017  8:00 AM  Status Suction-low intermittent 09/08/2017  8:00 AM  Amount of suction 60 mmHg 09/07/2017  8:00 PM  Drainage Appearance Bile 09/07/2017  8:00 PM  Intake (mL) 30 mL 09/07/2017  8:00 AM  Output (mL) 0 mL 09/08/2017 10:00 AM     Urethral Catheter [name], RN Latex;Straight-tip;Temperature probe 16 Fr. (Active)  Indication for Insertion or Continuance of Catheter Unstable critical patients (first 24-48 hours) 09/08/2017  8:00 AM  Site Assessment Clean;Dry;Intact 09/08/2017  8:00 AM  Catheter Maintenance Bag below level of bladder;Insertion date on drainage bag;Catheter secured;No dependent loops;Drainage bag/tubing not touching floor;Seal intact;Bag emptied prior to transport 09/08/2017  8:00 AM  Collection Container Standard drainage bag 09/08/2017  8:00 AM  Securement Method Securing device (Describe) 09/08/2017  8:00 AM  Urinary Catheter Interventions Unclamped 09/08/2017  8:00 AM  Input (mL) 0 mL 09/07/2017  8:00 AM  Output (mL) 75 mL 09/08/2017 10:00 AM    Microbiology/Sepsis markers: Results for orders placed or performed during the  hospital encounter of 09/06/17  MRSA PCR Screening     Status: None   Collection Time: 09/06/17  7:25 AM  Result Value Ref Range Status   MRSA by PCR NEGATIVE NEGATIVE Final    Comment:        The GeneXpert MRSA Assay (FDA approved for NASAL specimens only), is one component of a comprehensive MRSA colonization surveillance program. It is not intended to diagnose MRSA infection nor to guide or monitor treatment for MRSA infections.     Anti-infectives:  Anti-infectives    None      Best Practice/Protocols:  VTE Prophylaxis: Mechanical GI Prophylaxis: Proton Pump Inhibitor Continous Sedation  Consults: Treatment Team:  Tia Alert, MD Bjorn Pippin, MD    Studies:    Events:  Subjective:    Overnight Issues:  None.  Objective:  Vital signs for last 24 hours: Temp:  [100.4 F (38 C)-101.7 F (38.7 C)] 101.3 F (38.5 C) (09/16 0800) Pulse Rate:  [58-86] 69 (09/16 1100) Resp:  [14-18] 18 (09/16 1100) BP: (104-134)/(57-79) 116/62 (09/16 1100) SpO2:  [100 %] 100 % (09/16 1130) FiO2 (%):  [40 %] 40 % (09/16 1130)  Hemodynamic parameters for last 24 hours:    Intake/Output from previous day: 09/15 0701 - 09/16 0700 In: 2722.8 [I.V.:2692.8; NG/GT:30] Out: 2125 [Urine:1525; Emesis/NG output:600]  Intake/Output this shift: Total I/O In: 433.3 [I.V.:433.3] Out: 325 [Urine:275; Emesis/NG output:50]  Vent settings for last 24 hours: Vent Mode: PSV;CPAP FiO2 (%):  [40 %] 40 % Set Rate:  [16 bmp] 16 bmp Vt Set:  [  620 mL] 620 mL PEEP:  [5 cmH20] 5 cmH20 Pressure Support:  [10 cmH20] 10 cmH20 Plateau Pressure:  [16 cmH20-19 cmH20] 19 cmH20  Physical Exam:  Intubated; follows some commands (on fentanyl gtt, propofol off for PS trial) cta b/l Reg Soft, nd, no grimace Splint RLE - which has been opened; good peripheral pulses; generalized swelling R distal LE/foot PERRL  Results for orders placed or performed during the hospital encounter of  09/06/17 (from the past 24 hour(s))  Glucose, capillary     Status: None   Collection Time: 09/07/17  4:13 PM  Result Value Ref Range   Glucose-Capillary 71 65 - 99 mg/dL  Glucose, capillary     Status: None   Collection Time: 09/07/17  8:13 PM  Result Value Ref Range   Glucose-Capillary 76 65 - 99 mg/dL  Glucose, capillary     Status: None   Collection Time: 09/08/17 12:16 AM  Result Value Ref Range   Glucose-Capillary 97 65 - 99 mg/dL  Glucose, capillary     Status: Abnormal   Collection Time: 09/08/17  4:03 AM  Result Value Ref Range   Glucose-Capillary 103 (H) 65 - 99 mg/dL  Glucose, capillary     Status: None   Collection Time: 09/08/17  7:20 AM  Result Value Ref Range   Glucose-Capillary 96 65 - 99 mg/dL    Assessment & Plan: Present on Admission: . C6 cervical fracture (HCC) PHBC Concussion C6 FX with cord injury and epidural hematoma - continue collar, will at least require upright x-rays when he is able to be mobilized from a medical/orthopedic standpoint. May require cervical fixation and fusion at a later date. Blunt cerebrovascular injury including grade 4 right internal carotid dissection, grade 3 left internal carotid injury with 3 mm pseudoaneurysm and grade 1 left vertebral artery injury at the level of the fracture - Dr. Loreta Ave did cerebral angiogram- had good perfusion, needs 325 mg ASA only Neurogenic shock - improved after 2 units PRBC and 2 units of FFP in trauma bay; resolved L first rib FX with tiny occult PTX -  F/u cxr - no PTX Forehead and scalp lacs - closed in ED R elbow abrasions - x-ray negative R tib fib FX and foot fx - Dr. Everardo Pacific plans ORIF distal tibia vs ext fix, EUA knee on Monday Renal- uop ok, cr ok. Cont foley FEN- on maint fluid, will start TF via OG, NPO p MN, check lytes Monday ID - febrile, will monitor for now; repeat CBC in am ABL anemia- hgb stable as of yesterday VTE prophylaxis - on 325 mg ASA   LOS: 2 days   Additional  comments:I reviewed the patient's new clinical lab test results.  and I reviewed the patients new imaging test results.   Critical Care Total Time*: 30 Minutes  Dustin Marquez. Dustin Campanile, MD, FACS General, Bariatric, & Minimally Invasive Surgery [facility] Surgery, [place]   09/08/2017  *Care during the described time interval was provided by me. I have reviewed this patient's available data, including medical history, events of note, physical examination and test results as part of my evaluation.

## 2017-09-09 ENCOUNTER — Inpatient Hospital Stay (HOSPITAL_COMMUNITY): Payer: Medicaid Other | Admitting: Certified Registered"

## 2017-09-09 ENCOUNTER — Inpatient Hospital Stay (HOSPITAL_COMMUNITY): Payer: Medicaid Other

## 2017-09-09 ENCOUNTER — Encounter (HOSPITAL_COMMUNITY): Admission: EM | Disposition: A | Discharge status: Skilled Nursing Facility | Payer: Self-pay | Source: Home / Self Care

## 2017-09-09 DIAGNOSIS — S82871A Displaced pilon fracture of right tibia, initial encounter for closed fracture: Secondary | ICD-10-CM | POA: Diagnosis present

## 2017-09-09 HISTORY — PX: EXTERNAL FIXATION LEG: SHX1549

## 2017-09-09 LAB — COMPREHENSIVE METABOLIC PANEL
ALBUMIN: 2.7 g/dL — AB (ref 3.5–5.0)
ALT: 115 U/L — ABNORMAL HIGH (ref 17–63)
ANION GAP: 3 — AB (ref 5–15)
AST: 86 U/L — AB (ref 15–41)
Alkaline Phosphatase: 36 U/L — ABNORMAL LOW (ref 38–126)
BUN: 12 mg/dL (ref 6–20)
CO2: 23 mmol/L (ref 22–32)
Calcium: 8 mg/dL — ABNORMAL LOW (ref 8.9–10.3)
Chloride: 117 mmol/L — ABNORMAL HIGH (ref 101–111)
Creatinine, Ser: 0.96 mg/dL (ref 0.61–1.24)
GFR calc Af Amer: >60 mL/min (ref >60)
GFR calc non Af Amer: >60 mL/min (ref >60)
GLUCOSE: 108 mg/dL — AB (ref 65–99)
POTASSIUM: 4.1 mmol/L (ref 3.5–5.1)
SODIUM: 143 mmol/L (ref 135–145)
Total Bilirubin: 1.5 mg/dL — ABNORMAL HIGH (ref 0.3–1.2)
Total Protein: 5.7 g/dL — ABNORMAL LOW (ref 6.5–8.1)

## 2017-09-09 LAB — GLUCOSE, CAPILLARY
GLUCOSE-CAPILLARY: 106 mg/dL — AB (ref 65–99)
GLUCOSE-CAPILLARY: 98 mg/dL (ref 65–99)
GLUCOSE-CAPILLARY: 119 mg/dL — AB (ref 65–99)
Glucose-Capillary: 123 mg/dL — ABNORMAL HIGH (ref 65–99)
Glucose-Capillary: 131 mg/dL — ABNORMAL HIGH (ref 65–99)
Glucose-Capillary: 99 mg/dL (ref 65–99)

## 2017-09-09 LAB — CBC
HEMATOCRIT: 30.7 % — AB (ref 39.0–52.0)
HEMOGLOBIN: 9.9 g/dL — AB (ref 13.0–17.0)
MCH: 29.6 pg (ref 26.0–34.0)
MCHC: 32.2 g/dL (ref 30.0–36.0)
MCV: 91.9 fL (ref 78.0–100.0)
Platelets: 120 10*3/uL — ABNORMAL LOW (ref 150–400)
RBC: 3.34 MIL/uL — ABNORMAL LOW (ref 4.22–5.81)
RDW: 14.7 % (ref 11.5–15.5)
WBC: 13.6 10*3/uL — ABNORMAL HIGH (ref 4.0–10.5)

## 2017-09-09 LAB — TRIGLYCERIDES: Triglycerides: 75 mg/dL (ref <150)

## 2017-09-09 LAB — SURGICAL PCR SCREEN
MRSA, PCR: NEGATIVE
Staphylococcus aureus: NEGATIVE

## 2017-09-09 SURGERY — EXTERNAL FIXATION, LOWER EXTREMITY
Anesthesia: General | Site: Leg Lower | Laterality: Right

## 2017-09-09 MED ORDER — EPHEDRINE 5 MG/ML INJ
INTRAVENOUS | Status: AC
  Filled 2017-09-09: qty 10

## 2017-09-09 MED ORDER — POTASSIUM CHLORIDE IN NACL 20-0.9 MEQ/L-% IV SOLN
INTRAVENOUS | Status: DC | PRN
  Administered 2017-09-09 (×2): via INTRAVENOUS

## 2017-09-09 MED ORDER — MIDAZOLAM HCL 2 MG/2ML IJ SOLN
INTRAMUSCULAR | Status: AC
  Filled 2017-09-09: qty 2

## 2017-09-09 MED ORDER — ONDANSETRON HCL 4 MG/2ML IJ SOLN
INTRAMUSCULAR | Status: AC
  Filled 2017-09-09: qty 2

## 2017-09-09 MED ORDER — FENTANYL CITRATE (PF) 250 MCG/5ML IJ SOLN
INTRAMUSCULAR | Status: AC
  Filled 2017-09-09: qty 5

## 2017-09-09 MED ORDER — PIVOT 1.5 CAL PO LIQD
1000.0000 mL | ORAL | Status: DC
  Administered 2017-09-09 – 2017-09-12 (×3): 1000 mL
  Filled 2017-09-09 (×2): qty 1000

## 2017-09-09 MED ORDER — 0.9 % SODIUM CHLORIDE (POUR BTL) OPTIME
TOPICAL | Status: DC | PRN
  Administered 2017-09-09: 1000 mL

## 2017-09-09 MED ORDER — PHENYLEPHRINE 40 MCG/ML (10ML) SYRINGE FOR IV PUSH (FOR BLOOD PRESSURE SUPPORT)
PREFILLED_SYRINGE | INTRAVENOUS | Status: AC
  Filled 2017-09-09: qty 10

## 2017-09-09 MED ORDER — ONDANSETRON HCL 4 MG/2ML IJ SOLN
INTRAMUSCULAR | Status: DC | PRN
  Administered 2017-09-09: 4 mg via INTRAVENOUS

## 2017-09-09 MED ORDER — MIDAZOLAM HCL 5 MG/5ML IJ SOLN
INTRAMUSCULAR | Status: DC | PRN
  Administered 2017-09-09: 2 mg via INTRAVENOUS

## 2017-09-09 MED ORDER — LACTATED RINGERS IV SOLN
INTRAVENOUS | Status: DC | PRN
  Administered 2017-09-09: 13:00:00 via INTRAVENOUS

## 2017-09-09 MED ORDER — ROCURONIUM BROMIDE 10 MG/ML (PF) SYRINGE
PREFILLED_SYRINGE | INTRAVENOUS | Status: AC
  Filled 2017-09-09: qty 5

## 2017-09-09 MED ORDER — ROCURONIUM BROMIDE 100 MG/10ML IV SOLN
INTRAVENOUS | Status: DC | PRN
  Administered 2017-09-09: 50 mg via INTRAVENOUS

## 2017-09-09 MED ORDER — FENTANYL CITRATE (PF) 100 MCG/2ML IJ SOLN
INTRAMUSCULAR | Status: DC | PRN
  Administered 2017-09-09: 250 ug via INTRAVENOUS

## 2017-09-09 MED ORDER — CEFAZOLIN SODIUM-DEXTROSE 1-4 GM/50ML-% IV SOLN
1.0000 g | Freq: Three times a day (TID) | INTRAVENOUS | Status: AC
  Administered 2017-09-09 – 2017-09-10 (×2): 1 g via INTRAVENOUS
  Filled 2017-09-09 (×2): qty 50

## 2017-09-09 MED ORDER — LIDOCAINE 2% (20 MG/ML) 5 ML SYRINGE
INTRAMUSCULAR | Status: AC
  Filled 2017-09-09: qty 5

## 2017-09-09 MED ORDER — PROPOFOL 10 MG/ML IV BOLUS
INTRAVENOUS | Status: AC
  Filled 2017-09-09: qty 20

## 2017-09-09 MED ORDER — POTASSIUM CHLORIDE IN NACL 20-0.9 MEQ/L-% IV SOLN: INTRAVENOUS | Status: DC | PRN

## 2017-09-09 MED ORDER — CEFAZOLIN SODIUM-DEXTROSE 2-3 GM-% IV SOLR
INTRAVENOUS | Status: DC | PRN
  Administered 2017-09-09: 2 g via INTRAVENOUS

## 2017-09-09 SURGICAL SUPPLY — 73 items
BANDAGE ACE 4X5 VEL STRL LF (GAUZE/BANDAGES/DRESSINGS) ×4 IMPLANT
BANDAGE ACE 6X5 VEL STRL LF (GAUZE/BANDAGES/DRESSINGS) ×4 IMPLANT
BANDAGE ESMARK 6X9 LF (GAUZE/BANDAGES/DRESSINGS) ×2 IMPLANT
BLADE CLIPPER SURG (BLADE) IMPLANT
BNDG CMPR 9X6 STRL LF SNTH (GAUZE/BANDAGES/DRESSINGS) ×2
BNDG COHESIVE 4X5 TAN STRL (GAUZE/BANDAGES/DRESSINGS) IMPLANT
BNDG ESMARK 6X9 LF (GAUZE/BANDAGES/DRESSINGS) ×4
BNDG GAUZE ELAST 4 BULKY (GAUZE/BANDAGES/DRESSINGS) ×7 IMPLANT
BRUSH SCRUB SURG 4.25 DISP (MISCELLANEOUS) ×8 IMPLANT
COVER MAYO STAND STRL (DRAPES) ×4 IMPLANT
DRAPE C-ARM 42X72 X-RAY (DRAPES) ×4 IMPLANT
DRAPE C-ARMOR (DRAPES) ×4 IMPLANT
DRAPE HALF SHEET 40X57 (DRAPES) ×8 IMPLANT
DRAPE INCISE IOBAN 66X45 STRL (DRAPES) ×4 IMPLANT
DRAPE PROXIMA HALF (DRAPES) ×3 IMPLANT
DRAPE U-SHAPE 47X51 STRL (DRAPES) ×4 IMPLANT
DRSG ADAPTIC 3X8 NADH LF (GAUZE/BANDAGES/DRESSINGS) ×4 IMPLANT
DRSG PAD ABDOMINAL 8X10 ST (GAUZE/BANDAGES/DRESSINGS) ×16 IMPLANT
ELECT REM PT RETURN 9FT ADLT (ELECTROSURGICAL) ×4
ELECTRODE REM PT RTRN 9FT ADLT (ELECTROSURGICAL) ×2 IMPLANT
GAUZE SPONGE 4X4 12PLY STRL (GAUZE/BANDAGES/DRESSINGS) ×4 IMPLANT
GAUZE SPONGE 4X4 12PLY STRL LF (GAUZE/BANDAGES/DRESSINGS) ×3 IMPLANT
GAUZE XEROFORM 5X9 LF (GAUZE/BANDAGES/DRESSINGS) ×3 IMPLANT
GLOVE BIO SURGEON STRL SZ7.5 (GLOVE) ×4 IMPLANT
GLOVE BIO SURGEON STRL SZ8 (GLOVE) ×4 IMPLANT
GLOVE BIOGEL PI IND STRL 7.5 (GLOVE) ×2 IMPLANT
GLOVE BIOGEL PI IND STRL 8 (GLOVE) ×2 IMPLANT
GLOVE BIOGEL PI INDICATOR 7.5 (GLOVE) ×2
GLOVE BIOGEL PI INDICATOR 8 (GLOVE) ×2
GLOVE PROGUARD SZ 7 1/2 (GLOVE) ×4 IMPLANT
GLOVE SURG ORTHO 8.0 STRL STRW (GLOVE) ×3 IMPLANT
GOWN STRL REUS W/ TWL LRG LVL3 (GOWN DISPOSABLE) ×4 IMPLANT
GOWN STRL REUS W/ TWL XL LVL3 (GOWN DISPOSABLE) ×2 IMPLANT
GOWN STRL REUS W/TWL LRG LVL3 (GOWN DISPOSABLE) ×8
GOWN STRL REUS W/TWL XL LVL3 (GOWN DISPOSABLE) ×4
KIT BASIN OR (CUSTOM PROCEDURE TRAY) ×4 IMPLANT
KIT ROOM TURNOVER OR (KITS) ×4 IMPLANT
MANIFOLD NEPTUNE II (INSTRUMENTS) ×4 IMPLANT
NS IRRIG 1000ML POUR BTL (IV SOLUTION) ×4 IMPLANT
PACK ORTHO EXTREMITY (CUSTOM PROCEDURE TRAY) ×4 IMPLANT
PAD ARMBOARD 7.5X6 YLW CONV (MISCELLANEOUS) ×8 IMPLANT
PAD CAST 4YDX4 CTTN HI CHSV (CAST SUPPLIES) ×2 IMPLANT
PADDING CAST COTTON 4X4 STRL (CAST SUPPLIES) ×4
PADDING CAST COTTON 6X4 STRL (CAST SUPPLIES) ×4 IMPLANT
PIN APEX 180X50X5XST SLF (PIN) ×2 IMPLANT
PIN APEX 5X180 (PIN) ×8
PIN CLAMP 5H 30DEG POST (EXFIX) ×3 IMPLANT
PIN CLAMP 5H DIA 4X5X6M (EXFIX) ×6 IMPLANT
PIN TRANSFIX 615X300 EXFIX (EXFIX) ×6 IMPLANT
POST 30DEG ANGLED DIA 11M (EXFIX) ×6 IMPLANT
ROD CARBON (EXFIX) ×4
ROD CNCT 400X11XNS LF HFMN (EXFIX) ×1 IMPLANT
ROD HOFFMANN3 CONNECT 11X450 (EXFIX) ×3 IMPLANT
ROD TO ROD COUPLING EXFIX (EXFIX) ×12 IMPLANT
SPONGE LAP 18X18 X RAY DECT (DISPOSABLE) IMPLANT
STAPLER VISISTAT 35W (STAPLE) ×4 IMPLANT
STOCKINETTE IMPERVIOUS LG (DRAPES) IMPLANT
SUCTION FRAZIER HANDLE 10FR (MISCELLANEOUS) ×2
SUCTION TUBE FRAZIER 10FR DISP (MISCELLANEOUS) ×2 IMPLANT
SUT ETHILON 3 0 PS 1 (SUTURE) IMPLANT
SUT VIC AB 0 CT1 27 (SUTURE) ×4
SUT VIC AB 0 CT1 27XBRD ANBCTR (SUTURE) ×2 IMPLANT
SUT VIC AB 1 CT1 27 (SUTURE) ×4
SUT VIC AB 1 CT1 27XBRD ANBCTR (SUTURE) ×2 IMPLANT
SUT VIC AB 2-0 CT1 27 (SUTURE) ×8
SUT VIC AB 2-0 CT1 TAPERPNT 27 (SUTURE) ×4 IMPLANT
TOWEL OR 17X24 6PK STRL BLUE (TOWEL DISPOSABLE) ×4 IMPLANT
TOWEL OR 17X26 10 PK STRL BLUE (TOWEL DISPOSABLE) ×8 IMPLANT
TRAY FOLEY W/METER SILVER 16FR (SET/KITS/TRAYS/PACK) IMPLANT
TUBE CONNECTING 12'X1/4 (SUCTIONS) ×1
TUBE CONNECTING 12X1/4 (SUCTIONS) ×3 IMPLANT
WATER STERILE IRR 1000ML POUR (IV SOLUTION) ×8 IMPLANT
YANKAUER SUCT BULB TIP NO VENT (SUCTIONS) ×4 IMPLANT

## 2017-09-09 NOTE — Progress Notes (Signed)
Dear Doctor This patient has been identified as a candidate for PICC for the following reason (s): IV therapy over 48 hours, drug pH or osmolality (causing phlebitis, infiltration in 24 hours) and restarts due to phlebitis and infiltration in 24 hours If you agree, please write an order for the indicated device. For any questions contact the Vascular Access Team at [phone] if no answer, please leave a message.  Thank you for supporting the early vascular access assessment program. 

## 2017-09-09 NOTE — Anesthesia Preprocedure Evaluation (Addendum)
Anesthesia Evaluation  Patient identified by MRN, date of birth, ID band Patient unresponsive  General Assessment Comment:Remains intubated and sedated  Reviewed: Allergy & Precautions, NPO status , Patient's Chart, lab work & pertinent test results, Unable to perform ROS - Chart review only  History of Anesthesia Complications Negative for: history of anesthetic complications  Airway Mallampati: Intubated       Dental   Pulmonary  L first rib FX with tiny occult PTX   breath sounds clear to auscultation       Cardiovascular  Rhythm:Regular Rate:Tachycardia  Proximal right ICA acute dissection  Mid left ICA injury Proximal left vertebral artery injury   Neuro/Psych Remains intubated and sedated with C-spine fractures and Traumatic Carotid dissection  C-spine not cleared    GI/Hepatic negative GI ROS, Elevated LFTs   Endo/Other  negative endocrine ROS  Renal/GU negative Renal ROS     Musculoskeletal   Abdominal   Peds  Hematology  (+) Blood dyscrasia (Hb 9.9, plt 120k), ,   Anesthesia Other Findings   Reproductive/Obstetrics                            Anesthesia Physical Anesthesia Plan  ASA: IV  Anesthesia Plan: General   Post-op Pain Management:    Induction: Inhalational  PONV Risk Score and Plan: 2 and Ondansetron, Dexamethasone and Treatment may vary due to age or medical condition  Airway Management Planned: Oral ETT  Additional Equipment: Arterial line  Intra-op Plan:   Post-operative Plan: Post-operative intubation/ventilation  Informed Consent:   History available from chart only  Plan Discussed with: CRNA and Surgeon  Anesthesia Plan Comments: (Plan routine monitors, existing A-line, GETA with existing ETT and C-collar remaining in place Continue post op ventilation)       Anesthesia Quick Evaluation

## 2017-09-09 NOTE — Op Note (Signed)
Orthopaedic Surgery Operative Note (CSN: [id number])  [name]  1981-06-05 Date of Surgery: 09/06/2017 - 09/09/2017 Admit Date: 09/06/2017  Diagnoses:  R tibial plafond fracture  Post-Op Diagnosis: Same with additionally noted lateral knee ligament laxity, likely LCL vs PLC injury  Procedures:   * EXTERNAL FIXATION RIGHT LOWER LEG - CPT 20690   Operative Finding Successful completion of planned procedure.  Distal swelling precluded ORIF today in addition to low grade fevers.  Additionally noted Grade 1-2 laxity to varus stress indicating lateral ligamentous complex injury.  Abrasion from road noted on posterior calf without sign of open fracture  Post-operative plan: The patient will be NWB in his external fixator until swelling improves.  The patient will be sent back to ICU.  DVT prophylaxis per primary.  Pain control with PRN pain medication preferring oral medicines.  Follow up plan will be pending swelling.  Likely will need definitive fixation later this week or during the next week.  Surgeons:Primary: Bjorn Pippin, MD Location: [facility] OR ROOM 09 Anesthesia: General Antibiotics: Ancef 2g preop Tourniquet time:  None Estimated Blood Loss: <12mL Complications: None Specimens: None  Indications for Surgery:   BASSAM DRESCH is a 36 y.o. male with multiple traumatic injuries including a RLE pilon fracture.  Patient was unstable requiring mulitple interventions delaying surgery for his extremity until 3 days post hospitalization.  His condition improved however he had low grade fevers in the 24 hrs prior to surgery in addition to significant swelling precluding definitive fixation.  Benefits and risks of operative and nonoperative management were discussed prior to surgery with patient/guardian(s) and informed consent form was completed.  Planned for external fixation today with definitive fixation on a delayed basis.   Procedure:   The patient was identified in the  preoperative holding area where the surgical site was marked. The patient was taken to the OR where a procedural timeout was called and the above noted anesthesia was induced.  Preoperative antibiotics were dosed.  The patient's right lower extremity was prepped and draped in the usual sterile fashion.  A second preoperative timeout was called.     We initially used fluoroscopy to guide placement of two transverse transfixion pins in the calcaneous avoiding the neurovascular bundle starting pins centered approximately 2 cm proximal and anterior to distal tip of calcaneous.  We then placed two half pins well out of the zone of injury and eventual fixation below the tibial tubercle confirming bicortical purchase.  At that point we constructed a delta frame utilizing the [employer] system.  At that point we had a provisional frame constructed we then used fluoroscopy to guide our reduction noting that we had reapproximated length and overall alignment on AP and lateral.    There was some lateral translation of the distal fragment but pressure was much improved on the skin.  Final films were obtained and the pins were dressed in a sterile fashion with xeroform and kerlix.  The posterior abrasions were dressed as well.  Patient transported back to ICU in stable fashion.

## 2017-09-09 NOTE — Interval H&P Note (Signed)
History and Physical Interval Note:  09/09/2017 12:35 PM  Dustin Marquez  has presented today for surgery, with the diagnosis of R tibial plafond fracture  The various methods of treatment have been discussed with the family. After consideration of risks, benefits and other options for treatment, the patient has consented to  Procedure(s): OPEN REDUCTION INTERNAL FIXATION (ORIF) TIBIA FRACTURE (Right) as a surgical intervention .  The patient's history has been reviewed, patient examined, no change in status, stable for surgery.  I have reviewed the patient's chart and labs.  Questions were answered to the patient's satisfaction.     Bjorn Pippin

## 2017-09-09 NOTE — H&P (View-Only) (Signed)
Patient seen in ICU again today in regards to his RLE injuries.  Patient continues to be intubated and sedated so no history obtained.  PE: RLE in long leg splint.  Foot and ankle swollen but skin intact.  WWP foot, no motor or sensory due to intubation  AP: -Multiple small non-displaced foot fractures/avulsions, segmental fibula fracture with proximal component with some concern of PLC avulsion and distal tibial fracture extending into the joint  - Patient requires operative intervention for these injuries.  Discussed with Trauma PA team plan for OR tomorrow if that is medically appropriate. -Plan will be for ORIF distal tibia and splinting vs Ex fix pending swelling.  We additionally will perform an EUA of the knee and if the lateral ligament complex is damaged at the knee may intervene on this at the time of surgery as well.

## 2017-09-09 NOTE — Progress Notes (Signed)
Patient placed back on ventilator on full support from returning from the OR. RN at bedside as well.

## 2017-09-09 NOTE — Progress Notes (Signed)
Follow up - Trauma and Critical Care  Patient Details:    Dustin Marquez is an 36 y.o. male.  Lines/tubes : Airway 7.5 mm (Active)  Secured at (cm) 26 cm 09/09/2017  8:05 AM  Measured From Lips 09/09/2017  8:05 AM  Secured Location Center 09/09/2017  8:05 AM  Secured By [employer] 09/09/2017  8:05 AM  Tube Holder Repositioned Yes 09/09/2017  8:05 AM  Cuff Pressure (cm H2O) 24 cm H2O 09/08/2017  8:08 PM  Site Condition Dry 09/09/2017  8:05 AM     NG/OG Tube Orogastric 16 Fr. Center mouth Xray (Active)  External Length of Tube (cm) - (if applicable) 42 cm 09/08/2017  8:00 PM  Site Assessment Clean;Dry;Intact 09/09/2017  9:00 AM  Ongoing Placement Verification No change in respiratory status;No acute changes, not attributed to clinical condition;Xray 09/09/2017  9:00 AM  Status Suction-low intermittent 09/09/2017  9:00 AM  Amount of suction 60 mmHg 09/09/2017  9:00 AM  Drainage Appearance Bile 09/09/2017  9:00 AM  Intake (mL) 30 mL 09/07/2017  8:00 AM  Output (mL) 250 mL 09/09/2017  4:00 AM     Urethral Catheter [name], RN Latex;Straight-tip;Temperature probe 16 Fr. (Active)  Indication for Insertion or Continuance of Catheter Peri-operative use for selective surgical procedure 09/09/2017  7:26 AM  Site Assessment Clean;Dry;Intact 09/08/2017  8:00 AM  Catheter Maintenance Bag below level of bladder;Catheter secured;Drainage bag/tubing not touching floor;Insertion date on drainage bag;No dependent loops;Seal intact 09/09/2017  8:00 AM  Collection Container Standard drainage bag 09/08/2017  8:00 AM  Securement Method Securing device (Describe) 09/08/2017  8:00 AM  Urinary Catheter Interventions Unclamped 09/08/2017  8:00 AM  Input (mL) 0 mL 09/07/2017  8:00 AM  Output (mL) 100 mL 09/09/2017  8:00 AM    Microbiology/Sepsis markers: Results for orders placed or performed during the hospital encounter of 09/06/17  MRSA PCR Screening     Status: None   Collection Time: 09/06/17  7:25 AM   Result Value Ref Range Status   MRSA by PCR NEGATIVE NEGATIVE Final    Comment:        The GeneXpert MRSA Assay (FDA approved for NASAL specimens only), is one component of a comprehensive MRSA colonization surveillance program. It is not intended to diagnose MRSA infection nor to guide or monitor treatment for MRSA infections.   Surgical PCR screen     Status: None   Collection Time: 09/08/17 10:38 PM  Result Value Ref Range Status   MRSA, PCR NEGATIVE NEGATIVE Final   Staphylococcus aureus NEGATIVE NEGATIVE Final    Comment: (NOTE) The Xpert SA Assay (FDA approved for NASAL specimens in patients 43 years of age and older), is one component of a comprehensive surveillance program. It is not intended to diagnose infection nor to guide or monitor treatment.     Anti-infectives:  Anti-infectives    None      Best Practice/Protocols:  VTE Prophylaxis: Mechanical GI Prophylaxis: Proton Pump Inhibitor Continous Sedation but weaning on hte ventilator  Consults: Treatment Team:  Tia Alert, MD Bjorn Pippin, MD    Events:  Subjective:    Overnight Issues: Headed to surgery this AM   Objective:  Vital signs for last 24 hours: Temp:  [99.3 F (37.4 C)-102.4 F (39.1 C)] 99.3 F (37.4 C) (09/17 1000) Pulse Rate:  [62-91] 75 (09/17 1200) Resp:  [10-28] 14 (09/17 1200) BP: (106-131)/(61-81) 113/73 (09/17 1200) SpO2:  [98 %-100 %] 100 % (09/17 1200) FiO2 (%):  [  30 %-40 %] 30 % (09/17 0805)  Hemodynamic parameters for last 24 hours:    Intake/Output from previous day: 09/16 0701 - 09/17 0700 In: 2533.3 [I.V.:2533.3] Out: 1575 [Urine:1225; Emesis/NG output:350]  Intake/Output this shift: Total I/O In: 406.3 [I.V.:406.3] Out: 100 [Urine:100]  Vent settings for last 24 hours: Vent Mode: PSV;CPAP FiO2 (%):  [30 %-40 %] 30 % Set Rate:  [16 bmp] 16 bmp Vt Set:  [620 mL] 620 mL PEEP:  [5 cmH20] 5 cmH20 Pressure Support:  [8 cmH20] 8 cmH20 Plateau  Pressure:  [12 cmH20-16 cmH20] 15 cmH20  Physical Exam:  General: alert, no respiratory distress and no apparent distress while weaning on the ventilator Neuro: alert, RASS 0 and weakness right lower extremity Resp: clear to auscultation bilaterally CVS: regular rate and rhythm, S1, S2 normal, no murmur, click, rub or gallop GI: soft, nontender, BS WNL, no r/g and had been tolerating tube feedings Extremities: edema 2+ and both lower extremities are swollen in the feet.  Results for orders placed or performed during the hospital encounter of 09/06/17 (from the past 24 hour(s))  Glucose, capillary     Status: Abnormal   Collection Time: 09/08/17  3:58 PM  Result Value Ref Range   Glucose-Capillary 109 (H) 65 - 99 mg/dL  Glucose, capillary     Status: None   Collection Time: 09/08/17  7:45 PM  Result Value Ref Range   Glucose-Capillary 94 65 - 99 mg/dL  Surgical PCR screen     Status: None   Collection Time: 09/08/17 10:38 PM  Result Value Ref Range   MRSA, PCR NEGATIVE NEGATIVE   Staphylococcus aureus NEGATIVE NEGATIVE  Glucose, capillary     Status: Abnormal   Collection Time: 09/08/17 11:04 PM  Result Value Ref Range   Glucose-Capillary 100 (H) 65 - 99 mg/dL  Comprehensive metabolic panel     Status: Abnormal   Collection Time: 09/09/17  1:43 AM  Result Value Ref Range   Sodium 143 135 - 145 mmol/L   Potassium 4.1 3.5 - 5.1 mmol/L   Chloride 117 (H) 101 - 111 mmol/L   CO2 23 22 - 32 mmol/L   Glucose, Bld 108 (H) 65 - 99 mg/dL   BUN 12 6 - 20 mg/dL   Creatinine, Ser 1.61 0.61 - 1.24 mg/dL   Calcium 8.0 (L) 8.9 - 10.3 mg/dL   Total Protein 5.7 (L) 6.5 - 8.1 g/dL   Albumin 2.7 (L) 3.5 - 5.0 g/dL   AST 86 (H) 15 - 41 U/L   ALT 115 (H) 17 - 63 U/L   Alkaline Phosphatase 36 (L) 38 - 126 U/L   Total Bilirubin 1.5 (H) 0.3 - 1.2 mg/dL   GFR calc non Af Amer >60 >60 mL/min   GFR calc Af Amer >60 >60 mL/min   Anion gap 3 (L) 5 - 15  CBC     Status: Abnormal   Collection Time:  09/09/17  1:43 AM  Result Value Ref Range   WBC 13.6 (H) 4.0 - 10.5 K/uL   RBC 3.34 (L) 4.22 - 5.81 MIL/uL   Hemoglobin 9.9 (L) 13.0 - 17.0 g/dL   HCT 09.6 (L) 04.5 - 40.9 %   MCV 91.9 78.0 - 100.0 fL   MCH 29.6 26.0 - 34.0 pg   MCHC 32.2 30.0 - 36.0 g/dL   RDW 81.1 91.4 - 78.2 %   Platelets 120 (L) 150 - 400 K/uL  Glucose, capillary     Status: Abnormal  Collection Time: 09/09/17  3:53 AM  Result Value Ref Range   Glucose-Capillary 106 (H) 65 - 99 mg/dL  Triglycerides     Status: None   Collection Time: 09/09/17  7:42 AM  Result Value Ref Range   Triglycerides 75 <150 mg/dL  Glucose, capillary     Status: Abnormal   Collection Time: 09/09/17  8:25 AM  Result Value Ref Range   Glucose-Capillary 131 (H) 65 - 99 mg/dL   Comment 1 Notify RN    Comment 2 Document in Chart   Glucose, capillary     Status: None   Collection Time: 09/09/17 12:02 PM  Result Value Ref Range   Glucose-Capillary 98 65 - 99 mg/dL   Comment 1 Notify RN    Comment 2 Document in Chart      Assessment/Plan:   NEURO  Altered Mental Status:  sedation   Plan: Will continue to wean patient after surgery.  PULM  No pulmonary issues   Plan: Will try to wean after surgery.  CARDIO  No cardiac issues   Plan: CPM  RENAL  Urine output and renal function are good.   Plan: CPM  GI  No specific issues   Plan: CPM  ID  No known infectious problems.   Plan: CPM  HEME  Anemia acute blood loss anemia)   Plan: Stable hemoglobin, but I expect it may go down after surgery  ENDO No known issues   Plan: CPM  Global Issues  AFter extremity surgery today, the patient may need to have surgery on his neck.  I do not think that the possible ICA injuries are a contraindication to ACDF, but I would have to defer to NS and vascular surgery    LOS: 3 days   Additional comments:I reviewed the patient's new clinical lab test results. cbc/bmet and I reviewed the patients new imaging test results. cxr  Critical Care  Total Time*: 30 Minutes  Hyman Crossan 09/09/2017  *Care during the described time interval was provided by me and/or other providers on the critical care team.  I have reviewed this patient's available data, including medical history, events of note, physical examination and test results as part of my evaluation.

## 2017-09-09 NOTE — Progress Notes (Signed)
Patient ID: Dustin Marquez, male   DOB: August 10, 1981, 36 y.o.   MRN: [id number]  Pt sedated and intubated, no change in exam  I rec: MRI C-spine when able, would MRI brain offer any useful clinical info? Maybe, but not absolutely necessary if pt can't tolerate the time in the scanner to get it  Will likely need ORIF C-spine fxs. I would favor acdf but this may not be preferable given the ICA injuries? Then, a posterior approach would be done.

## 2017-09-09 NOTE — Transfer of Care (Signed)
Immediate Anesthesia Transfer of Care Note  Patient: Dustin Marquez  Procedure(s) Performed: Procedure(s): EXTERNAL FIXATION RIGHT LOWER LEG (Right)  Patient Location: ICU  Anesthesia Type:General  Level of Consciousness: unresponsive and Patient remains intubated per anesthesia plan  Airway & Oxygen Therapy: Patient remains intubated per anesthesia plan and Patient placed on Ventilator (see vital sign flow sheet for setting)  Post-op Assessment: Report given to RN and Post -op Vital signs reviewed and stable  Post vital signs: Reviewed and stable  Last Vitals:  Vitals:   09/09/17 1100 09/09/17 1200  BP: 112/70 113/73  Pulse: 62 75  Resp: 12 14  Temp:    SpO2: 100% 100%    Last Pain:  Vitals:   09/09/17 1000  TempSrc: Core (Comment)  PainSc:          Complications: No apparent anesthesia complications

## 2017-09-10 ENCOUNTER — Inpatient Hospital Stay (HOSPITAL_COMMUNITY): Payer: Medicaid Other

## 2017-09-10 ENCOUNTER — Encounter (HOSPITAL_COMMUNITY): Payer: Self-pay | Admitting: Orthopaedic Surgery

## 2017-09-10 LAB — TYPE AND SCREEN
ABO/RH(D): A POS
ANTIBODY SCREEN: NEGATIVE
UNIT DIVISION: 0
UNIT DIVISION: 0
UNIT DIVISION: 0
UNIT DIVISION: 0
Unit division: 0
Unit division: 0

## 2017-09-10 LAB — BPAM RBC
BLOOD PRODUCT EXPIRATION DATE: 201809232359
BLOOD PRODUCT EXPIRATION DATE: 201810012359
Blood Product Expiration Date: 201809222359
Blood Product Expiration Date: 201809242359
Blood Product Expiration Date: 201809252359
Blood Product Expiration Date: 201810012359
ISSUE DATE / TIME: 201809140141
ISSUE DATE / TIME: 201809140141
ISSUE DATE / TIME: 201809140241
ISSUE DATE / TIME: 201809140241
ISSUE DATE / TIME: 201809142345
ISSUE DATE / TIME: 201809142345
UNIT TYPE AND RH: 6200
UNIT TYPE AND RH: 6200
UNIT TYPE AND RH: 9500
UNIT TYPE AND RH: 9500
UNIT TYPE AND RH: 9500
Unit Type and Rh: 9500

## 2017-09-10 LAB — CBC WITH DIFFERENTIAL/PLATELET
BASOS ABS: 0 10*3/uL (ref 0.0–0.1)
BASOS PCT: 0 %
EOS ABS: 0.1 10*3/uL (ref 0.0–0.7)
Eosinophils Relative: 1 %
HCT: 28 % — ABNORMAL LOW (ref 39.0–52.0)
HEMOGLOBIN: 9.1 g/dL — AB (ref 13.0–17.0)
Lymphocytes Relative: 4 %
Lymphs Abs: 0.5 10*3/uL — ABNORMAL LOW (ref 0.7–4.0)
MCH: 29.5 pg (ref 26.0–34.0)
MCHC: 32.5 g/dL (ref 30.0–36.0)
MCV: 90.9 fL (ref 78.0–100.0)
MONOS PCT: 8 %
Monocytes Absolute: 1 10*3/uL (ref 0.1–1.0)
NEUTROS PCT: 87 %
Neutro Abs: 10.3 10*3/uL — ABNORMAL HIGH (ref 1.7–7.7)
Platelets: 157 10*3/uL (ref 150–400)
RBC: 3.08 MIL/uL — AB (ref 4.22–5.81)
RDW: 14.4 % (ref 11.5–15.5)
WBC: 11.9 10*3/uL — ABNORMAL HIGH (ref 4.0–10.5)

## 2017-09-10 LAB — BASIC METABOLIC PANEL
Anion gap: 4 — ABNORMAL LOW (ref 5–15)
BUN: 14 mg/dL (ref 6–20)
CHLORIDE: 115 mmol/L — AB (ref 101–111)
CO2: 24 mmol/L (ref 22–32)
CREATININE: 0.92 mg/dL (ref 0.61–1.24)
Calcium: 7.8 mg/dL — ABNORMAL LOW (ref 8.9–10.3)
GFR calc non Af Amer: >60 mL/min (ref >60)
Glucose, Bld: 129 mg/dL — ABNORMAL HIGH (ref 65–99)
Potassium: 3.9 mmol/L (ref 3.5–5.1)
SODIUM: 143 mmol/L (ref 135–145)

## 2017-09-10 LAB — GLUCOSE, CAPILLARY
GLUCOSE-CAPILLARY: 132 mg/dL — AB (ref 65–99)
GLUCOSE-CAPILLARY: 131 mg/dL — AB (ref 65–99)
GLUCOSE-CAPILLARY: 120 mg/dL — AB (ref 65–99)
Glucose-Capillary: 107 mg/dL — ABNORMAL HIGH (ref 65–99)
Glucose-Capillary: 110 mg/dL — ABNORMAL HIGH (ref 65–99)
Glucose-Capillary: 130 mg/dL — ABNORMAL HIGH (ref 65–99)

## 2017-09-10 MED ORDER — SODIUM CHLORIDE 0.9% FLUSH
10.0000 mL | INTRAVENOUS | Status: DC | PRN
  Administered 2017-09-20 – 2017-10-06 (×5): 10 mL
  Filled 2017-09-10 (×5): qty 40

## 2017-09-10 MED ORDER — SODIUM CHLORIDE 0.9% FLUSH
10.0000 mL | Freq: Two times a day (BID) | INTRAVENOUS | Status: DC
Start: 2017-09-10 — End: 2017-11-12
  Administered 2017-09-10 – 2017-09-11 (×2): 10 mL
  Administered 2017-09-11: 20 mL
  Administered 2017-09-12: 10 mL
  Administered 2017-09-12: 20 mL
  Administered 2017-09-13 (×2): 10 mL
  Administered 2017-09-14: 20 mL
  Administered 2017-09-14: 10 mL
  Administered 2017-09-15: 20 mL
  Administered 2017-09-16 – 2017-09-28 (×13): 10 mL
  Administered 2017-09-29: 3 mL
  Administered 2017-09-30 – 2017-10-20 (×20): 10 mL
  Administered 2017-10-22: 20 mL
  Administered 2017-10-23 – 2017-10-28 (×7): 10 mL
  Administered 2017-10-28: 20 mL
  Administered 2017-10-29: 10 mL
  Administered 2017-10-29: 20 mL
  Administered 2017-10-31 – 2017-11-12 (×11): 10 mL

## 2017-09-10 MED ORDER — FREE WATER
100.0000 mL | Freq: Three times a day (TID) | Status: DC
  Administered 2017-09-10 – 2017-09-19 (×18): 100 mL

## 2017-09-10 MED ORDER — CHLORHEXIDINE GLUCONATE CLOTH 2 % EX PADS
6.0000 | MEDICATED_PAD | Freq: Every day | CUTANEOUS | Status: DC
  Administered 2017-09-11 – 2017-09-22 (×9): 6 via TOPICAL

## 2017-09-10 NOTE — Progress Notes (Signed)
ORTHOPAEDIC PROGRESS NOTE  s/p Procedure(s): EXTERNAL FIXATION RIGHT LOWER LEG  SUBJECTIVE: Intubated and sedated  OBJECTIVE: PE:RLE - knee with mild effusion.  Ex fix in place, no motor or sensory exam in setting of intubated patient.  Dressing with mild strikethrough.  Patient medial skin distally at fracture site with some darkening but wrinkles starting to form.   Vitals:   09/10/17 0502 09/10/17 0600  BP: 106/70 124/63  Pulse: 98 81  Resp: 20 16  Temp: (!) 101.1 F (38.4 C) (!) 101.3 F (38.5 C)  SpO2: 100% 100%     ASSESSMENT: Dustin Marquez is a 36 y.o. male with right tibia fracture sp Ex fix and Right laterally based ligamentous injury of knee.    PLAN: -NWB RLE -Daily pin care and dressing change to posterior calf abrasion -Elevate operative extremity as tolerated to minimize swelling -Will require definitive fixation likely next week when swelling improves

## 2017-09-10 NOTE — Progress Notes (Signed)
Patient not moving left arm equally to right. Not able to squeeze to command, following commands all other extremities. Trauma and neuro surgery made aware. Neurosurgery came to look at patient, not very concerned since patient is still following commands. Will continue to monitor.

## 2017-09-10 NOTE — Progress Notes (Signed)
Patient ID: Dustin Marquez, male   DOB: Oct 30, 1981, 36 y.o.   MRN: [id number] Arouses to [facility], moves hand on R but after surgery yesterday no longer moves LUE, moves toes well to command  rec CT C-spine to eval alignment. I am unsure whether his ex-fix is MRI compatible or not, but an MRI C-spine/ brain would be nice since the new weakness could be related to the fractures or the R ICA dissection.

## 2017-09-10 NOTE — OR Nursing (Signed)
External fixator manufactured by [employer] and is verified MRI compatible.

## 2017-09-10 NOTE — Progress Notes (Signed)
Peripherally Inserted Central Catheter/Midline Placement  The IV Nurse has discussed with the patient and/or persons authorized to consent for the patient, the purpose of this procedure and the potential benefits and risks involved with this procedure.  The benefits include less needle sticks, lab draws from the catheter, and the patient may be discharged home with the catheter. Risks include, but not limited to, infection, bleeding, blood clot (thrombus formation), and puncture of an artery; nerve damage and irregular heartbeat and possibility to perform a PICC exchange if needed/ordered by physician.  Alternatives to this procedure were also discussed.  Bard Power PICC patient education guide, fact sheet on infection prevention and patient information card has been provided to patient /or left at bedside.    PICC/Midline Placement Documentation    Consent obtained by brother via telephone     Dustin Marquez 09/10/2017, 3:40 PM

## 2017-09-10 NOTE — Progress Notes (Signed)
Follow up - Trauma and Critical Care  Patient Details:    Dustin Marquez is an 36 y.o. male.  Lines/tubes : Airway 7.5 mm (Active)  Secured at (cm) 26 cm 09/10/2017  3:10 AM  Measured From Lips 09/10/2017  3:10 AM  Secured Location Right 09/10/2017  3:10 AM  Secured By [employer] 09/10/2017  3:10 AM  Tube Holder Repositioned Yes 09/10/2017  3:10 AM  Cuff Pressure (cm H2O) 26 cm H2O 09/09/2017 11:15 PM  Site Condition Dry 09/10/2017  3:10 AM     NG/OG Tube Orogastric 16 Fr. Center mouth Xray (Active)  External Length of Tube (cm) - (if applicable) 42 cm 09/08/2017  8:00 PM  Site Assessment Clean;Dry;Intact 09/10/2017  8:00 AM  Ongoing Placement Verification No change in respiratory status;No acute changes, not attributed to clinical condition;Xray 09/10/2017  8:00 AM  Status Infusing tube feed 09/10/2017  8:00 AM  Amount of suction 60 mmHg 09/09/2017  9:00 AM  Drainage Appearance Bile 09/09/2017  9:00 AM  Intake (mL) 30 mL 09/07/2017  8:00 AM  Output (mL) 125 mL 09/09/2017  5:24 PM     Urethral Catheter [name], RN Latex;Straight-tip;Temperature probe 16 Fr. (Active)  Indication for Insertion or Continuance of Catheter Other (comment) 09/10/2017  8:00 AM  Site Assessment Clean;Dry;Intact 09/09/2017 10:00 PM  Catheter Maintenance Bag below level of bladder;Catheter secured;Drainage bag/tubing not touching floor;Insertion date on drainage bag;No dependent loops;Seal intact 09/10/2017  8:00 AM  Collection Container Standard drainage bag 09/09/2017 10:00 PM  Securement Method Securing device (Describe) 09/09/2017 10:00 PM  Urinary Catheter Interventions Unclamped 09/09/2017 10:00 PM  Input (mL) 125 mL 09/10/2017 12:00 AM  Output (mL) 50 mL 09/10/2017  6:00 AM    Microbiology/Sepsis markers: Results for orders placed or performed during the hospital encounter of 09/06/17  MRSA PCR Screening     Status: None   Collection Time: 09/06/17  7:25 AM  Result Value Ref Range Status   MRSA  by PCR NEGATIVE NEGATIVE Final    Comment:        The GeneXpert MRSA Assay (FDA approved for NASAL specimens only), is one component of a comprehensive MRSA colonization surveillance program. It is not intended to diagnose MRSA infection nor to guide or monitor treatment for MRSA infections.   Surgical PCR screen     Status: None   Collection Time: 09/08/17 10:38 PM  Result Value Ref Range Status   MRSA, PCR NEGATIVE NEGATIVE Final   Staphylococcus aureus NEGATIVE NEGATIVE Final    Comment: (NOTE) The Xpert SA Assay (FDA approved for NASAL specimens in patients 73 years of age and older), is one component of a comprehensive surveillance program. It is not intended to diagnose infection nor to guide or monitor treatment.     Anti-infectives:  Anti-infectives    Start     Dose/Rate Route Frequency Ordered Stop   09/09/17 2100  ceFAZolin (ANCEF) IVPB 1 g/50 mL premix     1 g 100 mL/hr over 30 Minutes Intravenous Every 8 hours 09/09/17 1548 09/10/17 1610      Best Practice/Protocols:  VTE Prophylaxis: Lovenox (prophylaxtic dose) and Mechanical GI Prophylaxis: Proton Pump Inhibitor Continous Sedation Fentanyl only  Consults: Treatment Team:  Tia Alert, MD Bjorn Pippin, MD    Events:  Subjective:    Overnight Issues: Patient not moving his left arm as well.  Dr. Yetta Barre wants to get MRI of neck and repeat CT   Objective:  Vital signs for  last 24 hours: Temp:  [99.1 F (37.3 C)-102.6 F (39.2 C)] 100.8 F (38.2 C) (09/18 1000) Pulse Rate:  [67-99] 80 (09/18 1000) Resp:  [13-20] 16 (09/18 1000) BP: (104-146)/(56-100) 133/69 (09/18 1000) SpO2:  [98 %-100 %] 100 % (09/18 1000) FiO2 (%):  [30 %] 30 % (09/18 0310)  Hemodynamic parameters for last 24 hours:    Intake/Output from previous day: 09/17 0701 - 09/18 0700 In: 3344.3 [I.V.:2847.3; NG/GT:272; IV Piggyback:100] Out: 1575 [Urine:1450; Emesis/NG output:125]  Intake/Output this shift: Total  I/O In: 127.5 [I.V.:107.5; NG/GT:20] Out: -   Vent settings for last 24 hours: Vent Mode: PRVC FiO2 (%):  [30 %] 30 % Set Rate:  [16 bmp] 16 bmp Vt Set:  [620 mL] 620 mL PEEP:  [5 cmH20] 5 cmH20 Pressure Support:  [8 cmH20] 8 cmH20 Plateau Pressure:  [15 cmH20-20 cmH20] 20 cmH20  Physical Exam:  General: no respiratory distress Neuro: nonfocal exam and RASS -2 Resp: clear to auscultation bilaterally CVS: regular rate and rhythm, S1, S2 normal, no murmur, click, rub or gallop GI: soft, nontender, BS WNL, no r/g and tolerating tube feedings well. Extremities: Has external fixators in place  Results for orders placed or performed during the hospital encounter of 09/06/17 (from the past 24 hour(s))  Glucose, capillary     Status: None   Collection Time: 09/09/17 12:02 PM  Result Value Ref Range   Glucose-Capillary 98 65 - 99 mg/dL   Comment 1 Notify RN    Comment 2 Document in Chart   Glucose, capillary     Status: None   Collection Time: 09/09/17  3:39 PM  Result Value Ref Range   Glucose-Capillary 99 65 - 99 mg/dL   Comment 1 Notify RN    Comment 2 Document in Chart   Glucose, capillary     Status: Abnormal   Collection Time: 09/09/17  8:26 PM  Result Value Ref Range   Glucose-Capillary 119 (H) 65 - 99 mg/dL   Comment 1 Notify RN    Comment 2 Document in Chart   Glucose, capillary     Status: Abnormal   Collection Time: 09/09/17 11:42 PM  Result Value Ref Range   Glucose-Capillary 123 (H) 65 - 99 mg/dL   Comment 1 Notify RN    Comment 2 Document in Chart   CBC with Differential/Platelet     Status: Abnormal   Collection Time: 09/10/17  1:56 AM  Result Value Ref Range   WBC 11.9 (H) 4.0 - 10.5 K/uL   RBC 3.08 (L) 4.22 - 5.81 MIL/uL   Hemoglobin 9.1 (L) 13.0 - 17.0 g/dL   HCT 40.9 (L) 81.1 - 91.4 %   MCV 90.9 78.0 - 100.0 fL   MCH 29.5 26.0 - 34.0 pg   MCHC 32.5 30.0 - 36.0 g/dL   RDW 78.2 95.6 - 21.3 %   Platelets 157 150 - 400 K/uL   Neutrophils Relative % 87 %    Neutro Abs 10.3 (H) 1.7 - 7.7 K/uL   Lymphocytes Relative 4 %   Lymphs Abs 0.5 (L) 0.7 - 4.0 K/uL   Monocytes Relative 8 %   Monocytes Absolute 1.0 0.1 - 1.0 K/uL   Eosinophils Relative 1 %   Eosinophils Absolute 0.1 0.0 - 0.7 K/uL   Basophils Relative 0 %   Basophils Absolute 0.0 0.0 - 0.1 K/uL  Basic metabolic panel     Status: Abnormal   Collection Time: 09/10/17  1:56 AM  Result Value Ref Range  Sodium 143 135 - 145 mmol/L   Potassium 3.9 3.5 - 5.1 mmol/L   Chloride 115 (H) 101 - 111 mmol/L   CO2 24 22 - 32 mmol/L   Glucose, Bld 129 (H) 65 - 99 mg/dL   BUN 14 6 - 20 mg/dL   Creatinine, Ser 8.11 0.61 - 1.24 mg/dL   Calcium 7.8 (L) 8.9 - 10.3 mg/dL   GFR calc non Af Amer >60 >60 mL/min   GFR calc Af Amer >60 >60 mL/min   Anion gap 4 (L) 5 - 15  Glucose, capillary     Status: Abnormal   Collection Time: 09/10/17  4:42 AM  Result Value Ref Range   Glucose-Capillary 132 (H) 65 - 99 mg/dL  Glucose, capillary     Status: Abnormal   Collection Time: 09/10/17  8:33 AM  Result Value Ref Range   Glucose-Capillary 130 (H) 65 - 99 mg/dL   Comment 1 Notify RN    Comment 2 Document in Chart      Assessment/Plan:   NEURO  Altered Mental Status:  sedation   Plan: Not weaning on the ventilator yet  PULM  No specific issues   Plan: CPM  CARDIO  No issues   Plan: CPM  RENAL  Urine output is adequate and renal lfunction is good.   Plan: CPM  GI  No specific issues   Plan: CPM  ID  No known infectious sources   Plan: CPM  HEME  Anemia acute blood loss anemia)   Plan: CPM. No blood to be given  ENDO No specific issuesd   Plan: CPM  Global Issues  Patient needs MRI and we are in the process of determining if his ext fix is MRI compatible.  He has staples in his hed that will need to come out before MRI.   LOS: 4 days   Additional comments:I reviewed the patient's new clinical lab test results. cbc/bmet  Critical Care Total Time*: 30 Minutes  Mykeria Garman 09/10/2017  *Care during the described time interval was provided by me and/or other providers on the critical care team.  I have reviewed this patient's available data, including medical history, events of note, physical examination and test results as part of my evaluation.

## 2017-09-11 DIAGNOSIS — I63411 Cerebral infarction due to embolism of right middle cerebral artery: Secondary | ICD-10-CM

## 2017-09-11 DIAGNOSIS — I7771 Dissection of carotid artery: Secondary | ICD-10-CM

## 2017-09-11 DIAGNOSIS — G936 Cerebral edema: Secondary | ICD-10-CM

## 2017-09-11 DIAGNOSIS — G8194 Hemiplegia, unspecified affecting left nondominant side: Secondary | ICD-10-CM

## 2017-09-11 DIAGNOSIS — H53462 Homonymous bilateral field defects, left side: Secondary | ICD-10-CM

## 2017-09-11 LAB — BASIC METABOLIC PANEL
ANION GAP: 5 (ref 5–15)
BUN: 15 mg/dL (ref 6–20)
CALCIUM: 7.9 mg/dL — AB (ref 8.9–10.3)
CO2: 25 mmol/L (ref 22–32)
Chloride: 113 mmol/L — ABNORMAL HIGH (ref 101–111)
Creatinine, Ser: 0.92 mg/dL (ref 0.61–1.24)
GFR calc Af Amer: >60 mL/min (ref >60)
GLUCOSE: 136 mg/dL — AB (ref 65–99)
Potassium: 3.6 mmol/L (ref 3.5–5.1)
Sodium: 143 mmol/L (ref 135–145)

## 2017-09-11 LAB — CBC WITH DIFFERENTIAL/PLATELET
BASOS ABS: 0 10*3/uL (ref 0.0–0.1)
Basophils Relative: 0 %
EOS PCT: 2 %
Eosinophils Absolute: 0.2 10*3/uL (ref 0.0–0.7)
HEMATOCRIT: 25 % — AB (ref 39.0–52.0)
Hemoglobin: 8.1 g/dL — ABNORMAL LOW (ref 13.0–17.0)
LYMPHS PCT: 5 %
Lymphs Abs: 0.7 10*3/uL (ref 0.7–4.0)
MCH: 29.1 pg (ref 26.0–34.0)
MCHC: 32.4 g/dL (ref 30.0–36.0)
MCV: 89.9 fL (ref 78.0–100.0)
Monocytes Absolute: 1 10*3/uL (ref 0.1–1.0)
Monocytes Relative: 8 %
Neutro Abs: 10.9 10*3/uL — ABNORMAL HIGH (ref 1.7–7.7)
Neutrophils Relative %: 85 %
PLATELETS: 178 10*3/uL (ref 150–400)
RBC: 2.78 MIL/uL — ABNORMAL LOW (ref 4.22–5.81)
RDW: 14.2 % (ref 11.5–15.5)
WBC: 12.8 10*3/uL — AB (ref 4.0–10.5)

## 2017-09-11 LAB — LIPID PANEL
CHOL/HDL RATIO: 2.7 ratio
Cholesterol: 70 mg/dL (ref 0–200)
HDL: 26 mg/dL — ABNORMAL LOW (ref >40)
LDL CALC: 31 mg/dL (ref 0–99)
TRIGLYCERIDES: 67 mg/dL (ref <150)
VLDL: 13 mg/dL (ref 0–40)

## 2017-09-11 LAB — GLUCOSE, CAPILLARY
GLUCOSE-CAPILLARY: 106 mg/dL — AB (ref 65–99)
GLUCOSE-CAPILLARY: 96 mg/dL (ref 65–99)
GLUCOSE-CAPILLARY: 83 mg/dL (ref 65–99)
Glucose-Capillary: 112 mg/dL — ABNORMAL HIGH (ref 65–99)
Glucose-Capillary: 93 mg/dL (ref 65–99)

## 2017-09-11 LAB — HEMOGLOBIN A1C
Hgb A1c MFr Bld: 5.1 % (ref 4.8–5.6)
Mean Plasma Glucose: 99.67 mg/dL

## 2017-09-11 MED ORDER — CLOPIDOGREL BISULFATE 75 MG PO TABS
75.0000 mg | ORAL_TABLET | Freq: Every day | ORAL | Status: DC
  Administered 2017-09-12 – 2017-11-12 (×59): 75 mg via ORAL
  Filled 2017-09-11 (×59): qty 1

## 2017-09-11 MED ORDER — ASPIRIN 300 MG RE SUPP
300.0000 mg | Freq: Every day | RECTAL | Status: DC
  Administered 2017-09-12: 300 mg via RECTAL
  Filled 2017-09-11: qty 1

## 2017-09-11 MED ORDER — ORAL CARE MOUTH RINSE
15.0000 mL | Freq: Two times a day (BID) | OROMUCOSAL | Status: DC
  Administered 2017-09-12 – 2017-09-14 (×5): 15 mL via OROMUCOSAL

## 2017-09-11 MED ORDER — FENTANYL CITRATE (PF) 100 MCG/2ML IJ SOLN
50.0000 ug | INTRAMUSCULAR | Status: DC | PRN
  Administered 2017-09-11: 50 ug via INTRAVENOUS
  Administered 2017-09-12 (×5): 100 ug via INTRAVENOUS
  Administered 2017-09-12: 50 ug via INTRAVENOUS
  Administered 2017-09-12 – 2017-09-25 (×41): 100 ug via INTRAVENOUS
  Administered 2017-09-26: 50 ug via INTRAVENOUS
  Administered 2017-09-30 – 2017-10-08 (×21): 100 ug via INTRAVENOUS
  Administered 2017-10-09: 50 ug via INTRAVENOUS
  Administered 2017-10-09 – 2017-10-10 (×6): 100 ug via INTRAVENOUS
  Administered 2017-10-10: 50 ug via INTRAVENOUS
  Filled 2017-09-11 (×81): qty 2

## 2017-09-11 NOTE — Procedures (Signed)
Extubation Procedure Note  Patient Details:   Name: TYSHAWN CIULLO DOB: 05-27-1981 MRN: [id number]   Airway Documentation:     Evaluation  O2 sats: stable throughout Complications: No apparent complications Patient did tolerate procedure well. Bilateral Breath Sounds: Rhonchi   Pt extubated per MD order at this time, pt had +cuff leak, placed on 2L [place] tolerating well.  Pt does have weak cough but will continue to work with pt to clear secretions.   Cherylin Mylar 09/11/2017, 3:02 PM

## 2017-09-11 NOTE — Progress Notes (Signed)
Follow up - Trauma and Critical Care  Patient Details:    Dustin Marquez is an 36 y.o. male.  Lines/tubes : Airway 7.5 mm (Active)  Secured at (cm) 26 cm 09/11/2017 12:14 PM  Measured From Lips 09/11/2017 12:14 PM  Secured Location Right 09/11/2017 12:14 PM  Secured By [employer] 09/11/2017 12:14 PM  Tube Holder Repositioned Yes 09/11/2017 12:14 PM  Cuff Pressure (cm H2O) 26 cm H2O 09/11/2017 12:14 PM  Site Condition Dry 09/11/2017 12:14 PM     PICC Double Lumen 09/10/17 PICC Left Brachial 42 cm 0 cm (Active)  Indication for Insertion or Continuance of Line Prolonged intravenous therapies 09/11/2017  8:00 AM  Exposed Catheter (cm) 0 cm 09/10/2017  3:00 PM  Site Assessment Clean;Dry;Intact 09/11/2017  8:00 AM  Lumen #1 Status Infusing 09/11/2017  8:00 AM  Lumen #2 Status In-line blood sampling system in place 09/11/2017  8:00 AM  Dressing Type Transparent;Occlusive 09/11/2017  8:00 AM  Dressing Status Clean;Dry;Intact;Antimicrobial disc in place 09/11/2017  8:00 AM  Line Care Connections checked and tightened 09/11/2017  8:00 AM  Dressing Change Due 09/17/17 09/11/2017  8:00 AM     NG/OG Tube Orogastric 16 Fr. Center mouth Xray (Active)  External Length of Tube (cm) - (if applicable) 42 cm 09/08/2017  8:00 PM  Site Assessment Clean;Intact;Dry 09/11/2017  8:00 AM  Ongoing Placement Verification No change in respiratory status;No acute changes, not attributed to clinical condition 09/11/2017  8:00 AM  Status Infusing tube feed 09/11/2017  8:00 AM  Amount of suction 60 mmHg 09/09/2017  9:00 AM  Drainage Appearance Bile 09/09/2017  9:00 AM  Intake (mL) 100 mL 09/11/2017  8:26 AM  Output (mL) 125 mL 09/09/2017  5:24 PM     External Urinary Catheter (Active)  Collection Container Standard drainage bag 09/11/2017  8:00 AM  Securement Method Securing device (Describe) 09/11/2017  8:00 AM    Microbiology/Sepsis markers: Results for orders placed or performed during the hospital encounter of  09/06/17  MRSA PCR Screening     Status: None   Collection Time: 09/06/17  7:25 AM  Result Value Ref Range Status   MRSA by PCR NEGATIVE NEGATIVE Final    Comment:        The GeneXpert MRSA Assay (FDA approved for NASAL specimens only), is one component of a comprehensive MRSA colonization surveillance program. It is not intended to diagnose MRSA infection nor to guide or monitor treatment for MRSA infections.   Surgical PCR screen     Status: None   Collection Time: 09/08/17 10:38 PM  Result Value Ref Range Status   MRSA, PCR NEGATIVE NEGATIVE Final   Staphylococcus aureus NEGATIVE NEGATIVE Final    Comment: (NOTE) The Xpert SA Assay (FDA approved for NASAL specimens in patients 99 years of age and older), is one component of a comprehensive surveillance program. It is not intended to diagnose infection nor to guide or monitor treatment.     Anti-infectives:  Anti-infectives    Start     Dose/Rate Route Frequency Ordered Stop   09/09/17 2100  ceFAZolin (ANCEF) IVPB 1 g/50 mL premix     1 g 100 mL/hr over 30 Minutes Intravenous Every 8 hours 09/09/17 1548 09/10/17 1610      Best Practice/Protocols:  VTE Prophylaxis: Lovenox (prophylaxtic dose) and Mechanical GI Prophylaxis: Proton Pump Inhibitor Continous Sedation only fentanyl  Consults: Treatment Team:  Tia Alert, MD Bjorn Pippin, MD    Events:  Subjective:  Overnight Issues: No acute distress.  Eyes open and seems very connected although he does not follow commands.  Objective:  Vital signs for last 24 hours: Temp:  [98.4 F (36.9 C)-100.6 F (38.1 C)] 100.6 F (38.1 C) (09/19 1200) Pulse Rate:  [69-100] 84 (09/19 1214) Resp:  [9-23] 13 (09/19 1214) BP: (118-149)/(63-101) 146/78 (09/19 1214) SpO2:  [99 %-100 %] 100 % (09/19 1214) FiO2 (%):  [30 %] 30 % (09/19 1214)  Hemodynamic parameters for last 24 hours:    Intake/Output from previous day: 09/18 0701 - 09/19 0700 In: 3164.8  [I.V.:2584.8; NG/GT:580] Out: 1150 [Urine:1150]  Intake/Output this shift: Total I/O In: 725.3 [I.V.:525.3; NG/GT:200] Out: 525 [Urine:525]  Vent settings for last 24 hours: Vent Mode: PSV;CPAP FiO2 (%):  [30 %] 30 % Set Rate:  [16 bmp] 16 bmp Vt Set:  [829 mL] 620 mL PEEP:  [5 cmH20] 5 cmH20 Pressure Support:  [8 cmH20-12 cmH20] 8 cmH20 Plateau Pressure:  [9 cmH20-20 cmH20] 20 cmH20  Physical Exam:  General: alert and no respiratory distress Neuro: alert, nonfocal exam and weakness left upper extremity Resp: clear to auscultation bilaterally CVS: regular rate and rhythm, S1, S2 normal, no murmur, click, rub or gallop GI: soft, nontender, BS WNL, no r/g and tolerating tube feedings. Extremities: bilateral lowere extremity edema, right greater than left  Results for orders placed or performed during the hospital encounter of 09/06/17 (from the past 24 hour(s))  Glucose, capillary     Status: Abnormal   Collection Time: 09/10/17  4:06 PM  Result Value Ref Range   Glucose-Capillary 120 (H) 65 - 99 mg/dL   Comment 1 Notify RN    Comment 2 Document in Chart   Glucose, capillary     Status: Abnormal   Collection Time: 09/10/17  7:50 PM  Result Value Ref Range   Glucose-Capillary 110 (H) 65 - 99 mg/dL   Comment 1 Notify RN   Glucose, capillary     Status: Abnormal   Collection Time: 09/10/17 11:46 PM  Result Value Ref Range   Glucose-Capillary 107 (H) 65 - 99 mg/dL   Comment 1 Notify RN   CBC with Differential/Platelet     Status: Abnormal   Collection Time: 09/11/17  6:23 AM  Result Value Ref Range   WBC 12.8 (H) 4.0 - 10.5 K/uL   RBC 2.78 (L) 4.22 - 5.81 MIL/uL   Hemoglobin 8.1 (L) 13.0 - 17.0 g/dL   HCT 56.2 (L) 13.0 - 86.5 %   MCV 89.9 78.0 - 100.0 fL   MCH 29.1 26.0 - 34.0 pg   MCHC 32.4 30.0 - 36.0 g/dL   RDW 78.4 69.6 - 29.5 %   Platelets 178 150 - 400 K/uL   Neutrophils Relative % 85 %   Neutro Abs 10.9 (H) 1.7 - 7.7 K/uL   Lymphocytes Relative 5 %   Lymphs  Abs 0.7 0.7 - 4.0 K/uL   Monocytes Relative 8 %   Monocytes Absolute 1.0 0.1 - 1.0 K/uL   Eosinophils Relative 2 %   Eosinophils Absolute 0.2 0.0 - 0.7 K/uL   Basophils Relative 0 %   Basophils Absolute 0.0 0.0 - 0.1 K/uL  Basic metabolic panel     Status: Abnormal   Collection Time: 09/11/17  6:23 AM  Result Value Ref Range   Sodium 143 135 - 145 mmol/L   Potassium 3.6 3.5 - 5.1 mmol/L   Chloride 113 (H) 101 - 111 mmol/L   CO2 25 22 - 32  mmol/L   Glucose, Bld 136 (H) 65 - 99 mg/dL   BUN 15 6 - 20 mg/dL   Creatinine, Ser 1.61 0.61 - 1.24 mg/dL   Calcium 7.9 (L) 8.9 - 10.3 mg/dL   GFR calc non Af Amer >60 >60 mL/min   GFR calc Af Amer >60 >60 mL/min   Anion gap 5 5 - 15  Glucose, capillary     Status: Abnormal   Collection Time: 09/11/17  8:16 AM  Result Value Ref Range   Glucose-Capillary 112 (H) 65 - 99 mg/dL   Comment 1 Notify RN    Comment 2 Document in Chart   Glucose, capillary     Status: Abnormal   Collection Time: 09/11/17 11:34 AM  Result Value Ref Range   Glucose-Capillary 106 (H) 65 - 99 mg/dL   Comment 1 Notify RN    Comment 2 Document in Chart      Assessment/Plan:   NEURO  Altered Mental Status:  seems calm   Plan: wean to possibly extubate  PULM  No specxific issues   Plan: CPM  CARDIO  No issues   Plan: CPM  RENAL  Urinary retention.   Plan: Replace Foley.  Start urecholine and possibly Flomax  GI  No specific issues   Plan: CPM.  Hold tube feedings for possible extubation.  ID  No known infectious sources   Plan: CPM  HEME  Anemia acute blood loss anemia)   Plan: Doing okay.  No blood needed for hemoglobin of 8.8  ENDO No specific issues   Plan: CPM  Global Issues  Doing okay.  Will try to wean to extubate.  No blood for now.  CPM    LOS: 5 days   Additional comments:I reviewed the patient's new clinical lab test results. cbc/bmet and I reviewed the patients new imaging test results. cxr  Critical Care Total Time*: 30  Minutes  Ashleigh Arya 09/11/2017  *Care during the described time interval was provided by me and/or other providers on the critical care team.  I have reviewed this patient's available data, including medical history, events of note, physical examination and test results as part of my evaluation.

## 2017-09-11 NOTE — Progress Notes (Signed)
Patient ID: Dustin Marquez, male   DOB: 1981-09-01, 36 y.o.   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 I recommend stroke service consult to help manage this. He does not have an epidural process in the cervical spine and his fractures look to be stable in alignment. Given his stroke that occurred in the perioperative period and his multitrauma I do not think he is a great candidate for stabilization of his cervical fractures at this point. I think he is best managed in a collar for now but I think he may end up needing surgical stabilization at some point, when he is safe from a stroke standpoint.
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
## 2017-09-11 NOTE — Progress Notes (Signed)
150 ml wasted from fentanyl gtt bag in sink with Susy Frizzle, RN
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
## 2017-09-11 NOTE — Progress Notes (Signed)
While receiving report from AM shift RN, was told about pt situation (full report) and that pt had been extubated today - tolerating well. Was also informed that RN had gotten an order to place an NGT so pt could receive PO meds (specifically plavix). AM and PM RN's both attempted to place NGT without success (med hard resistance and pt began to tear up and asked [place] to stop). At this time, pt will not be able to receive PO meds if scheduled.  Will continue to monitor and administer meds as ordered otherwise.   Francia Greaves, RN

## 2017-09-12 ENCOUNTER — Inpatient Hospital Stay (HOSPITAL_COMMUNITY): Payer: Medicaid Other

## 2017-09-12 ENCOUNTER — Encounter (HOSPITAL_COMMUNITY): Payer: Self-pay

## 2017-09-12 LAB — GLUCOSE, CAPILLARY
Glucose-Capillary: 106 mg/dL — ABNORMAL HIGH (ref 65–99)
Glucose-Capillary: 113 mg/dL — ABNORMAL HIGH (ref 65–99)
Glucose-Capillary: 120 mg/dL — ABNORMAL HIGH (ref 65–99)
Glucose-Capillary: 82 mg/dL (ref 65–99)
Glucose-Capillary: 91 mg/dL (ref 65–99)
Glucose-Capillary: 95 mg/dL (ref 65–99)
Glucose-Capillary: 97 mg/dL (ref 65–99)

## 2017-09-12 MED ORDER — PIVOT 1.5 CAL PO LIQD
1000.0000 mL | ORAL | Status: DC
  Administered 2017-09-13 – 2017-09-17 (×6): 1000 mL
  Filled 2017-09-12 (×4): qty 1000

## 2017-09-12 MED ORDER — IOPAMIDOL (ISOVUE-370) INJECTION 76%
INTRAVENOUS | Status: AC
  Administered 2017-09-12: 90 mL
  Filled 2017-09-12: qty 100

## 2017-09-12 MED ORDER — CHLORHEXIDINE GLUCONATE 0.12 % MT SOLN
OROMUCOSAL | Status: AC
  Filled 2017-09-12: qty 15

## 2017-09-12 NOTE — Progress Notes (Signed)
Upon assessment, did not see a bruise on pt's R hip - at 2100, found a bruise along R hip/flank/back area. By 2300 the bruising had increased in size, warm/hot to the touch and upon palpation - hard to the touch. Notified on-call trauma MD of current pt situation and RN was given order for a portable hip xray 2-3 view. Order placed and awaiting xray to do the scan at this time.  Will continue to monitor.  Francia Greaves, RN
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
## 2017-09-12 NOTE — Progress Notes (Signed)
PT Cancellation Note  Patient Details Name: Dustin Marquez MRN: [id number] DOB: [birth year]   Cancelled Treatment:    Reason Eval/Treat Not Completed: Patient not medically ready. Pt currently on bedrest. Will await increased activity orders prior to initiating PT eval.    Marylynn Pearson 09/12/2017, 7:27 AM   Conni Slipper, PT, DPT Acute Rehabilitation Services Pager: [phone]

## 2017-09-12 NOTE — Progress Notes (Signed)
Rehab Admissions Coordinator Note:  Patient was screened by Trish Mage for appropriateness for an Inpatient Acute Rehab Consult. Noted SLP recommending inpatient rehab consult.   At this time, recommend awaiting PT/OT evaluations to determine functional levels.  Then can get an order for rehab consult as desired.  Lelon Frohlich M 09/12/2017, 2:24 PM  I can be reached at [phone].

## 2017-09-12 NOTE — Progress Notes (Signed)
Spoke with Trauma MD regarding NPO status and orders for plavix per Neurology. Orders given to place NG tube if patient tolerates per Dr. Brantley Stage. Attempted both nares with second RN and met resistance on both sides. Orders for speech eval in the a.m.

## 2017-09-12 NOTE — Progress Notes (Signed)
ORTHOPAEDIC PROGRESS NOTE  s/p Procedure(s): EXTERNAL FIXATION RIGHT LOWER LEG  SUBJECTIVE: Awake, following commands loosely but non-communicative.  OBJECTIVE: PE:RLE - knee with mild effusion.  Ex fix in place, patient has inconsisent ability to respond to sensory function testing but does fire FHL, no EHL function noted but incomplete assessment.  Foot held in neutral position with ex-fix.  WWP foot.  Medial skin with some darkening but swelling slowly improving.    Vitals:   09/12/17 0800 09/12/17 0900  BP:  133/70  Pulse: (!) 58 73  Resp: (!) 25 (!) 23  Temp:    SpO2: 99% 98%     ASSESSMENT: Dustin Marquez is a 36 y.o. male with right tibia fracture sp Ex fix and Right laterally based ligamentous injury of knee.    PLAN: -NWB RLE -Daily pin care and dressing change to posterior calf abrasion -Elevate operative extremity as tolerated to minimize swelling -Will require definitive fixation likely next week when swelling improves

## 2017-09-12 NOTE — Progress Notes (Signed)
Called to patients room to assess if needed NTS. RN and Dr. Lindie Spruce at bedside. Pt was 72% on 4L [place]. I increaesd to 6L and pt slowly came up to 88%. Pt mouth breathing and shallow but clear. NTS not done but pt was placed on High Flow [place] at 15L and SPo2 increased to 94%.
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
## 2017-09-12 NOTE — Progress Notes (Signed)
NEUROSURGERY PROGRESS NOTE  Patient more alert today. Able to move his left lower extremity to command. LUE moves to painful stimulus. Neurology is now following him for his new stroke.   Temp:  [99.2 F (37.3 C)-100.6 F (38.1 C)] 99.2 F (37.3 C) (09/20 0400) Pulse Rate:  [58-101] 66 (09/20 1000) Resp:  [9-30] 30 (09/20 1000) BP: (119-174)/(58-110) 154/76 (09/20 1000) SpO2:  [95 %-100 %] 99 % (09/20 1000) FiO2 (%):  [30 %] 30 % (09/19 1301)  Plan: No new recommendations from NS at this time  Sherryl Manges, NP 09/12/2017 10:37 AM
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
## 2017-09-12 NOTE — Anesthesia Postprocedure Evaluation (Signed)
Anesthesia Post Note  Patient: Dustin Marquez  Procedure(s) Performed: Procedure(s) (LRB): EXTERNAL FIXATION RIGHT LOWER LEG (Right)     Patient location during evaluation: ICU Anesthesia Type: General Level of consciousness: sedated and patient remains intubated per anesthesia plan Pain management: pain level controlled Vital Signs Assessment: post-procedure vital signs reviewed and stable Respiratory status: patient on ventilator - see flowsheet for VS and patient remains intubated per anesthesia plan Cardiovascular status: stable Postop Assessment: no apparent nausea or vomiting Anesthetic complications: no Comments:  Pt remains critically ill, with traumatic R carotid dissection, now MRI Brain showing  ischemic stroke in R MCA distribution and R MCA/PCA watershed distributions and 2mm of leftward shift.    Last Vitals:  Vitals:   09/12/17 0500 09/12/17 0600  BP: (!) 159/88 136/64  Pulse: 70 66  Resp: 19 (!) 22  Temp:    SpO2: 100% 99%    Last Pain:  Vitals:   09/12/17 0400  TempSrc: Oral  PainSc:                  Lyfe Reihl,E. Makinzi Prieur

## 2017-09-12 NOTE — Progress Notes (Signed)
Patient ID: Dustin Marquez, male   DOB: Jun 04, 1981, 36 y.o.   MRN: [id number] Ideally, I would prefer to stabilize C6 and C7 fractures through an anterior or posterior cervical approach. His ICA dissection obviously hinders the anterior approach. His recent perioperative MCA stroke and the addition of a second antiplatelet agent suggests that the risks of operative stabilization of his fractures likely outweigh the risk of neurologic compromise by managing this in a collar until antiplatelet therapy can be stopped for surgery.
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
## 2017-09-13 ENCOUNTER — Inpatient Hospital Stay (HOSPITAL_COMMUNITY): Payer: Medicaid Other

## 2017-09-13 LAB — CBC WITH DIFFERENTIAL/PLATELET
BASOS ABS: 0 10*3/uL (ref 0.0–0.1)
BASOS PCT: 0 %
Eosinophils Absolute: 0.3 10*3/uL (ref 0.0–0.7)
Eosinophils Relative: 3 %
HEMATOCRIT: 28.1 % — AB (ref 39.0–52.0)
HEMOGLOBIN: 9.3 g/dL — AB (ref 13.0–17.0)
LYMPHS PCT: 11 %
Lymphs Abs: 1.1 10*3/uL (ref 0.7–4.0)
MCH: 29.8 pg (ref 26.0–34.0)
MCHC: 33.1 g/dL (ref 30.0–36.0)
MCV: 90.1 fL (ref 78.0–100.0)
MONO ABS: 1 10*3/uL (ref 0.1–1.0)
MONOS PCT: 10 %
NEUTROS PCT: 76 %
Neutro Abs: 7.6 10*3/uL (ref 1.7–7.7)
Platelets: 288 10*3/uL (ref 150–400)
RBC: 3.12 MIL/uL — ABNORMAL LOW (ref 4.22–5.81)
RDW: 14.5 % (ref 11.5–15.5)
WBC: 10 10*3/uL (ref 4.0–10.5)

## 2017-09-13 LAB — BASIC METABOLIC PANEL
ANION GAP: 3 — AB (ref 5–15)
BUN: 20 mg/dL (ref 6–20)
CALCIUM: 8.4 mg/dL — AB (ref 8.9–10.3)
CHLORIDE: 113 mmol/L — AB (ref 101–111)
CO2: 27 mmol/L (ref 22–32)
Creatinine, Ser: 0.79 mg/dL (ref 0.61–1.24)
GFR calc non Af Amer: >60 mL/min (ref >60)
GLUCOSE: 123 mg/dL — AB (ref 65–99)
Potassium: 3.9 mmol/L (ref 3.5–5.1)
Sodium: 143 mmol/L (ref 135–145)

## 2017-09-13 LAB — GLUCOSE, CAPILLARY
GLUCOSE-CAPILLARY: 111 mg/dL — AB (ref 65–99)
GLUCOSE-CAPILLARY: 129 mg/dL — AB (ref 65–99)
Glucose-Capillary: 121 mg/dL — ABNORMAL HIGH (ref 65–99)
Glucose-Capillary: 116 mg/dL — ABNORMAL HIGH (ref 65–99)
Glucose-Capillary: 110 mg/dL — ABNORMAL HIGH (ref 65–99)

## 2017-09-13 LAB — POCT I-STAT 3, ART BLOOD GAS (G3+)
Acid-base deficit: 1 mmol/L (ref 0.0–2.0)
BICARBONATE: 24.1 mmol/L (ref 20.0–28.0)
O2 Saturation: 99 %
PH ART: 7.391 (ref 7.350–7.450)
PO2 ART: 130 mmHg — AB (ref 83.0–108.0)
TCO2: 25 mmol/L (ref 22–32)
pCO2 arterial: 39.9 mmHg (ref 32.0–48.0)

## 2017-09-13 MED ORDER — MIDAZOLAM HCL 2 MG/2ML IJ SOLN
2.0000 mg | INTRAMUSCULAR | Status: DC | PRN
  Administered 2017-09-13 – 2017-09-20 (×8): 2 mg via INTRAVENOUS
  Filled 2017-09-13 (×9): qty 2

## 2017-09-13 MED ORDER — DEXMEDETOMIDINE HCL IN NACL 200 MCG/50ML IV SOLN
0.4000 ug/kg/h | INTRAVENOUS | Status: DC
  Administered 2017-09-13 (×2): 0.6 ug/kg/h via INTRAVENOUS
  Administered 2017-09-13 (×2): 0.4 ug/kg/h via INTRAVENOUS
  Administered 2017-09-14: 0.6 ug/kg/h via INTRAVENOUS
  Administered 2017-09-14: 0.5 ug/kg/h via INTRAVENOUS
  Filled 2017-09-13 (×6): qty 50

## 2017-09-13 MED ORDER — BETHANECHOL CHLORIDE 10 MG PO TABS
10.0000 mg | ORAL_TABLET | Freq: Three times a day (TID) | ORAL | Status: DC
  Administered 2017-09-13 – 2017-09-22 (×25): 10 mg
  Filled 2017-09-13 (×33): qty 1

## 2017-09-13 MED ORDER — ASPIRIN 325 MG PO TABS
325.0000 mg | ORAL_TABLET | Freq: Every day | ORAL | Status: DC
  Administered 2017-09-13 – 2017-09-23 (×10): 325 mg
  Filled 2017-09-13 (×9): qty 1

## 2017-09-13 MED ORDER — FUROSEMIDE 10 MG/ML IJ SOLN
40.0000 mg | Freq: Once | INTRAMUSCULAR | Status: AC
  Administered 2017-09-13: 40 mg via INTRAVENOUS
  Filled 2017-09-13: qty 4

## 2017-09-13 NOTE — Progress Notes (Signed)
Called to patients room by nurse to monitor increased work of breathing. Patient had increased work of breathing with respiratory rate of 28-34 bpm. Patients Sp02 was 86-89% on 6lpm. Breath sounds scattered rhonchi, attempted to nasal tracheal suction with moderate amount of thick yellow secretions. Placed patient on bipap to decrease work of breathing. Patients respiratory rate decreased to 20-26 breaths a minute with Sp02 increasing to 96%. Will continue to monitor patients status.
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
## 2017-09-13 NOTE — Progress Notes (Signed)
SLP Cancellation Note  Patient Details Name: ERSKINE STEINFELDT MRN: [id number] DOB: 05-Sep-1981   Cancelled treatment:       Reason Eval/Treat Not Completed: Medical issues which prohibited therapy.  Pt on BiPAP, not appropriate for PO trials.  Will follow for readiness.    Blenda Mounts Laurice 09/13/2017, 9:49 AM
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
## 2017-09-13 NOTE — Progress Notes (Signed)
Cortrak Tube Team Note:  Consult received to place a Cortrak feeding tube.   A 10 F Cortrak tube was placed in the R nare and secured with a nasal bridle at 73 cm. Per the Cortrak monitor reading the tube tip is stomach.  RN assisted as pt was moving in bed during placement.   No x-ray is required. RN may begin using tube.   If the tube becomes dislodged please keep the tube and contact the Cortrak team at [email or web address] (password [email or web address]) for replacement.  If after hours and replacement cannot be delayed, place a NG tube and confirm placement with an abdominal x-ray.    Kendell Bane RD, LDN, CNSC [phone] Pager [phone] After Hours Pager
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
## 2017-09-13 NOTE — Progress Notes (Signed)
Trauma Service Note  Subjective: Patient having some difficulty with ventiltion and oxygenation this AM resulting in him getting BiPAP started.  ABG on BiPAP is good  Objective: Vital signs in last 24 hours: Temp:  [97.2 F (36.2 C)-100.6 F (38.1 C)] 97.2 F (36.2 C) (09/21 0800) Pulse Rate:  [29-94] 46 (09/21 0800) Resp:  [19-38] 23 (09/21 0800) BP: (109-174)/(50-90) 140/74 (09/21 0800) SpO2:  [87 %-100 %] 100 % (09/21 0800) FiO2 (%):  [50 %-60 %] 50 % (09/21 0730) Last BM Date: 09/13/17  Intake/Output from previous day: 09/20 0701 - 09/21 0700 In: 3493.3 [I.V.:2454.3; NG/GT:1039] Out: 1550 [Urine:1550] Intake/Output this shift: Total I/O In: 107.4 [I.V.:107.4] Out: 100 [Urine:100]  General: No distress currently.  Very somnolent but gets agitated very easily.  Lungs: Clear.  CXR pending.  Abd: Benign, but not getting tube feedings with BiPAP  Extremities: No changes.  Ext. Fixation on the right.   Neuro: The same neurologically.  Not moving LUE well.  Lab Results: CBC   Recent Labs  09/11/17 0623  WBC 12.8*  HGB 8.1*  HCT 25.0*  PLT 178   BMET  Recent Labs  09/11/17 0623  NA 143  K 3.6  CL 113*  CO2 25  GLUCOSE 136*  BUN 15  CREATININE 0.92  CALCIUM 7.9*   PT/INR No results for input(s): LABPROT, INR in the last 72 hours. 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 Evaluate for possible stroke and/or ischemic penumbra amenable revascularization EXAM: CT ANGIOGRAPHY HEAD AND NECK CT PERFUSION BRAIN TECHNIQUE: Multidetector CT imaging of the head and neck was performed using the standard protocol during bolus administration of  intravenous contrast. Multiplanar CT image reconstructions and MIPs were obtained to evaluate the vascular anatomy. Carotid stenosis measurements (when applicable) are obtained utilizing NASCET criteria, using the distal internal carotid diameter as the denominator. Multiphase CT imaging of the brain was performed following IV bolus contrast injection. Subsequent parametric perfusion maps were calculated using RAPID software. CONTRAST:  90 cc of Isovue 370. COMPARISON:  Comparison made with previous MRI from 09/10/2017 as well as prior angiogram and CTA from 09/06/2017 FINDINGS: CT HEAD FINDINGS Brain: Previously identified subacute right MCA distribution an watershed territory infarcts again seen, overall stable in distribution relative to previous MRI. No evidence for hemorrhagic transformation or other complication. No significant mass effect. No other new acute large vessel territory infarct. No intracranial hemorrhage. No mass lesion or significant midline shift. Ventricles stable in size without hydrocephalus. There has been interval development of a small subdural hygroma overlying the left cerebral convexity measuring up to 5 mm in maximal thickness (series 8, image 34). No significant mass effect. Vascular: No hyperdense vessel. Skull: Resolving right-sided scalp contusions with skin staples in place. Calvarium intact. Sinuses/Orbits: Globes and oval soft tissues within normal limits. Scattered polypoid mucosal thickening within the sphenoid, ethmoidal, and maxillary sinuses. Nasogastric tube partially visualized. Small bilateral mastoid effusions noted. Other: None. CTA NECK FINDINGS Aortic arch: Visualized aortic arch of normal caliber with normal branch pattern. No flow-limiting stenosis about the origin of the great vessels. Visualized subclavian artery is widely patent. Right carotid system: Right common carotid artery widely patent from  its origin to the bifurcation without stenosis or vascular  abnormality. Carotid bifurcation widely patent. One vascular injury involving the right ICA again seen with multifocal irregularity and stenoses. Appearance near the carotid bulb is improved with diminished irregularity at this level. Persistent near occlusion at the distal third, also slightly improved (Series 13, image 80). Area of involvement now spans approximately 4 cm. Persistent diminished downstream flowed to the level of the terminus, although this appears slightly improved as well as compared to prior CTA. No appreciable intraluminal thrombus. Finding consistent with BCVI grade II. Left carotid system: Left common carotid artery widely patent without stenosis or vascular injury. Left carotid bifurcation widely patent. Multifocal intimal irregularity involving the mid- distal third of the left ICA with no more than 25% stenosis, consistent with grade I BCVI. Overall area of involvement measures approximately 4.5 cm in length. Overall, appearance is also slightly improved in appearance as compared to prior CTA. No intraluminal thrombus identified. Vertebral arteries: Previously noted mild luminal irregularity involving the proximal left vertebral artery is markedly improved in now essentially resolved, with no significant irregularity now identified. Vertebral artery's patent within the neck without new abnormality or stenosis. Skeleton: Previously identified fractures involving the left C6-7 facet with associated grade 1 anterolisthesis of C6 on C7 again noted, stable from previous. No new osseous abnormality. Other neck: Enteric tube in place. Increase retropharyngeal edema/ effusion, which may be related to trauma and/ or intubation. Upper chest: Layering secretions noted within the subglottic trachea, suggesting at this patient is at risk for aspiration. Layering bilateral pleural effusions with associated atelectasis. New multifocal consolidative opacities within the anterior left upper lobe (series 14,  image 353), suspicious for possible infection Review of the MIP images confirms the above findings CTA HEAD FINDINGS Anterior circulation: There remains diminished flow within the right ICA to the level of the terminus, overall slightly improved relative to previous CTA. Left ICA widely patent without stenosis. Left MCA and its branch vessels well perfused without stenosis or occlusion. A1 segments and anterior communicating artery widely patent. Bilateral ACA is well perfused right M1 segment well perfused and fairly symmetric with the left, likely receding collateral flow cross circle of Willis and from the posterior circulation. Right M1 segment widely patent. No proximal M2 occlusion. There has most likely been interval re- cannulization. Distal right MCA branches well perfused. Posterior circulation: Vertebral artery's widely patent to the vertebrobasilar junction without stenosis. Patent posterior inferior cerebral arteries bilaterally. Basilar artery widely patent to its distal aspect. Superior cerebellar arteries patent bilaterally. Both posterior cerebral arteries well perfused to their distal aspects. Bilateral posterior communicating arteries noted, right larger than left. Venous sinuses: Patent. Anatomic variants: None significant. Delayed phase: Not performed. Review of the MIP images confirms the above findings CT Brain Perfusion Findings: CBF (<30%) Volume: 0mL Perfusion (Tmax>6.0s) volume: 0mL Mismatch Volume: 0mL Infarction Location:No evidence for acute ischemic infarct. Known subacute right MCA infarct not visualized, likely due to re- cannulization and the normalized CBF. Delayed perfusion (T-max > 4 seconds, volume = 26 mL) noted at the posterior right MCA/PCA watershed region. Additionally, there is suggestion of delayed perfusion throughout the remaining watershed distribution on T-max perfusion map. Finding likely due to the underlying right ICA stenosis. IMPRESSION: 1. No evidence for new  acute ischemic infarct. Evolving right cerebral infarcts stable in appearance without evidence for hemorrhagic transformation or other complication. 2. Nonvisualization of known subacute right MCA/watershed infarcts, likely due to interval recanalization and normalized CBF. No ischemic penumbra.  3. Persistent delayed perfusion at the right MCA watershed distribution, likely due to underlying right ICA stenosis. 4. Persistent but improved appearance of bilateral ICA BCVI, grade II on the right, grade I on the left. Proximal grade I left vertebral artery injury markedly improved/essentially resolved. 5. Interval development of 5 mm left cerebral convexity subdural hygroma without mass effect. 6. Layering bilateral pleural effusions with associated atelectasis. 7. New patchy left upper lobe opacities, concerning for possible infection. Vascular findings were discussed in person with Dr. Pearlean Brownie near the time of dictation by Dr. Tiburcio Pea on 09/12/2017. Electronically Signed   By: Rise Mu M.D.   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 Evaluate for possible stroke and/or ischemic penumbra amenable revascularization EXAM: CT ANGIOGRAPHY HEAD AND NECK CT PERFUSION BRAIN TECHNIQUE: Multidetector CT imaging of the head and neck was performed using the standard protocol during bolus administration of intravenous contrast. Multiplanar CT image reconstructions and MIPs were obtained to evaluate the vascular anatomy. Carotid stenosis measurements (when applicable) are obtained utilizing NASCET criteria, using the distal internal carotid diameter as the denominator. Multiphase CT imaging of the brain was performed following IV bolus contrast  injection. Subsequent parametric perfusion maps were calculated using RAPID software. CONTRAST:  90 cc of Isovue 370. COMPARISON:  Comparison made with previous MRI from 09/10/2017 as well as prior angiogram and CTA from 09/06/2017 FINDINGS: CT HEAD FINDINGS Brain: Previously identified subacute right MCA distribution an watershed territory infarcts again seen, overall stable in distribution relative to previous MRI. No evidence for hemorrhagic transformation or other complication. No significant mass effect. No other new acute large vessel territory infarct. No intracranial hemorrhage. No mass lesion or significant midline shift. Ventricles stable in size without hydrocephalus. There has been interval development of a small subdural hygroma overlying the left cerebral convexity measuring up to 5 mm in maximal thickness (series 8, image 34). No significant mass effect. Vascular: No hyperdense vessel. Skull: Resolving right-sided scalp contusions with skin staples in place. Calvarium intact. Sinuses/Orbits: Globes and oval soft tissues within normal limits. Scattered polypoid mucosal thickening within the sphenoid, ethmoidal, and maxillary sinuses. Nasogastric tube partially visualized. Small bilateral mastoid effusions noted. Other: None. CTA NECK FINDINGS Aortic arch: Visualized aortic arch of normal caliber with normal branch pattern. No flow-limiting stenosis about the origin of the great vessels. Visualized subclavian artery is widely patent. Right carotid system: Right common carotid artery widely patent from its origin to the bifurcation without stenosis or vascular abnormality. Carotid bifurcation widely patent. One vascular injury involving the right ICA again seen with multifocal irregularity and stenoses. Appearance near the carotid bulb is improved with diminished irregularity at this level. Persistent near occlusion at the distal third, also slightly improved (Series 13, image 80). Area of involvement  now spans approximately 4 cm. Persistent diminished downstream flowed to the level of the terminus, although this appears slightly improved as well as compared to prior CTA. No appreciable intraluminal thrombus. Finding consistent with BCVI grade II. Left carotid system: Left common carotid artery widely patent without stenosis or vascular injury. Left carotid bifurcation widely patent. Multifocal intimal irregularity involving the mid- distal third of the left ICA with no more than 25% stenosis, consistent with grade I BCVI. Overall area of  involvement measures approximately 4.5 cm in length. Overall, appearance is also slightly improved in appearance as compared to prior CTA. No intraluminal thrombus identified. Vertebral arteries: Previously noted mild luminal irregularity involving the proximal left vertebral artery is markedly improved in now essentially resolved, with no significant irregularity now identified. Vertebral artery's patent within the neck without new abnormality or stenosis. Skeleton: Previously identified fractures involving the left C6-7 facet with associated grade 1 anterolisthesis of C6 on C7 again noted, stable from previous. No new osseous abnormality. Other neck: Enteric tube in place. Increase retropharyngeal edema/ effusion, which may be related to trauma and/ or intubation. Upper chest: Layering secretions noted within the subglottic trachea, suggesting at this patient is at risk for aspiration. Layering bilateral pleural effusions with associated atelectasis. New multifocal consolidative opacities within the anterior left upper lobe (series 14, image 353), suspicious for possible infection Review of the MIP images confirms the above findings CTA HEAD FINDINGS Anterior circulation: There remains diminished flow within the right ICA to the level of the terminus, overall slightly improved relative to previous CTA. Left ICA widely patent without stenosis. Left MCA and its branch vessels  well perfused without stenosis or occlusion. A1 segments and anterior communicating artery widely patent. Bilateral ACA is well perfused right M1 segment well perfused and fairly symmetric with the left, likely receding collateral flow cross circle of Willis and from the posterior circulation. Right M1 segment widely patent. No proximal M2 occlusion. There has most likely been interval re- cannulization. Distal right MCA branches well perfused. Posterior circulation: Vertebral artery's widely patent to the vertebrobasilar junction without stenosis. Patent posterior inferior cerebral arteries bilaterally. Basilar artery widely patent to its distal aspect. Superior cerebellar arteries patent bilaterally. Both posterior cerebral arteries well perfused to their distal aspects. Bilateral posterior communicating arteries noted, right larger than left. Venous sinuses: Patent. Anatomic variants: None significant. Delayed phase: Not performed. Review of the MIP images confirms the above findings CT Brain Perfusion Findings: CBF (<30%) Volume: 0mL Perfusion (Tmax>6.0s) volume: 0mL Mismatch Volume: 0mL Infarction Location:No evidence for acute ischemic infarct. Known subacute right MCA infarct not visualized, likely due to re- cannulization and the normalized CBF. Delayed perfusion (T-max > 4 seconds, volume = 26 mL) noted at the posterior right MCA/PCA watershed region. Additionally, there is suggestion of delayed perfusion throughout the remaining watershed distribution on T-max perfusion map. Finding likely due to the underlying right ICA stenosis. IMPRESSION: 1. No evidence for new acute ischemic infarct. Evolving right cerebral infarcts stable in appearance without evidence for hemorrhagic transformation or other complication. 2. Nonvisualization of known subacute right MCA/watershed infarcts, likely due to interval recanalization and normalized CBF. No ischemic penumbra. 3. Persistent delayed perfusion at the right MCA  watershed distribution, likely due to underlying right ICA stenosis. 4. Persistent but improved appearance of bilateral ICA BCVI, grade II on the right, grade I on the left. Proximal grade I left vertebral artery injury markedly improved/essentially resolved. 5. Interval development of 5 mm left cerebral convexity subdural hygroma without mass effect. 6. Layering bilateral pleural effusions with associated atelectasis. 7. New patchy left upper lobe opacities, concerning for possible infection. Vascular findings were discussed in person with Dr. Pearlean Brownie near the time of dictation by Dr. Tiburcio Pea on 09/12/2017. Electronically Signed   By: Rise Mu M.D.   On: 09/12/2017 15:26   Ct Cerebral Perfusion W Contrast  Result Date: 09/12/2017 CLINICAL DATA:  Clearance extraluminal with history of cervical spine injury and known bilateral carotid injuries with  subsequent right MCA infarct, now with new right hemi paresis and concern for possible left brain infarct. Evaluate for possible stroke and/or ischemic penumbra amenable revascularization EXAM: CT ANGIOGRAPHY HEAD AND NECK CT PERFUSION BRAIN TECHNIQUE: Multidetector CT imaging of the head and neck was performed using the standard protocol during bolus administration of intravenous contrast. Multiplanar CT image reconstructions and MIPs were obtained to evaluate the vascular anatomy. Carotid stenosis measurements (when applicable) are obtained utilizing NASCET criteria, using the distal internal carotid diameter as the denominator. Multiphase CT imaging of the brain was performed following IV bolus contrast injection. Subsequent parametric perfusion maps were calculated using RAPID software. CONTRAST:  90 cc of Isovue 370. COMPARISON:  Comparison made with previous MRI from 09/10/2017 as well as prior angiogram and CTA from 09/06/2017 FINDINGS: CT HEAD FINDINGS Brain: Previously identified subacute right MCA distribution an watershed territory infarcts  again seen, overall stable in distribution relative to previous MRI. No evidence for hemorrhagic transformation or other complication. No significant mass effect. No other new acute large vessel territory infarct. No intracranial hemorrhage. No mass lesion or significant midline shift. Ventricles stable in size without hydrocephalus. There has been interval development of a small subdural hygroma overlying the left cerebral convexity measuring up to 5 mm in maximal thickness (series 8, image 34). No significant mass effect. Vascular: No hyperdense vessel. Skull: Resolving right-sided scalp contusions with skin staples in place. Calvarium intact. Sinuses/Orbits: Globes and oval soft tissues within normal limits. Scattered polypoid mucosal thickening within the sphenoid, ethmoidal, and maxillary sinuses. Nasogastric tube partially visualized. Small bilateral mastoid effusions noted. Other: None. CTA NECK FINDINGS Aortic arch: Visualized aortic arch of normal caliber with normal branch pattern. No flow-limiting stenosis about the origin of the great vessels. Visualized subclavian artery is widely patent. Right carotid system: Right common carotid artery widely patent from its origin to the bifurcation without stenosis or vascular abnormality. Carotid bifurcation widely patent. One vascular injury involving the right ICA again seen with multifocal irregularity and stenoses. Appearance near the carotid bulb is improved with diminished irregularity at this level. Persistent near occlusion at the distal third, also slightly improved (Series 13, image 80). Area of involvement now spans approximately 4 cm. Persistent diminished downstream flowed to the level of the terminus, although this appears slightly improved as well as compared to prior CTA. No appreciable intraluminal thrombus. Finding consistent with BCVI grade II. Left carotid system: Left common carotid artery widely patent without stenosis or vascular injury. Left  carotid bifurcation widely patent. Multifocal intimal irregularity involving the mid- distal third of the left ICA with no more than 25% stenosis, consistent with grade I BCVI. Overall area of involvement measures approximately 4.5 cm in length. Overall, appearance is also slightly improved in appearance as compared to prior CTA. No intraluminal thrombus identified. Vertebral arteries: Previously noted mild luminal irregularity involving the proximal left vertebral artery is markedly improved in now essentially resolved, with no significant irregularity now identified. Vertebral artery's patent within the neck without new abnormality or stenosis. Skeleton: Previously identified fractures involving the left C6-7 facet with associated grade 1 anterolisthesis of C6 on C7 again noted, stable from previous. No new osseous abnormality. Other neck: Enteric tube in place. Increase retropharyngeal edema/ effusion, which may be related to trauma and/ or intubation. Upper chest: Layering secretions noted within the subglottic trachea, suggesting at this patient is at risk for aspiration. Layering bilateral pleural effusions with associated atelectasis. New multifocal consolidative opacities within the anterior left upper lobe (series  14, image 353), suspicious for possible infection Review of the MIP images confirms the above findings CTA HEAD FINDINGS Anterior circulation: There remains diminished flow within the right ICA to the level of the terminus, overall slightly improved relative to previous CTA. Left ICA widely patent without stenosis. Left MCA and its branch vessels well perfused without stenosis or occlusion. A1 segments and anterior communicating artery widely patent. Bilateral ACA is well perfused right M1 segment well perfused and fairly symmetric with the left, likely receding collateral flow cross circle of Willis and from the posterior circulation. Right M1 segment widely patent. No proximal M2 occlusion.  There has most likely been interval re- cannulization. Distal right MCA branches well perfused. Posterior circulation: Vertebral artery's widely patent to the vertebrobasilar junction without stenosis. Patent posterior inferior cerebral arteries bilaterally. Basilar artery widely patent to its distal aspect. Superior cerebellar arteries patent bilaterally. Both posterior cerebral arteries well perfused to their distal aspects. Bilateral posterior communicating arteries noted, right larger than left. Venous sinuses: Patent. Anatomic variants: None significant. Delayed phase: Not performed. Review of the MIP images confirms the above findings CT Brain Perfusion Findings: CBF (<30%) Volume: 0mL Perfusion (Tmax>6.0s) volume: 0mL Mismatch Volume: 0mL Infarction Location:No evidence for acute ischemic infarct. Known subacute right MCA infarct not visualized, likely due to re- cannulization and the normalized CBF. Delayed perfusion (T-max > 4 seconds, volume = 26 mL) noted at the posterior right MCA/PCA watershed region. Additionally, there is suggestion of delayed perfusion throughout the remaining watershed distribution on T-max perfusion map. Finding likely due to the underlying right ICA stenosis. IMPRESSION: 1. No evidence for new acute ischemic infarct. Evolving right cerebral infarcts stable in appearance without evidence for hemorrhagic transformation or other complication. 2. Nonvisualization of known subacute right MCA/watershed infarcts, likely due to interval recanalization and normalized CBF. No ischemic penumbra. 3. Persistent delayed perfusion at the right MCA watershed distribution, likely due to underlying right ICA stenosis. 4. Persistent but improved appearance of bilateral ICA BCVI, grade II on the right, grade I on the left. Proximal grade I left vertebral artery injury markedly improved/essentially resolved. 5. Interval development of 5 mm left cerebral convexity subdural hygroma without mass effect.  6. Layering bilateral pleural effusions with associated atelectasis. 7. New patchy left upper lobe opacities, concerning for possible infection. Vascular findings were discussed in person with Dr. Pearlean Brownie near the time of dictation by Dr. Tiburcio Pea on 09/12/2017. Electronically Signed   By: Rise Mu M.D.   On: 09/12/2017 15:26   Dg Chest Port 1 View  Result Date: 09/12/2017 CLINICAL DATA:  Hypoxemia.  Trauma. EXAM: PORTABLE CHEST 1 VIEW COMPARISON:  09/08/2017 FINDINGS: Endotracheal tube removed. NG tube enters the stomach. Interval placement of left subclavian central venous catheter with the tip in the SVC. No pneumothorax Hypoventilation with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

## 2017-09-14 ENCOUNTER — Inpatient Hospital Stay (HOSPITAL_COMMUNITY): Payer: Medicaid Other

## 2017-09-14 DIAGNOSIS — J90 Pleural effusion, not elsewhere classified: Secondary | ICD-10-CM

## 2017-09-14 LAB — GLUCOSE, CAPILLARY
GLUCOSE-CAPILLARY: 134 mg/dL — AB (ref 65–99)
GLUCOSE-CAPILLARY: 146 mg/dL — AB (ref 65–99)
GLUCOSE-CAPILLARY: 109 mg/dL — AB (ref 65–99)
GLUCOSE-CAPILLARY: 113 mg/dL — AB (ref 65–99)
Glucose-Capillary: 126 mg/dL — ABNORMAL HIGH (ref 65–99)
Glucose-Capillary: 123 mg/dL — ABNORMAL HIGH (ref 65–99)

## 2017-09-14 LAB — CBC WITH DIFFERENTIAL/PLATELET
BASOS ABS: 0 10*3/uL (ref 0.0–0.1)
Basophils Relative: 0 %
Eosinophils Absolute: 0.5 10*3/uL (ref 0.0–0.7)
Eosinophils Relative: 5 %
HEMATOCRIT: 27.9 % — AB (ref 39.0–52.0)
Hemoglobin: 9.1 g/dL — ABNORMAL LOW (ref 13.0–17.0)
LYMPHS ABS: 1.1 10*3/uL (ref 0.7–4.0)
LYMPHS PCT: 11 %
MCH: 29 pg (ref 26.0–34.0)
MCHC: 32.6 g/dL (ref 30.0–36.0)
MCV: 88.9 fL (ref 78.0–100.0)
Monocytes Absolute: 1 10*3/uL (ref 0.1–1.0)
Monocytes Relative: 9 %
NEUTROS ABS: 7.8 10*3/uL — AB (ref 1.7–7.7)
Neutrophils Relative %: 75 %
Platelets: 320 10*3/uL (ref 150–400)
RBC: 3.14 MIL/uL — AB (ref 4.22–5.81)
RDW: 13.9 % (ref 11.5–15.5)
WBC: 10.4 10*3/uL (ref 4.0–10.5)

## 2017-09-14 LAB — BASIC METABOLIC PANEL
ANION GAP: 3 — AB (ref 5–15)
BUN: 20 mg/dL (ref 6–20)
CHLORIDE: 112 mmol/L — AB (ref 101–111)
CO2: 26 mmol/L (ref 22–32)
Calcium: 7.7 mg/dL — ABNORMAL LOW (ref 8.9–10.3)
Creatinine, Ser: 0.7 mg/dL (ref 0.61–1.24)
GFR calc Af Amer: >60 mL/min (ref >60)
GFR calc non Af Amer: >60 mL/min (ref >60)
GLUCOSE: 153 mg/dL — AB (ref 65–99)
POTASSIUM: 3.5 mmol/L (ref 3.5–5.1)
Sodium: 141 mmol/L (ref 135–145)

## 2017-09-14 LAB — ECHOCARDIOGRAM COMPLETE
Height: 70 in
Weight: 2606.72 oz

## 2017-09-14 MED ORDER — POTASSIUM CHLORIDE 10 MEQ/50ML IV SOLN
10.0000 meq | INTRAVENOUS | Status: AC
  Administered 2017-09-14 (×2): 10 meq via INTRAVENOUS
  Filled 2017-09-14 (×2): qty 50

## 2017-09-14 MED ORDER — FUROSEMIDE 10 MG/ML IJ SOLN
40.0000 mg | Freq: Once | INTRAMUSCULAR | Status: AC
  Administered 2017-09-14: 40 mg via INTRAVENOUS
  Filled 2017-09-14: qty 4

## 2017-09-14 MED ORDER — DEXMEDETOMIDINE HCL IN NACL 400 MCG/100ML IV SOLN
0.4000 ug/kg/h | INTRAVENOUS | Status: DC
  Administered 2017-09-14: 0.5 ug/kg/h via INTRAVENOUS
  Administered 2017-09-15: 0.3 ug/kg/h via INTRAVENOUS
  Administered 2017-09-15: 0.5 ug/kg/h via INTRAVENOUS
  Administered 2017-09-16: 0.6 ug/kg/h via INTRAVENOUS
  Filled 2017-09-14 (×4): qty 100

## 2017-09-14 MED ORDER — ORAL CARE MOUTH RINSE
15.0000 mL | Freq: Two times a day (BID) | OROMUCOSAL | Status: DC
  Administered 2017-09-15 – 2017-10-12 (×41): 15 mL via OROMUCOSAL

## 2017-09-14 MED ORDER — PANTOPRAZOLE SODIUM 40 MG PO PACK
40.0000 mg | PACK | Freq: Every day | ORAL | Status: DC
  Administered 2017-09-15 – 2017-09-17 (×3): 40 mg
  Filled 2017-09-14 (×4): qty 20

## 2017-09-14 MED ORDER — CHLORHEXIDINE GLUCONATE 0.12 % MT SOLN
15.0000 mL | Freq: Two times a day (BID) | OROMUCOSAL | Status: DC
  Administered 2017-09-15 – 2017-10-12 (×50): 15 mL via OROMUCOSAL
  Filled 2017-09-14 (×45): qty 15
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
## 2017-09-14 NOTE — Progress Notes (Signed)
  Echocardiogram 2D Echocardiogram has been performed.  Dustin Marquez 09/14/2017, 2:24 PM
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
## 2017-09-15 ENCOUNTER — Inpatient Hospital Stay (HOSPITAL_COMMUNITY): Payer: Medicaid Other

## 2017-09-15 LAB — GLUCOSE, CAPILLARY
GLUCOSE-CAPILLARY: 82 mg/dL (ref 65–99)
GLUCOSE-CAPILLARY: 92 mg/dL (ref 65–99)
Glucose-Capillary: 107 mg/dL — ABNORMAL HIGH (ref 65–99)
Glucose-Capillary: 121 mg/dL — ABNORMAL HIGH (ref 65–99)
Glucose-Capillary: 126 mg/dL — ABNORMAL HIGH (ref 65–99)
Glucose-Capillary: 129 mg/dL — ABNORMAL HIGH (ref 65–99)

## 2017-09-15 LAB — CBC
HEMATOCRIT: 28.7 % — AB (ref 39.0–52.0)
HEMOGLOBIN: 9.3 g/dL — AB (ref 13.0–17.0)
MCH: 28.8 pg (ref 26.0–34.0)
MCHC: 32.4 g/dL (ref 30.0–36.0)
MCV: 88.9 fL (ref 78.0–100.0)
Platelets: 408 10*3/uL — ABNORMAL HIGH (ref 150–400)
RBC: 3.23 MIL/uL — ABNORMAL LOW (ref 4.22–5.81)
RDW: 13.9 % (ref 11.5–15.5)
WBC: 11.4 10*3/uL — ABNORMAL HIGH (ref 4.0–10.5)

## 2017-09-15 LAB — BASIC METABOLIC PANEL
Anion gap: 3 — ABNORMAL LOW (ref 5–15)
BUN: 18 mg/dL (ref 6–20)
CHLORIDE: 108 mmol/L (ref 101–111)
CO2: 27 mmol/L (ref 22–32)
CREATININE: 0.74 mg/dL (ref 0.61–1.24)
Calcium: 7.8 mg/dL — ABNORMAL LOW (ref 8.9–10.3)
GFR calc Af Amer: >60 mL/min (ref >60)
GFR calc non Af Amer: >60 mL/min (ref >60)
GLUCOSE: 135 mg/dL — AB (ref 65–99)
Potassium: 3.7 mmol/L (ref 3.5–5.1)
Sodium: 138 mmol/L (ref 135–145)

## 2017-09-15 MED ORDER — DIPHENHYDRAMINE HCL 50 MG/ML IJ SOLN
25.0000 mg | Freq: Four times a day (QID) | INTRAMUSCULAR | Status: DC | PRN
  Administered 2017-09-15 – 2017-10-06 (×5): 25 mg via INTRAVENOUS
  Filled 2017-09-15 (×5): qty 1
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
## 2017-09-16 DIAGNOSIS — D72829 Elevated white blood cell count, unspecified: Secondary | ICD-10-CM

## 2017-09-16 LAB — CBC
HEMATOCRIT: 27.6 % — AB (ref 39.0–52.0)
HEMOGLOBIN: 9.1 g/dL — AB (ref 13.0–17.0)
MCH: 29.4 pg (ref 26.0–34.0)
MCHC: 33 g/dL (ref 30.0–36.0)
MCV: 89.3 fL (ref 78.0–100.0)
Platelets: 456 10*3/uL — ABNORMAL HIGH (ref 150–400)
RBC: 3.09 MIL/uL — ABNORMAL LOW (ref 4.22–5.81)
RDW: 14.3 % (ref 11.5–15.5)
WBC: 13.1 10*3/uL — ABNORMAL HIGH (ref 4.0–10.5)

## 2017-09-16 LAB — GLUCOSE, CAPILLARY
GLUCOSE-CAPILLARY: 76 mg/dL (ref 65–99)
GLUCOSE-CAPILLARY: 81 mg/dL (ref 65–99)
GLUCOSE-CAPILLARY: 92 mg/dL (ref 65–99)
Glucose-Capillary: 119 mg/dL — ABNORMAL HIGH (ref 65–99)
Glucose-Capillary: 122 mg/dL — ABNORMAL HIGH (ref 65–99)
Glucose-Capillary: 96 mg/dL (ref 65–99)

## 2017-09-16 LAB — BASIC METABOLIC PANEL
Anion gap: 4 — ABNORMAL LOW (ref 5–15)
BUN: 17 mg/dL (ref 6–20)
CHLORIDE: 109 mmol/L (ref 101–111)
CO2: 25 mmol/L (ref 22–32)
CREATININE: 0.76 mg/dL (ref 0.61–1.24)
Calcium: 7.8 mg/dL — ABNORMAL LOW (ref 8.9–10.3)
GFR calc Af Amer: >60 mL/min (ref >60)
GFR calc non Af Amer: >60 mL/min (ref >60)
Glucose, Bld: 122 mg/dL — ABNORMAL HIGH (ref 65–99)
Potassium: 3.8 mmol/L (ref 3.5–5.1)
Sodium: 138 mmol/L (ref 135–145)

## 2017-09-16 MED ORDER — HYDROCORTISONE 1 % EX CREA
TOPICAL_CREAM | Freq: Three times a day (TID) | CUTANEOUS | Status: DC
  Administered 2017-09-16 – 2017-09-24 (×18): via TOPICAL
  Administered 2017-09-25: 1 via TOPICAL
  Administered 2017-09-25 – 2017-10-03 (×19): via TOPICAL
  Administered 2017-10-03: 1 via TOPICAL
  Administered 2017-10-04 – 2017-11-05 (×39): via TOPICAL
  Administered 2017-11-06: 1 via TOPICAL
  Administered 2017-11-07 – 2017-11-10 (×6): via TOPICAL
  Administered 2017-11-11 (×2): 1 via TOPICAL
  Filled 2017-09-16 (×2): qty 28

## 2017-09-16 MED ORDER — HALOPERIDOL LACTATE 5 MG/ML IJ SOLN
5.0000 mg | Freq: Four times a day (QID) | INTRAMUSCULAR | Status: DC | PRN
  Administered 2017-09-17 – 2017-09-25 (×11): 5 mg via INTRAVENOUS
  Filled 2017-09-16 (×13): qty 1
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
## 2017-09-16 NOTE — Progress Notes (Signed)
ORTHOPAEDIC PROGRESS NOTE  s/p Procedure(s): EXTERNAL FIXATION RIGHT LOWER LEG  SUBJECTIVE: Resting this morning, following commands loosely but non-communicative.  OBJECTIVE: PE:RLE - knee with mild effusion.  Ex fix in place at ankle, patient has inconsisent ability to respond to sensory function testing but does fire FHL, no EHL function noted but incomplete assessment.  Foot held in neutral position with ex-fix.  WWP foot.  Medial skin with some darkening but this is markedly improved from last week, still swollen  Vitals:   09/16/17 0300 09/16/17 0400  BP: (!) 115/56 128/72  Pulse: (!) 57 66  Resp: (!) 25 12  Temp:  98.1 F (36.7 C)  SpO2: 96% 98%     ASSESSMENT: Dustin Marquez is a 36 y.o. male with right tibia fracture sp Ex fix and Right laterally based ligamentous injury of knee.    PLAN: -NWB RLE -Daily pin care and dressing change to posterior calf abrasion -Elevate operative extremity as tolerated to minimize swelling -Will require definitive fixation when stable for surgery from medical standpoint. Could plan for later this week or early next.
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
## 2017-09-17 ENCOUNTER — Encounter (HOSPITAL_COMMUNITY): Payer: Self-pay

## 2017-09-17 LAB — GLUCOSE, CAPILLARY
GLUCOSE-CAPILLARY: 86 mg/dL (ref 65–99)
GLUCOSE-CAPILLARY: 94 mg/dL (ref 65–99)
GLUCOSE-CAPILLARY: 100 mg/dL — AB (ref 65–99)
Glucose-Capillary: 126 mg/dL — ABNORMAL HIGH (ref 65–99)
Glucose-Capillary: 111 mg/dL — ABNORMAL HIGH (ref 65–99)
Glucose-Capillary: 98 mg/dL (ref 65–99)

## 2017-09-17 MED ORDER — RESOURCE THICKENUP CLEAR PO POWD
Freq: Once | ORAL | Status: AC
  Administered 2017-09-17: 15:00:00 via ORAL
  Filled 2017-09-17: qty 125

## 2017-09-17 MED ORDER — QUETIAPINE FUMARATE 25 MG PO TABS
25.0000 mg | ORAL_TABLET | Freq: Three times a day (TID) | ORAL | Status: DC | PRN
  Administered 2017-09-17 – 2017-09-21 (×3): 25 mg
  Filled 2017-09-17 (×3): qty 1
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
## 2017-09-17 NOTE — Progress Notes (Signed)
Patient ID: Dustin Marquez, male   DOB: December 28, 1980, 36 y.o.   MRN: [id number] 8 Days Post-Op  Subjective: Itchy from CHG wipes   Objective: Vital signs in last 24 hours: Temp:  [98.4 F (36.9 C)-99.5 F (37.5 C)] 99.3 F (37.4 C) (09/25 0800) Pulse Rate:  [64-149] 79 (09/25 0700) Resp:  [19-34] 30 (09/25 0700) BP: (98-152)/(40-108) 104/49 (09/25 0700) SpO2:  [91 %-100 %] 100 % (09/25 0700) Last BM Date: 09/14/17  Intake/Output from previous day: 09/24 0701 - 09/25 0700 In: 3969.2 [I.V.:2409.2; NG/GT:1540] Out: 6025 [Urine:6025] Intake/Output this shift: No intake/output data recorded.  General appearance: cooperative Resp: clear to auscultation bilaterally Cardio: regular rate and rhythm GI: soft, NT  Neuro: see stroke team note  Lab Results: CBC   Recent Labs  09/15/17 0532 09/16/17 0500  WBC 11.4* 13.1*  HGB 9.3* 9.1*  HCT 28.7* 27.6*  PLT 408* 456*   BMET  Recent Labs  09/15/17 0532 09/16/17 0500  NA 138 138  K 3.7 3.8  CL 108 109  CO2 27 25  GLUCOSE 135* 122*  BUN 18 17  CREATININE 0.74 0.76  CALCIUM 7.8* 7.8*   PT/INR No results for input(s): LABPROT, INR in the last 72 hours. ABG No results for input(s): PHART, HCO3 in the last 72 hours.  Invalid input(s): PCO2, PO2  Studies/Results: No results found.  Anti-infectives: Anti-infectives    Start     Dose/Rate Route Frequency Ordered Stop   09/09/17 2100  ceFAZolin (ANCEF) IVPB 1 g/50 mL premix     1 g 100 mL/hr over 30 Minutes Intravenous Every 8 hours 09/09/17 1548 09/10/17 7829      Assessment/Plan: PHBC Concussion C6 FX with cord injury and epidural hematoma - per Dr. Yetta Barre Blunt cerebrovascular injury including grade 4 right internal carotid dissection, grade 3 left internal carotid injury with 3 mm pseudoaneurysm and grade 1 left vertebral artery injury at the level of the fracture - has progressed to large R MCA infarct and possible L brain infarct vs cord injury associated  with C6 FX. Dual antiplatelet TX per Stroke Service.  L first rib FX with tiny occult PTX Forehead and scalp lacs - closed in ED R elbow abrasions  R tib fib FX - ex fix by Dr. Everardo Pacific 9/17 FEN - TF, FEES today by speech therapy Dispo - SDU  LOS: 11 days    Violeta Gelinas, MD, MPH, FACS Trauma: [phone] General Surgery: [phone]  09/17/2017

## 2017-09-17 NOTE — Progress Notes (Signed)
Pt. Refusing PIV start.  Did allow to assess both arms and appears to be poor veinous access for PIV.  Recommend keep PICC for now.
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
## 2017-09-17 NOTE — Progress Notes (Signed)
PT Cancellation Note  Patient Details Name: KASSIUS BATTISTE MRN: [id number] DOB: [birth year]   Cancelled Treatment:    Reason Eval/Treat Not Completed: Other (comment) SLP FEES treatment in progress.  PT will come back later if able. Thanks  Neomia Dear, [place] #[phone] office Klea Nall 09/17/2017, 2:14 PM
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
## 2017-09-18 ENCOUNTER — Inpatient Hospital Stay (HOSPITAL_COMMUNITY): Payer: Medicaid Other

## 2017-09-18 LAB — BASIC METABOLIC PANEL
ANION GAP: 8 (ref 5–15)
BUN: 13 mg/dL (ref 6–20)
CALCIUM: 8.2 mg/dL — AB (ref 8.9–10.3)
CO2: 25 mmol/L (ref 22–32)
CREATININE: 0.76 mg/dL (ref 0.61–1.24)
Chloride: 104 mmol/L (ref 101–111)
Glucose, Bld: 105 mg/dL — ABNORMAL HIGH (ref 65–99)
Potassium: 3.7 mmol/L (ref 3.5–5.1)
Sodium: 137 mmol/L (ref 135–145)

## 2017-09-18 LAB — GLUCOSE, CAPILLARY
GLUCOSE-CAPILLARY: 104 mg/dL — AB (ref 65–99)
GLUCOSE-CAPILLARY: 136 mg/dL — AB (ref 65–99)
GLUCOSE-CAPILLARY: 93 mg/dL (ref 65–99)
Glucose-Capillary: 86 mg/dL (ref 65–99)
Glucose-Capillary: 89 mg/dL (ref 65–99)
Glucose-Capillary: 90 mg/dL (ref 65–99)

## 2017-09-18 MED ORDER — PANTOPRAZOLE SODIUM 40 MG PO TBEC
40.0000 mg | DELAYED_RELEASE_TABLET | Freq: Every day | ORAL | Status: DC
  Administered 2017-09-18 – 2017-11-12 (×53): 40 mg via ORAL
  Filled 2017-09-18 (×52): qty 1
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
## 2017-09-19 ENCOUNTER — Inpatient Hospital Stay (HOSPITAL_COMMUNITY): Payer: Medicaid Other | Admitting: Certified Registered Nurse Anesthetist

## 2017-09-19 ENCOUNTER — Encounter (HOSPITAL_COMMUNITY): Admission: EM | Disposition: A | Discharge status: Skilled Nursing Facility | Payer: Self-pay | Source: Home / Self Care

## 2017-09-19 ENCOUNTER — Inpatient Hospital Stay (HOSPITAL_COMMUNITY): Payer: Medicaid Other

## 2017-09-19 HISTORY — PX: ORIF TIBIA FRACTURE: SHX5416

## 2017-09-19 LAB — CBC
HEMATOCRIT: 30.7 % — AB (ref 39.0–52.0)
HEMOGLOBIN: 10 g/dL — AB (ref 13.0–17.0)
MCH: 29.2 pg (ref 26.0–34.0)
MCHC: 32.6 g/dL (ref 30.0–36.0)
MCV: 89.5 fL (ref 78.0–100.0)
Platelets: 564 10*3/uL — ABNORMAL HIGH (ref 150–400)
RBC: 3.43 MIL/uL — AB (ref 4.22–5.81)
RDW: 14.7 % (ref 11.5–15.5)
WBC: 17.8 10*3/uL — AB (ref 4.0–10.5)

## 2017-09-19 LAB — BASIC METABOLIC PANEL
ANION GAP: 7 (ref 5–15)
BUN: 9 mg/dL (ref 6–20)
CHLORIDE: 103 mmol/L (ref 101–111)
CO2: 25 mmol/L (ref 22–32)
CREATININE: 0.75 mg/dL (ref 0.61–1.24)
Calcium: 8.7 mg/dL — ABNORMAL LOW (ref 8.9–10.3)
GFR calc non Af Amer: >60 mL/min (ref >60)
Glucose, Bld: 96 mg/dL (ref 65–99)
POTASSIUM: 3.8 mmol/L (ref 3.5–5.1)
SODIUM: 135 mmol/L (ref 135–145)

## 2017-09-19 LAB — GLUCOSE, CAPILLARY
GLUCOSE-CAPILLARY: 103 mg/dL — AB (ref 65–99)
GLUCOSE-CAPILLARY: 83 mg/dL (ref 65–99)
Glucose-Capillary: 98 mg/dL (ref 65–99)

## 2017-09-19 SURGERY — OPEN REDUCTION INTERNAL FIXATION (ORIF) TIBIA FRACTURE
Anesthesia: General | Site: Leg Lower | Laterality: Right

## 2017-09-19 MED ORDER — LIDOCAINE 2% (20 MG/ML) 5 ML SYRINGE
INTRAMUSCULAR | Status: AC
  Filled 2017-09-19: qty 5

## 2017-09-19 MED ORDER — DEXAMETHASONE SODIUM PHOSPHATE 10 MG/ML IJ SOLN
INTRAMUSCULAR | Status: DC | PRN
  Administered 2017-09-19: 10 mg via INTRAVENOUS

## 2017-09-19 MED ORDER — HYDROMORPHONE HCL 1 MG/ML IJ SOLN
0.2500 mg | INTRAMUSCULAR | Status: DC | PRN
  Administered 2017-09-19 (×3): 0.5 mg via INTRAVENOUS

## 2017-09-19 MED ORDER — BUPIVACAINE HCL (PF) 0.25 % IJ SOLN
INTRAMUSCULAR | Status: DC | PRN
  Administered 2017-09-19: 30 mL

## 2017-09-19 MED ORDER — SODIUM CHLORIDE 0.9 % IR SOLN
Status: DC | PRN
  Administered 2017-09-19: 1000 mL

## 2017-09-19 MED ORDER — MIDAZOLAM HCL 2 MG/2ML IJ SOLN
INTRAMUSCULAR | Status: AC
  Filled 2017-09-19: qty 2

## 2017-09-19 MED ORDER — HYDROMORPHONE HCL 1 MG/ML IJ SOLN
INTRAMUSCULAR | Status: AC
  Administered 2017-09-19: 0.5 mg via INTRAVENOUS
  Filled 2017-09-19: qty 1

## 2017-09-19 MED ORDER — PROPOFOL 10 MG/ML IV BOLUS
INTRAVENOUS | Status: AC
  Filled 2017-09-19: qty 20

## 2017-09-19 MED ORDER — ONDANSETRON HCL 4 MG/2ML IJ SOLN
INTRAMUSCULAR | Status: AC
  Filled 2017-09-19: qty 2

## 2017-09-19 MED ORDER — PROPOFOL 10 MG/ML IV BOLUS
INTRAVENOUS | Status: DC | PRN
  Administered 2017-09-19: 150 mg via INTRAVENOUS

## 2017-09-19 MED ORDER — DEXAMETHASONE SODIUM PHOSPHATE 10 MG/ML IJ SOLN
INTRAMUSCULAR | Status: AC
  Filled 2017-09-19: qty 1

## 2017-09-19 MED ORDER — ROCURONIUM BROMIDE 100 MG/10ML IV SOLN
INTRAVENOUS | Status: DC | PRN
  Administered 2017-09-19: 40 mg via INTRAVENOUS
  Administered 2017-09-19: 10 mg via INTRAVENOUS

## 2017-09-19 MED ORDER — VANCOMYCIN HCL 500 MG IV SOLR
INTRAVENOUS | Status: DC | PRN
  Administered 2017-09-19: 500 mg

## 2017-09-19 MED ORDER — FENTANYL CITRATE (PF) 250 MCG/5ML IJ SOLN
INTRAMUSCULAR | Status: AC
  Filled 2017-09-19: qty 5

## 2017-09-19 MED ORDER — ONDANSETRON HCL 4 MG/2ML IJ SOLN
INTRAMUSCULAR | Status: DC | PRN
  Administered 2017-09-19: 4 mg via INTRAVENOUS

## 2017-09-19 MED ORDER — BUPIVACAINE HCL (PF) 0.25 % IJ SOLN
INTRAMUSCULAR | Status: AC
  Filled 2017-09-19: qty 30

## 2017-09-19 MED ORDER — PROMETHAZINE HCL 25 MG/ML IJ SOLN: 6.2500 mg | INTRAMUSCULAR | Status: DC | PRN

## 2017-09-19 MED ORDER — SUGAMMADEX SODIUM 200 MG/2ML IV SOLN
INTRAVENOUS | Status: DC | PRN
  Administered 2017-09-19: 150 mg via INTRAVENOUS

## 2017-09-19 MED ORDER — FENTANYL CITRATE (PF) 100 MCG/2ML IJ SOLN
INTRAMUSCULAR | Status: DC | PRN
  Administered 2017-09-19: 50 ug via INTRAVENOUS
  Administered 2017-09-19 (×2): 100 ug via INTRAVENOUS

## 2017-09-19 MED ORDER — LACTATED RINGERS IV SOLN
INTRAVENOUS | Status: DC
  Administered 2017-09-19 – 2017-10-20 (×4): via INTRAVENOUS

## 2017-09-19 MED ORDER — HYDROMORPHONE HCL 1 MG/ML IJ SOLN
INTRAMUSCULAR | Status: AC
  Filled 2017-09-19: qty 1

## 2017-09-19 MED ORDER — CEFAZOLIN SODIUM-DEXTROSE 2-4 GM/100ML-% IV SOLN
INTRAVENOUS | Status: AC
  Filled 2017-09-19: qty 100

## 2017-09-19 MED ORDER — CEFAZOLIN SODIUM-DEXTROSE 2-4 GM/100ML-% IV SOLN
2.0000 g | Freq: Three times a day (TID) | INTRAVENOUS | Status: AC
  Administered 2017-09-19 – 2017-09-20 (×3): 2 g via INTRAVENOUS
  Filled 2017-09-19 (×3): qty 100

## 2017-09-19 MED ORDER — VANCOMYCIN HCL 500 MG IV SOLR
INTRAVENOUS | Status: AC
  Filled 2017-09-19: qty 500

## 2017-09-19 MED ORDER — MIDAZOLAM HCL 2 MG/2ML IJ SOLN
INTRAMUSCULAR | Status: DC | PRN
  Administered 2017-09-19: 2 mg via INTRAVENOUS

## 2017-09-19 MED ORDER — PHENYLEPHRINE HCL 10 MG/ML IJ SOLN
INTRAMUSCULAR | Status: DC | PRN
  Administered 2017-09-19: 120 ug via INTRAVENOUS
  Administered 2017-09-19: 80 ug via INTRAVENOUS
  Administered 2017-09-19: 120 ug via INTRAVENOUS

## 2017-09-19 MED ORDER — CEFAZOLIN SODIUM-DEXTROSE 2-3 GM-% IV SOLR
2.0000 g | Freq: Once | INTRAVENOUS | Status: AC
  Administered 2017-09-19: 2 g via INTRAVENOUS
  Filled 2017-09-19: qty 50

## 2017-09-19 MED ORDER — MEPERIDINE HCL 25 MG/ML IJ SOLN: 6.2500 mg | INTRAMUSCULAR | Status: DC | PRN

## 2017-09-19 SURGICAL SUPPLY — 83 items
BANDAGE ACE 4X5 VEL STRL LF (GAUZE/BANDAGES/DRESSINGS) ×4 IMPLANT
BANDAGE ACE 6X5 VEL STRL LF (GAUZE/BANDAGES/DRESSINGS) ×2 IMPLANT
BANDAGE ESMARK 6X9 LF (GAUZE/BANDAGES/DRESSINGS) ×1 IMPLANT
BIT DRILL 2.5 (BIT) ×2
BIT DRILL 2.5MM (BIT) ×1
BIT DRILL 3.2MM (BIT) ×1
BIT DRILL LOCK MED 3.1X238 (BIT) ×1 IMPLANT
BIT DRILL SHRT 216X2.5XNS LF (BIT) IMPLANT
BIT DRL SHRT 216X2.5XNS LF (BIT) ×1
BNDG CMPR 9X6 STRL LF SNTH (GAUZE/BANDAGES/DRESSINGS) ×1
BNDG COHESIVE 6X5 TAN STRL LF (GAUZE/BANDAGES/DRESSINGS) ×2 IMPLANT
BNDG ESMARK 6X9 LF (GAUZE/BANDAGES/DRESSINGS) ×3
CANISTER SUCT 3000ML PPV (MISCELLANEOUS) ×3 IMPLANT
CLOSURE WOUND 1/2 X4 (GAUZE/BANDAGES/DRESSINGS)
COVER SURGICAL LIGHT HANDLE (MISCELLANEOUS) ×3 IMPLANT
CUFF TOURNIQUET SINGLE 24IN (TOURNIQUET CUFF) ×2 IMPLANT
CUFF TOURNIQUET SINGLE 34IN LL (TOURNIQUET CUFF) IMPLANT
DRAPE C-ARM 42X72 X-RAY (DRAPES) ×2 IMPLANT
DRAPE C-ARMOR (DRAPES) ×2 IMPLANT
DRAPE ORTHO SPLIT 77X108 STRL (DRAPES) ×3
DRAPE SURG ORHT 6 SPLT 77X108 (DRAPES) IMPLANT
DRAPE U-SHAPE 47X51 STRL (DRAPES) ×2 IMPLANT
DRSG PAD ABDOMINAL 8X10 ST (GAUZE/BANDAGES/DRESSINGS) ×6 IMPLANT
ELECT REM PT RETURN 9FT ADLT (ELECTROSURGICAL) ×3
ELECTRODE REM PT RTRN 9FT ADLT (ELECTROSURGICAL) ×1 IMPLANT
GAUZE SPONGE 4X4 12PLY STRL (GAUZE/BANDAGES/DRESSINGS) ×1 IMPLANT
GAUZE XEROFORM 5X9 LF (GAUZE/BANDAGES/DRESSINGS) ×2 IMPLANT
GLOVE BIO SURGEON STRL SZ7 (GLOVE) ×2 IMPLANT
GLOVE BIO SURGEON STRL SZ7.5 (GLOVE) ×6 IMPLANT
GLOVE BIO SURGEON STRL SZ8 (GLOVE) ×3 IMPLANT
GLOVE BIOGEL PI IND STRL 7.0 (GLOVE) IMPLANT
GLOVE BIOGEL PI IND STRL 8 (GLOVE) ×2 IMPLANT
GLOVE BIOGEL PI INDICATOR 7.0 (GLOVE) ×2
GLOVE BIOGEL PI INDICATOR 8 (GLOVE) ×8
GOWN SPEC L3 XXLG W/TWL (GOWN DISPOSABLE) ×2 IMPLANT
GOWN STRL REUS W/ TWL LRG LVL3 (GOWN DISPOSABLE) ×2 IMPLANT
GOWN STRL REUS W/ TWL XL LVL3 (GOWN DISPOSABLE) ×1 IMPLANT
GOWN STRL REUS W/TWL LRG LVL3 (GOWN DISPOSABLE) ×3
GOWN STRL REUS W/TWL XL LVL3 (GOWN DISPOSABLE) ×3
K-WIRE 2.0 (WIRE) ×6
K-WIRE FX234X2XDRL TIP NS (WIRE) ×2
KIT BASIN OR (CUSTOM PROCEDURE TRAY) ×3 IMPLANT
KIT PREVENA INCISION MGT 13 (CANNISTER) ×2 IMPLANT
KIT ROOM TURNOVER OR (KITS) ×3 IMPLANT
KWIRE FX234X2XDRL TIP NS (WIRE) IMPLANT
NEEDLE 22X1 1/2 (OR ONLY) (NEEDLE) ×3 IMPLANT
NS IRRIG 1000ML POUR BTL (IV SOLUTION) ×3 IMPLANT
PACK ORTHO EXTREMITY (CUSTOM PROCEDURE TRAY) ×3 IMPLANT
PAD ARMBOARD 7.5X6 YLW CONV (MISCELLANEOUS) ×6 IMPLANT
PAD CAST 4YDX4 CTTN HI CHSV (CAST SUPPLIES) ×2 IMPLANT
PADDING CAST COTTON 4X4 STRL (CAST SUPPLIES) ×3
PADDING CAST COTTON 6X4 STRL (CAST SUPPLIES) ×2 IMPLANT
PLATE TIBIA DIST MED 10H LEFT (Plate) ×2 IMPLANT
SCREW CORT 28X3.5XST LCK NS (Screw) IMPLANT
SCREW CORT 34X3.5XST LCK NS (Screw) IMPLANT
SCREW CORT 55X3.5XNS LF (Screw) IMPLANT
SCREW CORTICAL 3.5X28MM (Screw) ×6 IMPLANT
SCREW CORTICAL 3.5X34MM (Screw) ×3 IMPLANT
SCREW CORTICAL 3.5X55MM (Screw) ×6 IMPLANT
SCREW LOCKING 4.0MMX36MM (Screw) ×2 IMPLANT
SCREW LOCKING 4.0X46MM (Screw) ×2 IMPLANT
SPONGE LAP 18X18 X RAY DECT (DISPOSABLE) ×3 IMPLANT
STRIP CLOSURE SKIN 1/2X4 (GAUZE/BANDAGES/DRESSINGS) ×1 IMPLANT
SUCTION FRAZIER HANDLE 10FR (MISCELLANEOUS) ×2
SUCTION TUBE FRAZIER 10FR DISP (MISCELLANEOUS) ×1 IMPLANT
SUT ETHILON 2 0 FS 18 (SUTURE) ×4 IMPLANT
SUT ETHILON 3 0 PS 1 (SUTURE) ×6 IMPLANT
SUT MNCRL AB 4-0 PS2 18 (SUTURE) ×3 IMPLANT
SUT MON AB 2-0 CT1 27 (SUTURE) IMPLANT
SUT VIC AB 0 CT1 27 (SUTURE) ×3
SUT VIC AB 0 CT1 27XBRD ANBCTR (SUTURE) IMPLANT
SUT VIC AB 2-0 FS1 27 (SUTURE) ×2 IMPLANT
SYR BULB IRRIGATION 50ML (SYRINGE) ×3 IMPLANT
SYR CONTROL 10ML LL (SYRINGE) ×2 IMPLANT
TOWEL OR 17X24 6PK STRL BLUE (TOWEL DISPOSABLE) ×3 IMPLANT
TOWEL OR 17X26 10 PK STRL BLUE (TOWEL DISPOSABLE) ×3 IMPLANT
TUBE CONNECTING 12'X1/4 (SUCTIONS) ×1
TUBE CONNECTING 12X1/4 (SUCTIONS) ×2 IMPLANT
UNDERPAD 30X30 (UNDERPADS AND DIAPERS) ×6 IMPLANT
WATER STERILE IRR 1000ML POUR (IV SOLUTION) ×3 IMPLANT
WND VAC CANISTER 500ML (MISCELLANEOUS) ×2 IMPLANT
YANKAUER SUCT BULB TIP NO VENT (SUCTIONS) IMPLANT
locking screw 4.0mmx 42mm ×2 IMPLANT
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
## 2017-09-19 NOTE — Interval H&P Note (Signed)
History and Physical Interval Note:  09/19/2017 9:52 AM  Dustin Marquez  has presented today for surgery, with the diagnosis of Right tibia fracture distal  The various methods of treatment have been discussed with the patient and his brother Thayer Ohm. After consideration of risks, benefits and other options for treatment, the patient has consented to  Procedure(s): OPEN REDUCTION INTERNAL FIXATION (ORIF) TIBIA FRACTURE (Right) as a surgical intervention .  The patient's history has been reviewed, patient examined, no change in status, stable for surgery.  I have reviewed the patient's chart and labs.  Questions were answered to the patient's satisfaction.    I specifically discussed with the patient and Thayer Ohm the increased risk of infection, skin breakdown and perioperative morbidity and mortality in the setting of his neurologic concerns previously.  They both understood these risks and elected to proceed. Bjorn Pippin

## 2017-09-19 NOTE — H&P (View-Only) (Signed)
Orthopedics consulted for right distal tibia fracture.  Patient was splinted in the ER and reported to have a closed injury per trauma team.  He was OTF for a procedure/test at the time of attempted evaluation.  Orthopedics will attempt to see the patient on a delayed basis for further care. 

## 2017-09-19 NOTE — Progress Notes (Signed)
Patient ID: Dustin Marquez, male   DOB: 12/16/1981, 37 y.o.   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 Could consider a halo, but these don;t fixate the lower c-spine like they do the upper, and therefore is the morbidity of a halo indicated?
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
## 2017-09-19 NOTE — Progress Notes (Signed)
SLP Cancellation Note  Patient Details Name: Dustin Marquez MRN: [id number] DOB: 08/05/1981   Cancelled treatment:        Pt having ORIF of RLE today. Will continue efforts tomorrow.   Royce Macadamia 09/19/2017, 9:43 AM   Breck Coons Lonell Face.Ed [employer] [phone]

## 2017-09-19 NOTE — Progress Notes (Signed)
Orthopedic Tech Progress Note Patient Details:  Dustin Marquez 1981-11-20 [id number]  Ortho Devices Type of Ortho Device: CAM walker Ortho Device/Splint Location: rle Ortho Device/Splint Interventions: Ordered, Application, Adjustment   Saul Fordyce 09/19/2017, 12:56 PM

## 2017-09-19 NOTE — Anesthesia Procedure Notes (Signed)
Procedure Name: Intubation Date/Time: 09/19/2017 10:38 AM Performed by: Orvilla Fus A Pre-anesthesia Checklist: Patient identified, Emergency Drugs available, Suction available and Patient being monitored Patient Re-evaluated:Patient Re-evaluated prior to induction Oxygen Delivery Method: Circle System Utilized Preoxygenation: Pre-oxygenation with 100% oxygen Induction Type: IV induction Ventilation: Mask ventilation without difficulty Laryngoscope Size: Glidescope and 4 Grade View: Grade I Tube type: Oral Tube size: 7.5 mm Number of attempts: 1 Airway Equipment and Method: Stylet and Oral airway Placement Confirmation: ETT inserted through vocal cords under direct vision,  positive ETCO2 and breath sounds checked- equal and bilateral Secured at: 23 cm Tube secured with: Tape Dental Injury: Teeth and Oropharynx as per pre-operative assessment
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
## 2017-09-19 NOTE — Anesthesia Preprocedure Evaluation (Addendum)
Anesthesia Evaluation  Patient identified by MRN, date of birth, ID band Patient unresponsive  General Assessment Comment:Remains intubated and sedated  Reviewed: Allergy & Precautions, NPO status , Patient's Chart, lab work & pertinent test results, Unable to perform ROS - Chart review only  History of Anesthesia Complications Negative for: history of anesthetic complications  Airway Mallampati: Intubated       Dental   Pulmonary  L first rib FX with tiny occult PTX   breath sounds clear to auscultation       Cardiovascular + Peripheral Vascular Disease   Rhythm:Regular Rate:Tachycardia  Proximal right ICA acute dissection  Mid left ICA injury Proximal left vertebral artery injury   Neuro/Psych Remains intubated and sedated with C-spine fractures and Traumatic Carotid dissection  C-spine not cleared    GI/Hepatic negative GI ROS, Elevated LFTs   Endo/Other  negative endocrine ROS  Renal/GU negative Renal ROS     Musculoskeletal   Abdominal   Peds  Hematology  (+) Blood dyscrasia (Hb 9.9, plt 120k), ,   Anesthesia Other Findings   Reproductive/Obstetrics                             Anesthesia Physical  Anesthesia Plan  ASA: III  Anesthesia Plan: General   Post-op Pain Management: GA combined w/ Regional for post-op pain   Induction: Intravenous  PONV Risk Score and Plan: 2 and Ondansetron, Dexamethasone, Treatment may vary due to age or medical condition and Midazolam  Airway Management Planned: Oral ETT  Additional Equipment: Arterial line  Intra-op Plan:   Post-operative Plan: Possible Post-op intubation/ventilation  Informed Consent: I have reviewed the patients History and Physical, chart, labs and discussed the procedure including the risks, benefits and alternatives for the proposed anesthesia with the patient or authorized representative who has indicated his/her  understanding and acceptance.   Dental advisory given  Plan Discussed with: CRNA and Surgeon  Anesthesia Plan Comments: (Discussed block with Dr. Everardo Pacific. He did not believe this patient is a good candidate for regional anesthesia. I said that I would assess him in PACU for post op block if needed.)       Anesthesia Quick Evaluation

## 2017-09-19 NOTE — Progress Notes (Signed)
Physical medicine rehabilitation consult requested chart reviewed.patient with ORIF this afternoon external fixator removed requested for consult to be completed in the morning. Will follow up with appropriate formal consult in a.m. 09/20/2017

## 2017-09-19 NOTE — Transfer of Care (Signed)
Immediate Anesthesia Transfer of Care Note  Patient: Dustin Marquez  Procedure(s) Performed: Procedure(s): OPEN REDUCTION INTERNAL FIXATION (ORIF) TIBIA FRACTURE (Right)  Patient Location: PACU  Anesthesia Type:General  Level of Consciousness: drowsy and patient cooperative  Airway & Oxygen Therapy: Patient Spontanous Breathing and Patient connected to nasal cannula oxygen  Post-op Assessment: Report given to RN, Post -op Vital signs reviewed and stable and C-collar in place  Post vital signs: Reviewed and stable  Last Vitals:  Vitals:   09/19/17 1300 09/19/17 1315  BP: 123/70 126/76  Pulse: 66 (!) 111  Resp: (!) 24 17  Temp:    SpO2: 100% 99%    Last Pain:  Vitals:   09/19/17 1315  TempSrc:   PainSc: (P) 8          Complications: No apparent anesthesia complications stroke residual weakness remains unchanged.

## 2017-09-20 ENCOUNTER — Encounter (HOSPITAL_COMMUNITY): Payer: Self-pay

## 2017-09-20 DIAGNOSIS — I63411 Cerebral infarction due to embolism of right middle cerebral artery: Secondary | ICD-10-CM

## 2017-09-20 DIAGNOSIS — G8194 Hemiplegia, unspecified affecting left nondominant side: Secondary | ICD-10-CM

## 2017-09-20 LAB — GLUCOSE, CAPILLARY
GLUCOSE-CAPILLARY: 97 mg/dL (ref 65–99)
GLUCOSE-CAPILLARY: 101 mg/dL — AB (ref 65–99)
GLUCOSE-CAPILLARY: 91 mg/dL (ref 65–99)

## 2017-09-20 MED ORDER — LORAZEPAM 2 MG/ML IJ SOLN
1.0000 mg | Freq: Four times a day (QID) | INTRAMUSCULAR | Status: DC | PRN
  Administered 2017-09-20 – 2017-09-23 (×11): 2 mg via INTRAVENOUS
  Filled 2017-09-20 (×12): qty 1
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
## 2017-09-20 NOTE — Progress Notes (Signed)
SLP Cancellation Note  Patient Details Name: SINAN TUCH MRN: [id number] DOB: Jun 28, 1981   Cancelled treatment:       Reason Eval/Treat Not Completed: Patient's level of consciousness. Had planned to reassess swallow to determine readiness for advancement from dysphagia 1, honey-thick liquids, but pt could not be aroused for therapy.     Blenda Mounts Laurice 09/20/2017, 2:18 PM

## 2017-09-20 NOTE — Anesthesia Postprocedure Evaluation (Signed)
Anesthesia Post Note  Patient: Dustin Marquez  Procedure(s) Performed: Procedure(s) (LRB): OPEN REDUCTION INTERNAL FIXATION (ORIF) TIBIA FRACTURE (Right)     Patient location during evaluation: PACU Anesthesia Type: General Level of consciousness: sedated and patient cooperative Pain management: pain level controlled Vital Signs Assessment: post-procedure vital signs reviewed and stable Respiratory status: spontaneous breathing Cardiovascular status: stable Anesthetic complications: no    Last Vitals:  Vitals:   09/20/17 0407 09/20/17 0815  BP: 130/87 120/82  Pulse:  83  Resp: 19 17  Temp: 37.2 C   SpO2:  100%    Last Pain:  Vitals:   09/20/17 0843  TempSrc:   PainSc: Asleep    LLE Motor Response: Purposeful movement (09/20/17 0843)   RLE Motor Response: Purposeful movement (09/20/17 0843) RLE Sensation: Full sensation (09/20/17 0843)      Lewie Loron

## 2017-09-20 NOTE — Progress Notes (Signed)
[facility] Progress Note  1 Day Post-Op  Subjective: CC: agitation Patient with some agitation and impulsivity overnight. Got ativan, seems sleepy this morning. Oriented, but not following commands well.  Objective: Vital signs in last 24 hours: Temp:  [97.5 F (36.4 C)-99.1 F (37.3 C)] 99 F (37.2 C) (09/28 0407) Pulse Rate:  [66-120] 83 (09/28 0815) Resp:  [17-25] 17 (09/28 0815) BP: (114-139)/(64-88) 120/82 (09/28 0815) SpO2:  [82 %-100 %] 100 % (09/28 0815) Last BM Date: 09/17/17  Intake/Output from previous day: 09/27 0701 - 09/28 0700 In: 1703.8 [I.V.:1528.8; IV Piggyback:100] Out: 1915 [Urine:1890; Blood:25] Intake/Output this shift: Total I/O In: 240 [P.O.:240] Out: 1225 [Urine:1225]  PE: Gen: Alert, NAD, pleasant HEENT: pupils equal and round, cervical collar Card: Regular rate and rhythm, pedal pulses 2+ BL Pulm: Normal effort, clear to auscultation bilaterally Abd: Soft, non-tender, non-distended, bowel sounds present, no HSM Ext: boot present on RLE with VAC, right toes WWP with good cap refill; grip 5/5 bilaterally. Neuro: speech clear, not following commands, seems agitated Skin: warm and dry, no rashes  Psych: A&Ox3   Lab Results:   Recent Labs  09/19/17 0339  WBC 17.8*  HGB 10.0*  HCT 30.7*  PLT 564*   BMET  Recent Labs  09/18/17 0508 09/19/17 0339  NA 137 135  K 3.7 3.8  CL 104 103  CO2 25 25  GLUCOSE 105* 96  BUN 13 9  CREATININE 0.76 0.75  CALCIUM 8.2* 8.7*   PT/INR No results for input(s): LABPROT, INR in the last 72 hours. CMP     Component Value Date/Time   NA 135 09/19/2017 0339   K 3.8 09/19/2017 0339   CL 103 09/19/2017 0339   CO2 25 09/19/2017 0339   GLUCOSE 96 09/19/2017 0339   BUN 9 09/19/2017 0339   CREATININE 0.75 09/19/2017 0339   CALCIUM 8.7 (L) 09/19/2017 0339   PROT 5.7 (L) 09/09/2017 0143   ALBUMIN 2.7 (L) 09/09/2017 0143   AST 86 (H) 09/09/2017 0143   ALT 115 (H) 09/09/2017 0143   ALKPHOS 36 (L) 09/09/2017 0143   BILITOT 1.5 (H) 09/09/2017 0143   GFRNONAA >60 09/19/2017 0339   GFRAA >60 09/19/2017 0339   Lipase  No results found for: LIPASE     Studies/Results: Dg Ankle Complete Right  Result Date: 09/19/2017 CLINICAL DATA:  Open reduction internal fixation for distal tibia fracture EXAM: DG C-ARM 61-120 MIN; RIGHT ANKLE - COMPLETE 3+ VIEW COMPARISON:  CT right lower extremity September 07, 2017 FLUOROSCOPY TIME:  1 minutes 28 seconds; 6 acquired images FINDINGS: Frontal, oblique, and lateral views obtained. There is screw and plate fixation through a comminuted fracture of the distal tibia with alignment essentially anatomic. No other fracture in this region. Ankle mortise appears intact. No appreciable joint effusion. IMPRESSION: Screw and plate fixation for comminuted fracture distal tibia with alignment essentially anatomic after screw and plate fixation. No new fracture. Ankle mortise appears intact. Electronically Signed   By: Bretta Bang III M.D.   On: 09/19/2017 15:05   Dg C-arm 61-120 Min  Result Date: 09/19/2017 CLINICAL DATA:  Open reduction internal fixation for distal tibia fracture EXAM: DG C-ARM 61-120 MIN; RIGHT ANKLE - COMPLETE 3+ VIEW COMPARISON:  CT right lower extremity September 07, 2017 FLUOROSCOPY TIME:  1 minutes 28 seconds; 6 acquired images FINDINGS: Frontal, oblique, and lateral views obtained. There is screw and plate fixation through a comminuted fracture of the distal tibia with alignment essentially anatomic. No other  fracture in this region. Ankle mortise appears intact. No appreciable joint effusion. IMPRESSION: Screw and plate fixation for comminuted fracture distal tibia with alignment essentially anatomic after screw and plate fixation. No new fracture. Ankle mortise appears intact. Electronically Signed   By: Bretta Bang III M.D.   On: 09/19/2017 15:05    Anti-infectives: Anti-infectives    Start     Dose/Rate Route  Frequency Ordered Stop   09/19/17 1430  ceFAZolin (ANCEF) IVPB 2g/100 mL premix     2 g 200 mL/hr over 30 Minutes Intravenous Every 8 hours 09/19/17 1428 09/20/17 0548   09/19/17 1229  vancomycin (VANCOCIN) powder  Status:  Discontinued       As needed 09/19/17 1229 09/19/17 1239   09/19/17 1000  ceFAZolin (ANCEF) IVPB 2 g/50 mL premix     2 g 100 mL/hr over 30 Minutes Intravenous  Once 09/19/17 0947 09/19/17 1026   09/19/17 0946  ceFAZolin (ANCEF) 2-4 GM/100ML-% IVPB    Comments:  [name], [name]   : cabinet override      09/19/17 0946 09/19/17 2159   09/09/17 2100  ceFAZolin (ANCEF) IVPB 1 g/50 mL premix     1 g 100 mL/hr over 30 Minutes Intravenous Every 8 hours 09/09/17 1548 09/10/17 0614       Assessment/Plan PHBC 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 Yetta Barre Blunt cerebrovascular injury including grade 4 right internal carotid dissection, grade 3 left internal carotid injury with 3 mm pseudoaneurysm and grade 1 left vertebral artery injury at the level of the fracture- has progressed to large R MCA infarct and possible L brain infarct vs cord injury associated with C6 FX. Dual antiplatelet TX per Stroke Service.  L first rib FX with tiny occult PTX- PTX resolved, pain control, IS  Forehead and scalp lacs- staples removed yesterday R elbow abrasions- local wound care R tib fib FX- s/p ORIF 9/27 Dr. Everardo Pacific - NWB RLE - VAC and wound care per ortho  FEN- fulls, advance as tolerated VTE- ASA 325 mg, plavix 75 mg; SCDs ID- Ancef (9/17>9/18)  Dispo- Therapies today. CIR consulted.   LOS: 14 days    Wells Guiles , [facility] 09/20/2017, 9:41 AM Pager: [phone] Trauma Pager: [phone] Mon-Fri 7:00 am-4:30 pm Sat-Sun 7:00 am-11:30 am

## 2017-09-20 NOTE — Progress Notes (Signed)
Patient being demanding and impulsive. Patient wanting to get out of bed. Explained to patient that he could not get out off bed right now. Grabbed this RN's arm and pushed it away and told her to let go. Patient standing up and putting pressure on leg that he had surgery on. Called security to help get patient back in the bed. Once the patient was told that he was going to be restrained, patient got back into the bed. This RN told the patient that he could not get out of bed for his safety. If he tried again or he grabbed this RN's arm again that security would be called and that he would be restrained. Patient acknowledge his understanding. Order for ativan. 2 mg ativan given, patient resting. Will continue to monitor

## 2017-09-21 LAB — GLUCOSE, CAPILLARY
GLUCOSE-CAPILLARY: 125 mg/dL — AB (ref 65–99)
GLUCOSE-CAPILLARY: 104 mg/dL — AB (ref 65–99)
GLUCOSE-CAPILLARY: 99 mg/dL (ref 65–99)
Glucose-Capillary: 111 mg/dL — ABNORMAL HIGH (ref 65–99)

## 2017-09-21 LAB — CBC
HCT: 32.4 % — ABNORMAL LOW (ref 39.0–52.0)
Hemoglobin: 10.5 g/dL — ABNORMAL LOW (ref 13.0–17.0)
MCH: 29.1 pg (ref 26.0–34.0)
MCHC: 32.4 g/dL (ref 30.0–36.0)
MCV: 89.8 fL (ref 78.0–100.0)
PLATELETS: 709 10*3/uL — AB (ref 150–400)
RBC: 3.61 MIL/uL — ABNORMAL LOW (ref 4.22–5.81)
RDW: 14.9 % (ref 11.5–15.5)
WBC: 14.7 10*3/uL — ABNORMAL HIGH (ref 4.0–10.5)

## 2017-09-21 LAB — BASIC METABOLIC PANEL
Anion gap: 9 (ref 5–15)
BUN: 9 mg/dL (ref 6–20)
CALCIUM: 8.7 mg/dL — AB (ref 8.9–10.3)
CO2: 25 mmol/L (ref 22–32)
CREATININE: 0.8 mg/dL (ref 0.61–1.24)
Chloride: 101 mmol/L (ref 101–111)
Glucose, Bld: 106 mg/dL — ABNORMAL HIGH (ref 65–99)
Potassium: 3.8 mmol/L (ref 3.5–5.1)
SODIUM: 135 mmol/L (ref 135–145)

## 2017-09-21 NOTE — Progress Notes (Signed)
ORTHOPAEDIC PROGRESS NOTE  s/p Procedure(s): EXTERNAL FIXATION RIGHT LOWER LEG and ORIF w ex fix removal 9/27  SUBJECTIVE: Resting, following commands loosely but minimally non-communicative.  OBJECTIVE: PE:RLE - knee with mild effusion.  boot in place  patient has inconsisent ability to respond to sensory function testing but does fire FHL, and weakly TA function noted but incomplete assessment. prevena in place  Vitals:   09/20/17 1950 09/21/17 0419  BP: 105/73 122/84  Pulse: 98   Resp: 20   Temp: 99.7 F (37.6 C) 98.3 F (36.8 C)  SpO2: 99%      ASSESSMENT: Dustin Marquez is a 36 y.o. male with right tibia fracture sp Ex fix and ex fix removal and ORIF of tibia 9/27 and Right laterally based ligamentous injury of knee.    PLAN: -NWB RLE in boot -prevena in place until monday -Elevate operative extremity as tolerated to minimize swelling - plan for 2-3 week fu for suture removal
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
## 2017-09-21 NOTE — Progress Notes (Signed)
Neurosurgery Progress Note  Apparently was agitated prior to my arrival and just given meds Sedated and unable to obtain history or perform exam C collar in place. Maintain collar. Continue with conservative treatment.

## 2017-09-22 LAB — GLUCOSE, CAPILLARY
GLUCOSE-CAPILLARY: 101 mg/dL — AB (ref 65–99)
GLUCOSE-CAPILLARY: 102 mg/dL — AB (ref 65–99)
GLUCOSE-CAPILLARY: 107 mg/dL — AB (ref 65–99)
GLUCOSE-CAPILLARY: 113 mg/dL — AB (ref 65–99)
Glucose-Capillary: 126 mg/dL — ABNORMAL HIGH (ref 65–99)

## 2017-09-22 MED ORDER — RESOURCE THICKENUP CLEAR PO POWD
ORAL | Status: DC | PRN
  Filled 2017-09-22 (×2): qty 125

## 2017-09-22 MED ORDER — RESOURCE THICKENUP CLEAR PO POWD: Freq: Once | ORAL | Status: DC

## 2017-09-22 NOTE — Progress Notes (Signed)
Mr Bok in room 4e18 pulled out his PICC out, tip    Intact , doctor inforned

## 2017-09-22 NOTE — Progress Notes (Signed)
Neurosurgery Progress Note  No issues overnight.   EXAM:  BP (!) 131/92 (BP Location: Right Arm)   Pulse (!) 119   Temp 97.6 F (36.4 C) (Oral)   Resp 19   Ht  (1.778 m)   Wt 68.9 kg (152 lb)   SpO2 97%   BMI 21.81 kg/m   Sedated. Wiggles RUE fingers on command. Will move bilateral feet on command. Does not move LUE on command, but with painful stimuli, will move LUE and Rue antigravity and sit self up in bed more.  IMPRESSION/PLAN C collar in place. Maintain collar. Continue with conservative treatment.

## 2017-09-22 NOTE — Progress Notes (Signed)
3 Days Post-Op   Subjective/Chief Complaint: NAE   Objective: Vital signs in last 24 hours: Temp:  [97.6 F (36.4 C)-98.3 F (36.8 C)] 97.6 F (36.4 C) (09/30 0341) Pulse Rate:  [91-125] 119 (09/30 0341) Resp:  [19-22] 19 (09/30 0341) BP: (120-131)/(80-92) 131/92 (09/30 0341) SpO2:  [97 %-100 %] 97 % (09/30 0341) Last BM Date: 09/17/17  Intake/Output from previous day: 09/29 0701 - 09/30 0700 In: 5501.7 [P.O.:720; I.V.:4781.7] Out: 2550 [Urine:2550] Intake/Output this shift: No intake/output data recorded.  General appearance: sedated Cardio: regular rate and rhythm, S1, S2 normal, no murmur, click, rub or gallop GI: soft, non-tender; bowel sounds normal; no masses,  no organomegaly Neurologic: Grossly normal  Lab Results:   Recent Labs  09/21/17 0351  WBC 14.7*  HGB 10.5*  HCT 32.4*  PLT 709*   BMET  Recent Labs  09/21/17 0351  NA 135  K 3.8  CL 101  CO2 25  GLUCOSE 106*  BUN 9  CREATININE 0.80  CALCIUM 8.7*   PT/INR No results for input(s): LABPROT, INR in the last 72 hours. ABG No results for input(s): PHART, HCO3 in the last 72 hours.  Invalid input(s): PCO2, PO2  Studies/Results: No results found.  Anti-infectives: Anti-infectives    Start     Dose/Rate Route Frequency Ordered Stop   09/19/17 1430  ceFAZolin (ANCEF) IVPB 2g/100 mL premix     2 g 200 mL/hr over 30 Minutes Intravenous Every 8 hours 09/19/17 1428 09/20/17 0548   09/19/17 1229  vancomycin (VANCOCIN) powder  Status:  Discontinued       As needed 09/19/17 1229 09/19/17 1239   09/19/17 1000  ceFAZolin (ANCEF) IVPB 2 g/50 mL premix     2 g 100 mL/hr over 30 Minutes Intravenous  Once 09/19/17 0947 09/19/17 1026   09/19/17 0946  ceFAZolin (ANCEF) 2-4 GM/100ML-% IVPB    Comments:  [name], [name]   : cabinet override      09/19/17 0946 09/19/17 2159   09/09/17 2100  ceFAZolin (ANCEF) IVPB 1 g/50 mL premix     1 g 100 mL/hr over 30 Minutes Intravenous Every 8 hours 09/09/17 1548  09/10/17 4098      Assessment/Plan: s/p Procedure(s): OPEN REDUCTION INTERNAL FIXATION (ORIF) TIBIA FRACTURE (Right) PHBC 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 Yetta Barre Blunt cerebrovascular injury including grade 4 right internal carotid dissection, grade 3 left internal carotid injury with 3 mm pseudoaneurysm and grade 1 left vertebral artery injury at the level of the fracture- has progressed to large R MCA infarct and possible L brain infarct vs cord injury associated with C6 FX. Dual antiplatelet TX per Stroke Service.  L first rib FX with tiny occult PTX- PTX resolved, pain control, IS   R elbow abrasions- local wound care R tib fib FX- s/p ORIF 9/27 Dr. Everardo Pacific - NWB RLE - VAC and wound care per ortho  FEN- fulls, advance as tolerated VTE- ASA 325 mg, plavix 75 mg; SCDs ID- Ancef (9/17>9/18) Sedation as needed. Dispo- eventual CIR   LOS: 16 days    Dustin Marquez., Jed Limerick 09/22/2017

## 2017-09-22 NOTE — Plan of Care (Signed)
Problem: Education: Goal: Knowledge of disease or condition will improve Outcome: Not Progressing Pt requires frequent redirection and encouragement from staff to remain in bed and free from falls. Pt has tele sitter in room, bed alarm is set, and floor mats in place. Pt encouraged to use call bell for staff assistance.

## 2017-09-23 ENCOUNTER — Inpatient Hospital Stay (HOSPITAL_COMMUNITY): Payer: Medicaid Other

## 2017-09-23 MED ORDER — BETHANECHOL CHLORIDE 10 MG PO TABS
10.0000 mg | ORAL_TABLET | Freq: Three times a day (TID) | ORAL | Status: DC
  Administered 2017-09-23 – 2017-09-25 (×7): 10 mg via ORAL
  Filled 2017-09-23 (×8): qty 1

## 2017-09-23 MED ORDER — ASPIRIN 325 MG PO TABS
325.0000 mg | ORAL_TABLET | Freq: Every day | ORAL | Status: DC
  Administered 2017-09-24 – 2017-10-09 (×15): 325 mg via ORAL
  Filled 2017-09-23 (×15): qty 1

## 2017-09-23 MED ORDER — QUETIAPINE FUMARATE 25 MG PO TABS
50.0000 mg | ORAL_TABLET | Freq: Three times a day (TID) | ORAL | Status: DC
  Administered 2017-09-23 – 2017-09-24 (×3): 50 mg via ORAL
  Filled 2017-09-23 (×3): qty 2

## 2017-09-23 MED ORDER — DOCUSATE SODIUM 100 MG PO CAPS
100.0000 mg | ORAL_CAPSULE | Freq: Two times a day (BID) | ORAL | Status: DC | PRN
  Administered 2017-09-26 – 2017-10-21 (×4): 100 mg via ORAL
  Filled 2017-09-23 (×5): qty 1

## 2017-09-23 MED ORDER — LORAZEPAM 2 MG/ML IJ SOLN
1.0000 mg | INTRAMUSCULAR | Status: DC | PRN
  Administered 2017-09-23 – 2017-09-27 (×5): 2 mg via INTRAVENOUS
  Administered 2017-09-28: 1 mg via INTRAVENOUS
  Administered 2017-09-29: 2 mg via INTRAVENOUS
  Administered 2017-09-29: 1 mg via INTRAVENOUS
  Administered 2017-10-01: 2 mg via INTRAVENOUS
  Filled 2017-09-23 (×10): qty 1

## 2017-09-23 MED ORDER — ACETAMINOPHEN 160 MG/5ML PO SOLN
650.0000 mg | ORAL | Status: DC | PRN
  Administered 2017-09-26 – 2017-10-09 (×19): 650 mg via ORAL
  Filled 2017-09-23 (×21): qty 20.3

## 2017-09-23 MED ORDER — QUETIAPINE FUMARATE 25 MG PO TABS: 25.0000 mg | ORAL_TABLET | Freq: Three times a day (TID) | ORAL | Status: DC | PRN

## 2017-09-23 NOTE — Progress Notes (Signed)
Spoke with PA. Order from 9/26 to remove forehead and scalp staples. Staple still remaining on R side of head. Verbal order from trauma PA to remove. Pt tolerated well. Site clean, dry, intact, no drainage.   Leonidas Romberg, RN
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
## 2017-09-23 NOTE — Progress Notes (Signed)
Pt agitated this am. Swinging legs out of bed. PRN ativan given. Pt able to be redirected. Aromatherapy Peace blend placed on cotton ball in room. Safety sitter at bedside.   Leonidas Romberg, RN

## 2017-09-23 NOTE — Progress Notes (Signed)
ORTHOPAEDIC PROGRESS NOTE  s/p Procedure(s): EXTERNAL FIXATION RIGHT LOWER LEG and ORIF w ex fix removal 9/27  SUBJECTIVE: Agitated, combative this morning during exam.  OBJECTIVE: PE:RLE - knee with mild effusion.  boot in place  Incisions clean dry and intact, wwp foot, unable to participate in voluntary motor and sensory function testing due to agitation  Vitals:   09/23/17 0000 09/23/17 0400  BP: (!) 135/91 102/66  Pulse:    Resp: 20 12  Temp: 98 F (36.7 C) 98.2 F (36.8 C)  SpO2: 98%      ASSESSMENT: Dustin Marquez is a 36 y.o. male with right tibia fracture sp Ex fix and ex fix removal and ORIF of tibia 9/27 and Right laterally based ligamentous injury of knee.    PLAN: -TDWB RLE in boot and ordered hinged knee brace unlocked today to accomodate boot as well.   -Daily RN dressing changes addressing medial incision most carefully. -Elevate operative extremity as tolerated to minimize swelling - plan for 2-3 week follow up for suture removal

## 2017-09-23 NOTE — Progress Notes (Signed)
  Speech Language Pathology  Patient Details Name: Dustin Marquez MRN: [id number] DOB: 1981-06-14 Today's Date: 09/23/2017 Time:  -       Spoke with RN who states pt is awake, after receiving Ativan this morning. MBS scheduled for 10:30 for possible upgrade in solids and liquids.    Breck Coons [place].Ed [employer] [phone]

## 2017-09-23 NOTE — Progress Notes (Signed)
Posey Veil Bed initiated per order. Pt IV saline locked, wrapped in gauze. Neck collar in place. RLE boot and knee brace in place. Condom catheter in place - tubing through tubing slit in bed. Call bell placed in bed with patient. Bed in lowest position. Restraint flowsheet documented. Door open, patient resting comfortably.  Leonidas Romberg, RN

## 2017-09-23 NOTE — Progress Notes (Signed)
Modified Barium Swallow Progress Note  Patient Details  Name: Dustin Marquez MRN: [id number] Date of Birth: 07/13/1981  Today's Date: 09/23/2017  Modified Barium Swallow completed.  Full report located under Chart Review in the Imaging Section.  Brief recommendations include the following:  Clinical Impression  Swallow integrity appears similar to FEES 9/25. Difficult consistent view of laryngeal/pharyngeal structures due to pt frequent moving in and out of view (TBI). Silent laryngeal penetration with nectar to the vocal cords during second swallow of lingual residue due ot decreased timing of initiation of hyoid excursion/laryngeal elevation. After contacting cords, barium ascended to the vestibule wall but did not clear. Decreased oral cohesion, anterior sulci residue and anterior spill. Given obsevations and pt's impulsivity, recommend continue honey thick liquid. Upgrade texture to Dys 2, crush meds and full supervision and assist.          Swallow Evaluation Recommendations       SLP Diet Recommendations: Dysphagia 2 (Fine chop) solids;Honey thick liquids   Liquid Administration via: Cup;No straw   Medication Administration: Crushed with puree   Supervision: Staff to assist with self feeding;Full supervision/cueing for compensatory strategies   Compensations: Minimize environmental distractions;Slow rate;Small sips/bites;Lingual sweep for clearance of pocketing   Postural Changes: Seated upright at 90 degrees   Oral Care Recommendations: Oral care BID        Royce Macadamia 09/23/2017,12:28 PM  Breck Coons [place].Ed [employer] [phone]
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
## 2017-09-23 NOTE — Progress Notes (Signed)
Orthopedic Tech Progress Note Patient Details:  Dustin Marquez 08-28-1981 [id number]  Patient ID: Dustin Marquez, male   DOB: 1981/11/09, 36 y.o.   MRN: [id number]   Dustin Marquez 09/23/2017, 12:28 PMCalled [employer] for right hinged knee brace, unlocked and right boot.
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
## 2017-09-23 NOTE — Progress Notes (Signed)
Patient ID: Dustin Marquez, male   DOB: 03-Jan-1981, 36 y.o.   MRN: [id number] Pt lethargic but moving around in bed, moving all ext. In collar. Again, the collar is the best we can do for now. I do not feel a halo is in his best interest (not sure given his constant movement he'd tolerate it nor do I believe it'll offer significantly better immobilization of the lower cervical spine), and I cannot fixate him while on plavix/ ASA and the risk of CVA off of these has proven to be high.

## 2017-09-24 ENCOUNTER — Encounter (HOSPITAL_COMMUNITY): Payer: Self-pay | Admitting: Orthopaedic Surgery

## 2017-09-24 DIAGNOSIS — F17210 Nicotine dependence, cigarettes, uncomplicated: Secondary | ICD-10-CM

## 2017-09-24 DIAGNOSIS — G4700 Insomnia, unspecified: Secondary | ICD-10-CM

## 2017-09-24 DIAGNOSIS — S82871A Displaced pilon fracture of right tibia, initial encounter for closed fracture: Secondary | ICD-10-CM

## 2017-09-24 DIAGNOSIS — H53462 Homonymous bilateral field defects, left side: Secondary | ICD-10-CM

## 2017-09-24 DIAGNOSIS — R451 Restlessness and agitation: Secondary | ICD-10-CM

## 2017-09-24 DIAGNOSIS — S12500A Unspecified displaced fracture of sixth cervical vertebra, initial encounter for closed fracture: Secondary | ICD-10-CM

## 2017-09-24 DIAGNOSIS — I7771 Dissection of carotid artery: Secondary | ICD-10-CM

## 2017-09-24 DIAGNOSIS — G936 Cerebral edema: Secondary | ICD-10-CM

## 2017-09-24 DIAGNOSIS — F3130 Bipolar disorder, current episode depressed, mild or moderate severity, unspecified: Secondary | ICD-10-CM

## 2017-09-24 DIAGNOSIS — M542 Cervicalgia: Secondary | ICD-10-CM

## 2017-09-24 MED ORDER — ENSURE ENLIVE PO LIQD
237.0000 mL | Freq: Two times a day (BID) | ORAL | Status: DC
  Administered 2017-09-24 – 2017-09-26 (×4): 237 mL via ORAL

## 2017-09-24 MED ORDER — ENSURE ENLIVE PO LIQD: 237.0000 mL | Freq: Two times a day (BID) | ORAL | Status: DC

## 2017-09-24 MED ORDER — QUETIAPINE FUMARATE 50 MG PO TABS
100.0000 mg | ORAL_TABLET | Freq: Three times a day (TID) | ORAL | Status: DC
  Administered 2017-09-24 – 2017-10-11 (×49): 100 mg via ORAL
  Filled 2017-09-24 (×11): qty 2
  Filled 2017-09-24: qty 1
  Filled 2017-09-24 (×2): qty 2
  Filled 2017-09-24: qty 1
  Filled 2017-09-24 (×2): qty 2
  Filled 2017-09-24: qty 1
  Filled 2017-09-24 (×8): qty 2
  Filled 2017-09-24: qty 1
  Filled 2017-09-24 (×22): qty 2

## 2017-09-24 MED ORDER — DIVALPROEX SODIUM ER 500 MG PO TB24
500.0000 mg | ORAL_TABLET | Freq: Every day | ORAL | Status: DC
  Administered 2017-09-24 – 2017-09-29 (×6): 500 mg via ORAL
  Filled 2017-09-24 (×7): qty 1
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
## 2017-09-24 NOTE — Progress Notes (Signed)
At shift change pt was in stool from head to toe. Pt had to have neck brace removed and new one place. Pt must have new leg brace ordered. Pt is alert to person. Pt showered and is in new bed at this time.

## 2017-09-25 ENCOUNTER — Inpatient Hospital Stay (HOSPITAL_COMMUNITY): Payer: Medicaid Other

## 2017-09-25 MED ORDER — BETHANECHOL CHLORIDE 10 MG PO TABS
5.0000 mg | ORAL_TABLET | Freq: Three times a day (TID) | ORAL | Status: DC
  Administered 2017-09-25 – 2017-10-09 (×42): 5 mg via ORAL
  Filled 2017-09-25 (×42): qty 1

## 2017-09-25 NOTE — Progress Notes (Addendum)
Patient transferred to 5MR1 along with his personal belongings, CCMD notified.

## 2017-09-25 NOTE — Progress Notes (Signed)
Report called to nurse on 5M 
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
## 2017-09-25 NOTE — Progress Notes (Signed)
Patient ID: Dustin Marquez, male   DOB: Jul 30, 1981, 36 y.o.   MRN: [id number] Will check f/u lateral c-spine xray today.

## 2017-09-25 NOTE — Progress Notes (Signed)
Appreciate reconsideration by the Rehab team.   Dustin Marquez. Gae Bon, MD, FACS [phone] (pager) [phone] (direct pager) Trauma Surgeon
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
## 2017-09-25 NOTE — Progress Notes (Signed)
Rehab admissions - I spoke with Dr. Lindie Spruce this am.  Noted patient is in enclosure bed.  I have discussed patient progress with rehab medical director and with rehab unit director.  I am awaiting decision from rehab team regarding a potential acute inpatient rehab admission.  I will follow up again tomorrow am.  Call me for questions.  #[phone]

## 2017-09-25 NOTE — Progress Notes (Signed)
  Speech Language Pathology Treatment: Dysphagia  Patient Details Name: Dustin Marquez MRN: [id number] DOB: 04/09/1981 Today's Date: 09/25/2017 Time: 1313-1356 SLP Time Calculation (min) (ACUTE ONLY): 43 min  [facility]5ShSantiago GladeinRecommendation Clinical Impression  [id number] e and tiny occult PTX, forehead and scalp lacerations, right elbow abrasion and  right tib fib fx. Initial head CT 9/14 without intracranial abnormality. MRI 9/18: Cortical ischemia throughout the right MCA distribution, in addition to areas of ischemia within the right hemispheric deep watershed zone and at the right MCA/PCA watershed, No intracranial hemorrhage. Minimal mass effect with 2 mm leftward midline shift. 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 MBS for possible diet/liquids upgrade.       SLP Plan  Continue with current plan of care       Recommendations  Diet recommendations: Dysphagia 2 (fine chop);Honey-thick liquid Liquids provided via: Cup;No straw Medication Administration: Crushed with puree Supervision: Staff to assist with self feeding;Patient able to self feed;Full supervision/cueing for compensatory strategies Compensations: Minimize environmental distractions;Slow rate;Small sips/bites;Lingual sweep for clearance of pocketing (Tactile cues to slow rate) Postural Changes and/or Swallow Maneuvers: Seated upright 90 degrees                Oral Care Recommendations: Oral care BID Follow up Recommendations: Inpatient Rehab SLP Visit Diagnosis: Dysphagia, oropharyngeal phase (R13.12) Plan: Continue with current plan of care       GO                Noela Brothers Willis 09/25/2017, 2:08 PM   Naziah Weckerly Willis Amerie Beaumont M.Ed CCC-SLP Pager [phone]

## 2017-09-25 NOTE — Progress Notes (Signed)
Hinge brace that was completely covered in stool is an off campus order I have washed and cleaned brace and let it dry. Page ortho tech to assist with back together.
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
## 2017-09-25 NOTE — Progress Notes (Signed)
Orthopedic Tech Progress Note Patient Details:  Dustin Marquez 1981/03/30 [id number]  Patient ID: [name], male   DOB: 1981/03/12, 36 y.o.   MRN: [id number] Assisted RN with hinged knee brace reapplication.  [name] 09/25/2017, 7:06 AM

## 2017-09-25 NOTE — Progress Notes (Addendum)
Right boot got soiled, washed by this RN this morning and put away to let it dry,MD notified, he said its ok to leave boot off for now, wound care performed right hinged knee brace in place will continue to monitor.

## 2017-09-25 NOTE — Progress Notes (Signed)
Patient transferred from 4E at this time. Patient currently in Posey Bed. VSS. No other distress noted. Will continue to monitor.   Sim Boast, RN

## 2017-09-26 LAB — URINALYSIS, ROUTINE W REFLEX MICROSCOPIC
Bilirubin Urine: NEGATIVE
Glucose, UA: NEGATIVE mg/dL
HGB URINE DIPSTICK: NEGATIVE
Ketones, ur: 5 mg/dL — AB
NITRITE: POSITIVE — AB
PH: 9 — AB (ref 5.0–8.0)
Protein, ur: 100 mg/dL — AB
SPECIFIC GRAVITY, URINE: 1.026 (ref 1.005–1.030)

## 2017-09-26 MED ORDER — ENSURE ENLIVE PO LIQD
237.0000 mL | Freq: Three times a day (TID) | ORAL | Status: DC
  Administered 2017-09-26 – 2017-11-12 (×113): 237 mL via ORAL

## 2017-09-26 NOTE — Progress Notes (Signed)
Patient very anxious through out the night, not responding to medications, IV possible infiltrated will start new IV this AM.

## 2017-09-26 NOTE — Progress Notes (Signed)
Orthopedic Tech Progress Note Patient Details:  Dustin Marquez [birth year] [id number]  Ortho Devices Type of Ortho Device: CAM walker Ortho Device/Splint Location: rle Ortho Device/Splint Interventions: Application   Dustin Marquez 09/26/2017, 9:50 AM
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
## 2017-09-26 NOTE — Progress Notes (Signed)
Rehab admissions - A discussion took place between rehab Water quality scientist, Ship broker and care Facilities manager.  The decision is that the patient needs SNF placement.  I have let the social worker know of decision.  Call me for questions.  #[phone]

## 2017-09-26 NOTE — Progress Notes (Signed)
ORTHOPAEDIC PROGRESS NOTE  s/p Procedure(s): EXTERNAL FIXATION RIGHT LOWER LEG and ORIF w ex fix removal 9/27  SUBJECTIVE: More communicative today.  In Posey bed.  OBJECTIVE: PE:RLE - knee with mild effusion.  Hinged brace in place  Incisions clean dry and intact though some lightening of skin around distal incision, minimal drainage, wwp foot, unable to participate in voluntary motor and sensory function testing due to agitation  Vitals:   09/25/17 2109 09/26/17 0500  BP: 115/81 110/78  Pulse: (!) 131 (!) 119  Resp: 18 16  Temp: 98.7 F (37.1 C) 98.7 F (37.1 C)  SpO2: 100% 99%     ASSESSMENT: Dustin Marquez is a 36 y.o. male with right tibia fracture sp Ex fix and ex fix removal and ORIF of tibia 9/27 and Right laterally based ligamentous injury of knee.    PLAN: -TDWB RLE  In boot and hinged brace unlocked. -Daily RN dressing changes addressing medial incision most carefully.  Discussed with new RN team today. -Elevate operative extremity as tolerated to minimize swelling - plan for 3 week postop follow up for suture removal

## 2017-09-26 NOTE — Progress Notes (Addendum)
Nutrition Follow-up  INTERVENTION:    Continue Magic cup TID with meals, each supplement provides 290 kcal and 9 grams of protein   Ensure Enlive po TID (thicken to appropriate consistency), each supplement provides 350 kcal and 20 grams of protein  NUTRITION DIAGNOSIS:   Increased nutrient needs related to  (trauma) as evidenced by estimated needs, ongoing  GOAL:   Patient will meet greater than or equal to 90% of their needs, progressing  MONITOR:   PO intake, Supplement acceptance, Labs, Weight trends, Skin, I & O's  ASSESSMENT:   Pt admitted as a PHBC with Concussion, C6 FX with cord injury and epidural hematoma, and Blunt cerebrovascular injury including grade 4 right internal carotid dissection, grade 3 left internal carotid injury with 3 mm pseudoaneurysm and grade 1 left vertebral artery injury at the level of the fracture, R tib/fib fx with external fix.   9/21 Cortrak tube placed 9/27 TF orders & Cortrak tube discontinued  9/27 s/p ORIF pilon fracture  Pt in Posey bed. Has on and off agitation. Diet upgraded to Dysphagia 2, honey thick liquids per SLP 10/1. Average PO intake is approximately 60% per flowsheet records.  Ensure Enlive supplement ordered 10/2. Pt drinking. Medications reviewed and include Colace & Protonix. Dispo: SNF placement.  Diet Order:  DIET DYS 2 Room service appropriate? Yes; Fluid consistency: Honey Thick  Skin:   (closed rt groin incision)  Last BM:  10/3   Intake/Output Summary (Last 24 hours) at 09/26/17 1343 Last data filed at 09/26/17 1138  Gross per 24 hour  Intake              980 ml  Output              250 ml  Net              730 ml   Height:   Ht Readings from Last 1 Encounters:  09/06/17  (1.778 m)   Weight:   Wt Readings from Last 1 Encounters:  09/23/17 126 lb 12.8 oz (57.5 kg)   Ideal Body Weight:  75.5 kg  BMI:  Body mass index is 18.19 kg/m.  Estimated Nutritional Needs:   Kcal:   2200-2400  Protein:  110-120 grams  Fluid:  > 2.2 L/day  EDUCATION NEEDS:   No education needs identified at this time  Maureen Chatters, RD, LDN Pager #: [phone] After-Hours Pager #: [phone]

## 2017-09-26 NOTE — Clinical Social Work Note (Signed)
Clinical Social Worker received referral for potential SNF placement, however patient remains in a vail bed.  Per inpatient rehab, patient is not a candidate due to lack of discharge disposition.  CSW will pursue LOG SNF placement once patient is able to be managed outside of the vail bed.  Macario Golds, [place] [phone]

## 2017-09-27 LAB — BASIC METABOLIC PANEL
ANION GAP: 8 (ref 5–15)
BUN: 24 mg/dL — ABNORMAL HIGH (ref 6–20)
CALCIUM: 9 mg/dL (ref 8.9–10.3)
CHLORIDE: 93 mmol/L — AB (ref 101–111)
CO2: 32 mmol/L (ref 22–32)
Creatinine, Ser: 1.03 mg/dL (ref 0.61–1.24)
GFR calc non Af Amer: >60 mL/min (ref >60)
Glucose, Bld: 103 mg/dL — ABNORMAL HIGH (ref 65–99)
POTASSIUM: 3.7 mmol/L (ref 3.5–5.1)
Sodium: 133 mmol/L — ABNORMAL LOW (ref 135–145)

## 2017-09-27 MED ORDER — DEXTROSE 5 % IV SOLN
1.0000 g | INTRAVENOUS | Status: DC
  Administered 2017-09-27: 1 g via INTRAVENOUS
  Filled 2017-09-27: qty 10

## 2017-09-27 MED ORDER — CIPROFLOXACIN HCL 500 MG PO TABS
500.0000 mg | ORAL_TABLET | Freq: Two times a day (BID) | ORAL | Status: DC
  Administered 2017-09-27 – 2017-09-29 (×5): 500 mg via ORAL
  Filled 2017-09-27 (×5): qty 1
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
## 2017-09-27 NOTE — Clinical Social Work Note (Signed)
Clinical Social Work Assessment  Patient Details  Name: Dustin Marquez MRN: [id number] Date of Birth: 02-Jun-1981  Date of referral:  09/27/17               Reason for consult:  Trauma, Facility Placement                Permission sought to share information with:  Family Supports Permission granted to share information::  Yes, Verbal Permission Granted  Name::     Vinay Ertl  Relationship::  Brother  Contact Information:  [phone]  Housing/Transportation Living arrangements for the past 2 months:  Homeless Source of Information:  Other (Comment Required) (Sibling) Patient Interpreter Needed:  None Criminal Activity/Legal Involvement Pertinent to Current Situation/Hospitalization:  No - Comment as needed Significant Relationships:  Siblings, Other Family Members Lives with:  Other (Comment) (Homeless) Do you feel safe going back to the place where you live?  No Need for family participation in patient care:  Yes (Comment)  Care giving concerns:  Patient brother states that due to patient previous behaviors with mental health concerns, patient family is very hesitant regarding providing housing.   Social Worker assessment / plan:  Visual merchandiser spoke with patient brother, Dustin Marquez, over the phone to offer support and discuss patient plans at discharge.  Patient brother states that patient has been homeless/transient for several years.  Per patient brother, patient has burned many bridges and due to his baseline mental health concerns, the family is fearful of allowing him back in their homes.  Patient brother is agreeable with initiating SNF search once medically appropriate (no vail bed) and verbalized understanding of possible long distance placement.  Patient brother hopeful for full recovery and states that patient family is willing to financial assist with housing on patient behalf if able to live independently again.  CSW to initiate LOG SNF search and  will follow up with bed offers once available.  CSW remains available for support and to facilitate patient discharge needs.  Employment status:  Unemployed Health and safety inspector:  Self Pay (Medicaid Pending) PT Recommendations:  Inpatient Rehab Consult, 24 Hour Supervision Information / Referral to community resources:  Skilled Nursing Facility  Patient/Family's Response to care:  Patient brother verbalized understanding of CSW role and appreciation for support and concern.  Patient brother would love for patient to remain close so family can visit but understands that bed space is limited and it could be placement outside of the area.  Patient/Family's Understanding of and Emotional Response to Diagnosis, Current Treatment, and Prognosis:  Patient brother with limited understanding of patient needs - states he has not been able to visit in almost two weeks.  Patient aunts have been in and out at bedside.  Patient brother states "bridges have burned, so this is how it has to be.  Don't mean to be cold, just the truth."  Emotional Assessment Appearance:  Appears stated age Attitude/Demeanor/Rapport:  Unable to Assess Affect (typically observed):  Unable to Assess Orientation:  Oriented to Self, Oriented to Place Alcohol / Substance use:  Not Applicable Psych involvement (Current and /or in the community):  Yes (Comment) (Patient with baseline mental health concerns)  Discharge Needs  Concerns to be addressed:  Discharge Planning Concerns, Coping/Stress Concerns, Homelessness, Mental Health Concerns Readmission within the last 30 days:  No Current discharge risk:  Physical Impairment, Cognitively Impaired Barriers to Discharge:  Continued Medical Work up, Homeless with medical needs  Macario Golds, LCSW [phone]

## 2017-09-27 NOTE — Progress Notes (Signed)
[facility] Progress Note  8 Days Post-Op  Subjective: CC:  Using the restroom. Following commands  Nurse noticed foul-smelling urine yesterday and UA was ordered.  Objective: Vital signs in last 24 hours: Temp:  [98.1 F (36.7 C)-98.3 F (36.8 C)] 98.1 F (36.7 C) (10/05 0533) Pulse Rate:  [89-133] 122 (10/05 0533) Resp:  [16-18] 18 (10/05 0533) BP: (90-133)/(59-80) 100/80 (10/05 0533) SpO2:  [94 %-100 %] 98 % (10/05 0533) Last BM Date: 09/24/17  Intake/Output from previous day: 10/04 0701 - 10/05 0700 In: 610 [P.O.:600; I.V.:10] Out: -  Intake/Output this shift: No intake/output data recorded.  PE: Gen:  Alert, NAD, cooperative Card:  Regular rate and rhythm Pulm:  Normal effort, clear to auscultation bilaterally Abd: Soft, non-tender, non-distended, bowel sounds present, no organomegaly  Skin: warm and dry, no rashes  MSK: ortho dressings in place Neuro: [facility]   CMP     Component Value Date/Time   NA 135 09/21/2017 0351   K 3.8 09/21/2017 0351   CL 101 09/21/2017 0351   CO2 25 09/21/2017 0351   GLUCOSE 106 (H) 09/21/2017 0351   BUN 9 09/21/2017 0351   CREATININE 0.80 09/21/2017 0351   CALCIUM 8.7 (L) 09/21/2017 0351   PROT 5.7 (L) 09/09/2017 0143   ALBUMIN 2.7 (L) 09/09/2017 0143   AST 86 (H) 09/09/2017 0143   ALT 115 (H) 09/09/2017 0143   ALKPHOS 36 (L) 09/09/2017 0143   BILITOT 1.5 (H) 09/09/2017 0143   GFRNONAA >60 09/21/2017 0351   GFRAA >60 09/21/2017 0351   Lipase  No results found for: LIPASE  Studies/Results: Dg Cervical Spine 1 View  Result Date: 09/25/2017 CLINICAL DATA:  History of cervical fracture. EXAM: CERVICAL SPINE 1 VIEW COMPARISON:  None. FINDINGS: Vertebral body heights are maintained. Stable 3 mm anterolisthesis of C6 on C7 with fractures of the left C6-C7 articular process. No new fracture or subluxation. IMPRESSION: Stable 3 mm anterolisthesis of C6 on C7 related to left C6-7 articular process fractures.  Electronically Signed   By: Elige Ko   On: 09/25/2017 13:22    Anti-infectives: Anti-infectives    Start     Dose/Rate Route Frequency Ordered Stop   09/19/17 1430  ceFAZolin (ANCEF) IVPB 2g/100 mL premix     2 g 200 mL/hr over 30 Minutes Intravenous Every 8 hours 09/19/17 1428 09/20/17 0548   09/19/17 1229  vancomycin (VANCOCIN) powder  Status:  Discontinued       As needed 09/19/17 1229 09/19/17 1239   09/19/17 1000  ceFAZolin (ANCEF) IVPB 2 g/50 mL premix     2 g 100 mL/hr over 30 Minutes Intravenous  Once 09/19/17 0947 09/19/17 1026   09/19/17 0946  ceFAZolin (ANCEF) 2-4 GM/100ML-% IVPB    Comments:  [name], [name]   : cabinet override      09/19/17 0946 09/19/17 2159   09/09/17 2100  ceFAZolin (ANCEF) IVPB 1 g/50 mL premix     1 g 100 mL/hr over 30 Minutes Intravenous Every 8 hours 09/09/17 1548 09/10/17 0614     Assessment/Plan PHBC 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 Dual antiplatelet TX per Stroke Service.  L first rib  FX with tiny occult PTX- PTX resolved, pain control, IS  R elbow abrasions- local wound care R tib fib FX- s/p ORIF 9/27 Dr. Everardo Pacific - TDWB RLE in boot (Dr. Everardo Pacific to order the appropriate boot) - wound care per ortho - 2-3 week f/u for suture removal  Agitation- scheduled seroquel, ativan q4h prn, vail bed Hx of Bipolar disorder - psych consulted, restarted home depakote 500 mg PO QHS, can be titrated to 1000 mg PO QHS - check Valproic acid level after 5 days for therapeutic window UTI - culture pending; UA significant for many bacteria, nitrites, and leuks - will start empiric tx w Ceftriaxone FEN- DYS 2  with honey thick  ID - Ceftriaxone 10/5 >> VTE- ASA 325 mg, plavix 75 mg; SCDs Dispo- LOG SNF once all restraints d/c-ed    LOS: 21 days    Adam Phenix , [facility] 09/27/2017, 7:37 AM Pager: [phone] Consults: [phone] Mon-Fri 7:00 am-4:30 pm Sat-Sun 7:00 am-11:30 am

## 2017-09-27 NOTE — Progress Notes (Signed)
PT Cancellation Note  Patient Details Name: ROYALTY DOMAGALA MRN: [id number] DOB: [birth year]   Cancelled Treatment:    Reason Eval/Treat Not Completed: Patient's level of consciousness. Pt unable to be aroused for therapy session. Per RN, pt just got meds. Will check back as schedule allows to continue with PT POC.    Marylynn Pearson 09/27/2017, 12:13 PM   Conni Slipper, PT, DPT Acute Rehabilitation Services Pager: [phone]

## 2017-09-28 LAB — URINE CULTURE

## 2017-09-28 NOTE — Progress Notes (Addendum)
Subjective No acute events. Tolerating diet with assist. +UA, culture pending.  Objective: Vital signs in last 24 hours: Temp:  [97.9 F (36.6 C)-98.9 F (37.2 C)] 98.5 F (36.9 C) (10/06 0707) Pulse Rate:  [100-126] 123 (10/06 0707) Resp:  [16-18] 18 (10/06 0707) BP: (96-119)/(67-78) 96/68 (10/06 0707) SpO2:  [99 %-100 %] 100 % (10/06 0707) Last BM Date: 09/27/17 (like a smear)  Intake/Output from previous day: 10/05 0701 - 10/06 0700 In: 837 [P.O.:837] Out: -  Intake/Output this shift: No intake/output data recorded.  Gen: NAD, resting comfortably CV: RRR Pulm: Normal work of breathing Abd: Soft, NT/ND Ext: SCDs in place  Lab Results: BMET  Recent Labs  09/27/17 0622  NA 133*  K 3.7  CL 93*  CO2 32  GLUCOSE 103*  BUN 24*  CREATININE 1.03  CALCIUM 9.0    Anti-infectives: Anti-infectives    Start     Dose/Rate Route Frequency Ordered Stop   09/27/17 1130  ciprofloxacin (CIPRO) tablet 500 mg     500 mg Oral 2 times daily 09/27/17 1050 10/02/17 0759   09/27/17 0800  cefTRIAXone (ROCEPHIN) 1 g in dextrose 5 % 50 mL IVPB  Status:  Discontinued    Comments:  Pharmacy may adjust dosing strength / duration / interval for maximal efficacy   1 g 100 mL/hr over 30 Minutes Intravenous Every 24 hours 09/27/17 0756 09/27/17 1050   09/19/17 1430  ceFAZolin (ANCEF) IVPB 2g/100 mL premix     2 g 200 mL/hr over 30 Minutes Intravenous Every 8 hours 09/19/17 1428 09/20/17 0548   09/19/17 1229  vancomycin (VANCOCIN) powder  Status:  Discontinued       As needed 09/19/17 1229 09/19/17 1239   09/19/17 1000  ceFAZolin (ANCEF) IVPB 2 g/50 mL premix     2 g 100 mL/hr over 30 Minutes Intravenous  Once 09/19/17 0947 09/19/17 1026   09/19/17 0946  ceFAZolin (ANCEF) 2-4 GM/100ML-% IVPB    Comments:  [name], [name]   : cabinet override      09/19/17 0946 09/19/17 2159   09/09/17 2100  ceFAZolin (ANCEF) IVPB 1 g/50 mL premix     1 g 100 mL/hr over 30 Minutes Intravenous Every 8  hours 09/09/17 1548 09/10/17 1610       Assessment/Plan: PHBC 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 Dual antiplatelet TX per Stroke Service.  L first rib FX with tiny occult PTX- PTX resolved, pain control, IS  R elbow abrasions- local wound care R tib fib FX- s/p ORIF 9/27 Dr. Everardo Pacific - TDWB RLE in boot (Dr. Everardo Pacific to order the appropriate boot) - wound care per ortho - 2-3 week f/u for suture removal  Agitation- scheduled seroquel, ativan q4h prn, vail bed Hx of Bipolar disorder- psych consulted, restarted home depakote 500 mg PO QHS, can be titrated to 1000 mg PO QHS - check Valproic acid level after 5 days for therapeutic window UTI - culture pending; UA significant for many bacteria, nitrites, and leuks - will start empiric tx w Ceftriaxone FEN- DYS 2 with honey thick  ID - Ceftriaxone 10/5 >>  VTE- ASA 325 mg, plavix 75 mg; SCDs Dispo-  LOG SNF once all restraints d/c-ed   LOS: 22 days   Andria Meuse, MD [facility]

## 2017-09-29 LAB — URINE CULTURE

## 2017-09-29 MED ORDER — CEPHALEXIN 500 MG PO CAPS
500.0000 mg | ORAL_CAPSULE | Freq: Two times a day (BID) | ORAL | Status: DC
  Administered 2017-09-29 – 2017-09-30 (×2): 500 mg via ORAL
  Filled 2017-09-29 (×2): qty 1

## 2017-09-29 MED ORDER — CEPHALEXIN 500 MG PO CAPS: 500.0000 mg | ORAL_CAPSULE | Freq: Two times a day (BID) | ORAL | Status: DC

## 2017-09-29 NOTE — Progress Notes (Signed)
10 Days Post-Op   Subjective/Chief Complaint: AWAKE ALERT very coherent wants to know when veil bed can be removed    Objective: Vital signs in last 24 hours: Temp:  [97.4 F (36.3 C)-98.4 F (36.9 C)] 97.4 F (36.3 C) (10/07 0549) Pulse Rate:  [108-125] 125 (10/07 0549) Resp:  [18] 18 (10/07 0549) BP: (96-120)/(66-85) 120/85 (10/07 0549) SpO2:  [99 %-100 %] 100 % (10/07 0549) Last BM Date: 09/28/17  Intake/Output from previous day: 10/06 0701 - 10/07 0700 In: 120 [P.O.:120] Out: -  Intake/Output this shift: No intake/output data recorded.  Pulm:  No increase WOB CV RRR  ABDOMEN SOFT  Neuro: awake alert follows commands cognition stable   Lab Results:  No results for input(s): WBC, HGB, HCT, PLT in the last 72 hours. BMET  Recent Labs  09/27/17 0622  NA 133*  K 3.7  CL 93*  CO2 32  GLUCOSE 103*  BUN 24*  CREATININE 1.03  CALCIUM 9.0   PT/INR No results for input(s): LABPROT, INR in the last 72 hours. ABG No results for input(s): PHART, HCO3 in the last 72 hours.  Invalid input(s): PCO2, PO2  Studies/Results: No results found.  Anti-infectives: Anti-infectives    Start     Dose/Rate Route Frequency Ordered Stop   09/27/17 1130  ciprofloxacin (CIPRO) tablet 500 mg     500 mg Oral 2 times daily 09/27/17 1050 10/02/17 0759   09/27/17 0800  cefTRIAXone (ROCEPHIN) 1 g in dextrose 5 % 50 mL IVPB  Status:  Discontinued    Comments:  Pharmacy may adjust dosing strength / duration / interval for maximal efficacy   1 g 100 mL/hr over 30 Minutes Intravenous Every 24 hours 09/27/17 0756 09/27/17 1050   09/19/17 1430  ceFAZolin (ANCEF) IVPB 2g/100 mL premix     2 g 200 mL/hr over 30 Minutes Intravenous Every 8 hours 09/19/17 1428 09/20/17 0548   09/19/17 1229  vancomycin (VANCOCIN) powder  Status:  Discontinued       As needed 09/19/17 1229 09/19/17 1239   09/19/17 1000  ceFAZolin (ANCEF) IVPB 2 g/50 mL premix     2 g 100 mL/hr over 30 Minutes Intravenous   Once 09/19/17 0947 09/19/17 1026   09/19/17 0946  ceFAZolin (ANCEF) 2-4 GM/100ML-% IVPB    Comments:  [name], [name]   : cabinet override      09/19/17 0946 09/19/17 2159   09/09/17 2100  ceFAZolin (ANCEF) IVPB 1 g/50 mL premix     1 g 100 mL/hr over 30 Minutes Intravenous Every 8 hours 09/09/17 1548 09/10/17 1610      Assessment/Plan: 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 Dual antiplatelet TX per Stroke Service.  L first rib FX with tiny occult PTX- PTX resolved, pain control, IS  R elbow abrasions- local wound care R tib fib FX- s/p ORIF 9/27 Dr. Everardo Pacific - TDWB RLE in boot (Dr. Everardo Pacific to order the appropriate boot) - wound care per ortho - 2-3 week f/u for suture removal  Agitation- scheduled seroquel, ativan q4h prn, vail bed Hx of Bipolar disorder- psych consulted, restarted home depakote 500 mg PO QHS, can be titrated to 1000  mg PO QHS - check Valproic acid level after 5 days for therapeutic window UTI - insufficient growth stop ABX  FEN- DYS 2 with honey thick  ID- STOP FOR NOW  VTE- ASA 325 mg, plavix 75 mg; SCDs Dispo- LOG SNF once all restraints d/c-ed    LOS: 23 days    Dustin Marquez A. 09/29/2017

## 2017-09-29 NOTE — Progress Notes (Signed)
1 mg ativan removed from pyxis, patient required for 2 mg dose. Medication scan and charted on EMR witness by second RN.

## 2017-09-30 LAB — VALPROIC ACID LEVEL: VALPROIC ACID LVL: 51 ug/mL (ref 50.0–100.0)

## 2017-09-30 MED ORDER — CEPHALEXIN 500 MG PO CAPS
500.0000 mg | ORAL_CAPSULE | Freq: Four times a day (QID) | ORAL | Status: DC
  Administered 2017-09-30 – 2017-10-01 (×3): 500 mg via ORAL
  Filled 2017-09-30 (×3): qty 1

## 2017-09-30 MED ORDER — DIVALPROEX SODIUM ER 250 MG PO TB24
750.0000 mg | ORAL_TABLET | Freq: Every day | ORAL | Status: DC
  Administered 2017-09-30 – 2017-10-04 (×5): 750 mg via ORAL
  Filled 2017-09-30 (×5): qty 1

## 2017-09-30 NOTE — Progress Notes (Addendum)
[facility] Progress Note  11 Days Post-Op  Subjective: CC:  NAE over night. Patient has already worked with PT and OT this morning. Per RN he was complaining of severe pain so he was given fentanyl. He's now resting very heavily but does respond.  Objective: Vital signs in last 24 hours: Temp:  [97.4 F (36.3 C)-98.8 F (37.1 C)] 98.2 F (36.8 C) (10/08 0521) Pulse Rate:  [75-129] 129 (10/08 0521) Resp:  [18] 18 (10/08 0521) BP: (107-120)/(78-84) 120/84 (10/08 0521) SpO2:  [97 %-100 %] 100 % (10/08 0521) Last BM Date: 09/28/17  Intake/Output from previous day: 10/07 0701 - 10/08 0700 In: 603 [P.O.:600; I.V.:3] Out: -  Intake/Output this shift: No intake/output data recorded.  PE: Gen:  NAD, resting comfortably but does respond "what" to verbal stimulus HEENT: left pupil oblong, right pupil small and circular Card:  RRR, pedal pulses 2+ BL Pulm:  Normal effort, CTAB Abd: Soft, non-tender, non-distended, +BS, no HSM Skin: warm and dry, no rashes  Psych: unable to assess Ext: hinged brace and boot RLE  Lab Results:  No results for input(s): WBC, HGB, HCT, PLT in the last 72 hours. BMET No results for input(s): NA, K, CL, CO2, GLUCOSE, BUN, CREATININE, CALCIUM in the last 72 hours. PT/INR No results for input(s): LABPROT, INR in the last 72 hours. CMP     Component Value Date/Time   NA 133 (L) 09/27/2017 0622   K 3.7 09/27/2017 0622   CL 93 (L) 09/27/2017 0622   CO2 32 09/27/2017 0622   GLUCOSE 103 (H) 09/27/2017 0622   BUN 24 (H) 09/27/2017 0622   CREATININE 1.03 09/27/2017 0622   CALCIUM 9.0 09/27/2017 0622   PROT 5.7 (L) 09/09/2017 0143   ALBUMIN 2.7 (L) 09/09/2017 0143   AST 86 (H) 09/09/2017 0143   ALT 115 (H) 09/09/2017 0143   ALKPHOS 36 (L) 09/09/2017 0143   BILITOT 1.5 (H) 09/09/2017 0143   GFRNONAA >60 09/27/2017 0622   GFRAA >60 09/27/2017 0622   Lipase  No results found for: LIPASE     Studies/Results: No results  found.  Anti-infectives: Anti-infectives    Start     Dose/Rate Route Frequency Ordered Stop   09/29/17 2200  cephALEXin (KEFLEX) capsule 500 mg     500 mg Oral 2 times daily 09/29/17 1407 10/02/17 0959   09/29/17 1415  cephALEXin (KEFLEX) capsule 500 mg  Status:  Discontinued     500 mg Oral 2 times daily 09/29/17 1402 09/29/17 1407   09/27/17 1130  ciprofloxacin (CIPRO) tablet 500 mg  Status:  Discontinued     500 mg Oral 2 times daily 09/27/17 1050 09/29/17 1402   09/27/17 0800  cefTRIAXone (ROCEPHIN) 1 g in dextrose 5 % 50 mL IVPB  Status:  Discontinued    Comments:  Pharmacy may adjust dosing strength / duration / interval for maximal efficacy   1 g 100 mL/hr over 30 Minutes Intravenous Every 24 hours 09/27/17 0756 09/27/17 1050   09/19/17 1430  ceFAZolin (ANCEF) IVPB 2g/100 mL premix     2 g 200 mL/hr over 30 Minutes Intravenous Every 8 hours 09/19/17 1428 09/20/17 0548   09/19/17 1229  vancomycin (VANCOCIN) powder  Status:  Discontinued       As needed 09/19/17 1229 09/19/17 1239   09/19/17 1000  ceFAZolin (ANCEF) IVPB 2 g/50 mL premix     2 g 100 mL/hr over 30 Minutes Intravenous  Once 09/19/17 0947 09/19/17 1026  09/19/17 0946  ceFAZolin (ANCEF) 2-4 GM/100ML-% IVPB    Comments:  [name], [name]   : cabinet override      09/19/17 0946 09/19/17 2159   09/09/17 2100  ceFAZolin (ANCEF) IVPB 1 g/50 mL premix     1 g 100 mL/hr over 30 Minutes Intravenous Every 8 hours 09/09/17 1548 09/10/17 5621     Assessment/Plan 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 Dual antiplatelet TX per Stroke Service.  L first rib FX with tiny occult PTX- PTX  resolved, pain control, IS  R elbow abrasions- local wound care R tib fib FX- s/p ORIF 9/27 Dr. Everardo Pacific - TDWB RLE in boot and unlocked hinged knee brace - wound care per ortho - 3 week f/u for suture removal  - increased keflex to QID on 10/8. Per ortho:If not improving may need washout and reclosure this week Agitation- scheduled seroquel, ativan q4h prn, vail bed Hx of Bipolar disorder- psych consulted, restarted home depakote 500 mg PO QHS, can be titrated to 1000 mg PO QHS - valproic acid 51 today. Increase to depakote  qd today and recheck valproic acid in about 3 days UTI - insufficient growth; ABX stopped 10/7 FEN- DYS 2 with honey thick  ID- ancef 10/5, keflex 10/7 VTE- ASA 325 mg, plavix 75 mg; SCDs Dispo- LOG SNF once all restraints d/c-ed. Continue therapies and hopefully can d/c vail bed and transition to soft restraints in near future?   LOS: 24 days    Franne Forts, [facility] 09/30/2017, 11:35 AM Pager: [phone] Consults: [phone] Mon-Fri 7:00 am-4:30 pm Sat-Sun 7:00 am-11:30 am

## 2017-09-30 NOTE — Clinical Social Work Note (Signed)
Clinical Social Worker continuing to follow patient and family for support and discharge planning needs.  Patient remains in the vail bed, therefore not appropriate for SNF placement at this time.  CSW will continue to follow and facilitate patient discharge needs once medically stable.  Jesse Hazelyn Kallen, LCSW [phone] 
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
## 2017-09-30 NOTE — Progress Notes (Signed)
ORTHOPAEDIC PROGRESS NOTE  s/p Procedure(s): EXTERNAL FIXATION RIGHT LOWER LEG and ORIF w ex fix removal 9/27  SUBJECTIVE: NAD  OBJECTIVE: PE:RLE - knee with mild effusion.  Hinged brace in place  Incisions clean dry and intact though some lightening of skin around distal incision, some fibrinous drainage, wwp foot, unable to participate in voluntary motor and sensory function testing due to agitation  Vitals:   09/30/17 0235 09/30/17 0521  BP: 114/82 120/84  Pulse: (!) 122 (!) 129  Resp: 18 18  Temp: (!) 97.4 F (36.3 C) 98.2 F (36.8 C)  SpO2: 100% 100%     ASSESSMENT: Dustin Marquez is a 36 y.o. male with right tibia fracture sp Ex fix and ex fix removal and ORIF of tibia 9/27 and Right laterally based ligamentous injury of knee.    PLAN: - Would increase keflex to qid while some fibrinous drainage from ankle.  If not improving may need washout and reclosure this week. -TDWB RLE  In boot and hinged brace unlocked. -Daily RN dressing changes addressing medial incision most carefully.  Discussed with new RN team today. -Elevate operative extremity as tolerated to minimize swelling - plan for 3 week postop follow up for suture removal
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
## 2017-10-01 MED ORDER — MIDAZOLAM HCL 2 MG/ML PO SYRP
2.0000 mg | ORAL_SOLUTION | ORAL | Status: DC | PRN
  Administered 2017-10-03: 4 mg via ORAL
  Filled 2017-10-01: qty 2

## 2017-10-01 MED ORDER — LORAZEPAM 1 MG PO TABS
1.0000 mg | ORAL_TABLET | ORAL | Status: DC | PRN
  Administered 2017-10-02 – 2017-11-11 (×9): 2 mg via ORAL
  Filled 2017-10-01 (×9): qty 2

## 2017-10-01 MED ORDER — CEPHALEXIN 500 MG PO CAPS
500.0000 mg | ORAL_CAPSULE | Freq: Four times a day (QID) | ORAL | Status: DC
  Administered 2017-10-01 – 2017-10-02 (×3): 500 mg via ORAL
  Filled 2017-10-01 (×4): qty 1

## 2017-10-01 NOTE — Progress Notes (Signed)
[facility] Progress Note  12 Days Post-Op  Subjective: CC:  Alert and getting back in vail bed with nursing staff. C/o RLE pain and pruritis under boot. Reports he is using the bathroom without issue.  Objective: Vital signs in last 24 hours: Temp:  [97.9 F (36.6 C)-98.8 F (37.1 C)] 98 F (36.7 C) (10/09 0458) Pulse Rate:  [85-128] 111 (10/09 0458) Resp:  [18-20] 20 (10/09 0458) BP: (111-133)/(84-92) 133/91 (10/09 0458) SpO2:  [98 %-100 %] 99 % (10/09 0458) Last BM Date: 09/30/17  Intake/Output from previous day: 10/08 0701 - 10/09 0700 In: 480 [P.O.:480] Out: -  Intake/Output this shift: No intake/output data recorded.  PE: Gen:  NAD, cooperative HEENT: left pupil oblong, right pupil small and circular Card:  RRR, pedal pulses 2+ BL Pulm:  Normal effort, CTAB Abd: Soft, non-tender, non-distended, +BS, no HSM Skin: warm and dry, no rashes  Psych: unable to assess Ext: hinged brace and boot RLE  Lab Results:  CMP     Component Value Date/Time   NA 133 (L) 09/27/2017 0622   K 3.7 09/27/2017 0622   CL 93 (L) 09/27/2017 0622   CO2 32 09/27/2017 0622   GLUCOSE 103 (H) 09/27/2017 0622   BUN 24 (H) 09/27/2017 0622   CREATININE 1.03 09/27/2017 0622   CALCIUM 9.0 09/27/2017 0622   PROT 5.7 (L) 09/09/2017 0143   ALBUMIN 2.7 (L) 09/09/2017 0143   AST 86 (H) 09/09/2017 0143   ALT 115 (H) 09/09/2017 0143   ALKPHOS 36 (L) 09/09/2017 0143   BILITOT 1.5 (H) 09/09/2017 0143   GFRNONAA >60 09/27/2017 0622   GFRAA >60 09/27/2017 0622   Lipase  No results found for: LIPASE   Studies/Results: No results found.  Anti-infectives: Anti-infectives    Start     Dose/Rate Route Frequency Ordered Stop   09/30/17 1800  cephALEXin (KEFLEX) capsule 500 mg  Status:  Discontinued     500 mg Oral Every 6 hours 09/30/17 1018 10/01/17 0746   09/29/17 2200  cephALEXin (KEFLEX) capsule 500 mg  Status:  Discontinued     500 mg Oral 2 times daily 09/29/17 1407 09/30/17  1018   09/29/17 1415  cephALEXin (KEFLEX) capsule 500 mg  Status:  Discontinued     500 mg Oral 2 times daily 09/29/17 1402 09/29/17 1407   09/27/17 1130  ciprofloxacin (CIPRO) tablet 500 mg  Status:  Discontinued     500 mg Oral 2 times daily 09/27/17 1050 09/29/17 1402   09/27/17 0800  cefTRIAXone (ROCEPHIN) 1 g in dextrose 5 % 50 mL IVPB  Status:  Discontinued    Comments:  Pharmacy may adjust dosing strength / duration / interval for maximal efficacy   1 g 100 mL/hr over 30 Minutes Intravenous Every 24 hours 09/27/17 0756 09/27/17 1050   09/19/17 1430  ceFAZolin (ANCEF) IVPB 2g/100 mL premix     2 g 200 mL/hr over 30 Minutes Intravenous Every 8 hours 09/19/17 1428 09/20/17 0548   09/19/17 1229  vancomycin (VANCOCIN) powder  Status:  Discontinued       As needed 09/19/17 1229 09/19/17 1239   09/19/17 1000  ceFAZolin (ANCEF) IVPB 2 g/50 mL premix     2 g 100 mL/hr over 30 Minutes Intravenous  Once 09/19/17 0947 09/19/17 1026   09/19/17 0946  ceFAZolin (ANCEF) 2-4 GM/100ML-% IVPB    Comments:  [name], [name]   : cabinet override      09/19/17 0946 09/19/17 2159   09/09/17  2100  ceFAZolin (ANCEF) IVPB 1 g/50 mL premix     1 g 100 mL/hr over 30 Minutes Intravenous Every 8 hours 09/09/17 1548 09/10/17 4098       Assessment/Plan 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 Dual antiplatelet TX per Stroke Service.  L first rib FX with tiny occult PTX- PTX resolved, pain control, IS  R elbow abrasions- local wound care R tib fib FX- s/p ORIF 9/27 Dr. Everardo Pacific - TDWB RLE in boot and unlocked hinged knee brace; elevate as able - daily dressing change by  RN- 3 week f/u for suture removal  - increased keflex to QID on 10/8. Per ortho:If not improving may need washout and reclosure this week Agitation- scheduled seroquel, ativan q4h prn, vail bed Hx of Bipolar disorder- psych consulted, restarted home depakote 500 mg PO QHS, can be titrated to 1000 mg PO QHS - valproic acid 51 10/8. Increased depakote to  qd on 10/8,  recheck valproic acid 10/11 or 10/12 UTI - insufficient growth; ABX stopped 10/7 FEN- DYS 2 with honey thick  ID- ancef 10/5, keflex 10/7 VTE- ASA 325 mg, plavix 75 mg; SCDs Dispo- LOG SNF once all restraints d/c-ed. Continue therapies and hopefully can d/c vail bed and transition to soft restraints in near future?    LOS: 25 days    Dustin Marquez , [facility] Surgery 10/01/2017, 9:30 AM Pager: [phone] Consults: [phone] Mon-Fri 7:00 am-4:30 pm Sat-Sun 7:00 am-11:30 am
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
## 2017-10-02 ENCOUNTER — Inpatient Hospital Stay (HOSPITAL_COMMUNITY): Payer: Medicaid Other | Admitting: Certified Registered Nurse Anesthetist

## 2017-10-02 ENCOUNTER — Encounter (HOSPITAL_COMMUNITY): Admission: EM | Disposition: A | Discharge status: Skilled Nursing Facility | Payer: Self-pay | Source: Home / Self Care

## 2017-10-02 ENCOUNTER — Encounter (HOSPITAL_COMMUNITY): Payer: Self-pay | Admitting: *Deleted

## 2017-10-02 DIAGNOSIS — S82409A Unspecified fracture of shaft of unspecified fibula, initial encounter for closed fracture: Secondary | ICD-10-CM

## 2017-10-02 DIAGNOSIS — T847XXA Infection and inflammatory reaction due to other internal orthopedic prosthetic devices, implants and grafts, initial encounter: Secondary | ICD-10-CM

## 2017-10-02 DIAGNOSIS — S82401H Unspecified fracture of shaft of right fibula, subsequent encounter for open fracture type I or II with delayed healing: Secondary | ICD-10-CM

## 2017-10-02 DIAGNOSIS — S82201H Unspecified fracture of shaft of right tibia, subsequent encounter for open fracture type I or II with delayed healing: Secondary | ICD-10-CM

## 2017-10-02 DIAGNOSIS — S82209A Unspecified fracture of shaft of unspecified tibia, initial encounter for closed fracture: Secondary | ICD-10-CM

## 2017-10-02 DIAGNOSIS — Z8709 Personal history of other diseases of the respiratory system: Secondary | ICD-10-CM

## 2017-10-02 HISTORY — PX: I & D EXTREMITY: SHX5045

## 2017-10-02 SURGERY — IRRIGATION AND DEBRIDEMENT EXTREMITY
Anesthesia: General | Laterality: Right

## 2017-10-02 MED ORDER — DEXAMETHASONE SODIUM PHOSPHATE 10 MG/ML IJ SOLN
INTRAMUSCULAR | Status: DC | PRN
  Administered 2017-10-02: 10 mg via INTRAVENOUS

## 2017-10-02 MED ORDER — VANCOMYCIN HCL IN DEXTROSE 1-5 GM/200ML-% IV SOLN
1000.0000 mg | INTRAVENOUS | Status: DC
  Filled 2017-10-02: qty 200

## 2017-10-02 MED ORDER — MIDAZOLAM HCL 2 MG/2ML IJ SOLN
INTRAMUSCULAR | Status: AC
  Filled 2017-10-02: qty 2

## 2017-10-02 MED ORDER — DEXAMETHASONE SODIUM PHOSPHATE 10 MG/ML IJ SOLN
INTRAMUSCULAR | Status: AC
  Filled 2017-10-02: qty 1

## 2017-10-02 MED ORDER — FENTANYL CITRATE (PF) 250 MCG/5ML IJ SOLN
INTRAMUSCULAR | Status: AC
  Filled 2017-10-02: qty 5

## 2017-10-02 MED ORDER — ONDANSETRON HCL 4 MG/2ML IJ SOLN
INTRAMUSCULAR | Status: AC
  Filled 2017-10-02: qty 2

## 2017-10-02 MED ORDER — TOBRAMYCIN SULFATE 1.2 G IJ SOLR
INTRAMUSCULAR | Status: AC
  Filled 2017-10-02: qty 1.2

## 2017-10-02 MED ORDER — ONDANSETRON HCL 4 MG/2ML IJ SOLN: 4.0000 mg | Freq: Once | INTRAMUSCULAR | Status: DC | PRN

## 2017-10-02 MED ORDER — PROPOFOL 10 MG/ML IV BOLUS
INTRAVENOUS | Status: AC
  Filled 2017-10-02: qty 20

## 2017-10-02 MED ORDER — MIDAZOLAM HCL 2 MG/2ML IJ SOLN
2.0000 mg | Freq: Once | INTRAMUSCULAR | Status: AC
  Administered 2017-10-02: 2 mg via INTRAVENOUS

## 2017-10-02 MED ORDER — VANCOMYCIN HCL 1000 MG IV SOLR
INTRAVENOUS | Status: AC
  Filled 2017-10-02: qty 1000

## 2017-10-02 MED ORDER — LEVOFLOXACIN 750 MG PO TABS
750.0000 mg | ORAL_TABLET | Freq: Every day | ORAL | Status: DC
  Administered 2017-10-02 – 2017-10-04 (×3): 750 mg via ORAL
  Filled 2017-10-02 (×3): qty 1

## 2017-10-02 MED ORDER — SODIUM CHLORIDE 0.9 % IR SOLN
Status: DC | PRN
  Administered 2017-10-02: 3000 mL

## 2017-10-02 MED ORDER — LIDOCAINE HCL (CARDIAC) 20 MG/ML IV SOLN
INTRAVENOUS | Status: DC | PRN
  Administered 2017-10-02: 60 mg via INTRAVENOUS

## 2017-10-02 MED ORDER — VANCOMYCIN HCL 1000 MG IV SOLR
INTRAVENOUS | Status: DC | PRN
  Administered 2017-10-02: 1000 mg

## 2017-10-02 MED ORDER — TOBRAMYCIN SULFATE 1.2 G IJ SOLR
INTRAMUSCULAR | Status: DC | PRN
Start: 2017-10-02 — End: 2017-10-02
  Administered 2017-10-02: 1.2 g

## 2017-10-02 MED ORDER — MIDAZOLAM HCL 5 MG/5ML IJ SOLN
INTRAMUSCULAR | Status: DC | PRN
  Administered 2017-10-02: 2 mg via INTRAVENOUS

## 2017-10-02 MED ORDER — MIDAZOLAM HCL 2 MG/2ML IJ SOLN
INTRAMUSCULAR | Status: AC
  Administered 2017-10-02: 1 mg via INTRAVENOUS
  Filled 2017-10-02: qty 2

## 2017-10-02 MED ORDER — VANCOMYCIN HCL 1000 MG IV SOLR
INTRAVENOUS | Status: DC | PRN
  Administered 2017-10-02: 1000 mg via INTRAVENOUS

## 2017-10-02 MED ORDER — FENTANYL CITRATE (PF) 100 MCG/2ML IJ SOLN: 25.0000 ug | INTRAMUSCULAR | Status: DC | PRN

## 2017-10-02 MED ORDER — LACTATED RINGERS IV SOLN
INTRAVENOUS | Status: DC
  Administered 2017-10-02 – 2017-10-16 (×2): via INTRAVENOUS

## 2017-10-02 MED ORDER — LINEZOLID 600 MG PO TABS
600.0000 mg | ORAL_TABLET | Freq: Two times a day (BID) | ORAL | Status: DC
  Filled 2017-10-02: qty 1

## 2017-10-02 MED ORDER — FENTANYL CITRATE (PF) 100 MCG/2ML IJ SOLN
INTRAMUSCULAR | Status: DC | PRN
  Administered 2017-10-02 (×2): 25 ug via INTRAVENOUS
  Administered 2017-10-02 (×2): 50 ug via INTRAVENOUS

## 2017-10-02 MED ORDER — PROPOFOL 10 MG/ML IV BOLUS
INTRAVENOUS | Status: DC | PRN
  Administered 2017-10-02: 160 mg via INTRAVENOUS

## 2017-10-02 MED ORDER — MIDAZOLAM HCL 2 MG/2ML IJ SOLN
1.0000 mg | Freq: Once | INTRAMUSCULAR | Status: AC
  Administered 2017-10-02: 1 mg via INTRAVENOUS

## 2017-10-02 MED ORDER — LINEZOLID 600 MG PO TABS
600.0000 mg | ORAL_TABLET | Freq: Two times a day (BID) | ORAL | Status: DC
  Administered 2017-10-02 – 2017-10-04 (×4): 600 mg via ORAL
  Filled 2017-10-02 (×5): qty 1

## 2017-10-02 MED ORDER — ONDANSETRON HCL 4 MG/2ML IJ SOLN
INTRAMUSCULAR | Status: DC | PRN
  Administered 2017-10-02: 4 mg via INTRAVENOUS

## 2017-10-02 SURGICAL SUPPLY — 44 items
BANDAGE ACE 4X5 VEL STRL LF (GAUZE/BANDAGES/DRESSINGS) ×5 IMPLANT
BANDAGE ACE 6X5 VEL STRL LF (GAUZE/BANDAGES/DRESSINGS) ×3 IMPLANT
BNDG GAUZE ELAST 4 BULKY (GAUZE/BANDAGES/DRESSINGS) ×3 IMPLANT
BRUSH FEMORAL CANAL (MISCELLANEOUS) ×2 IMPLANT
CONT SPEC 4OZ CLIKSEAL STRL BL (MISCELLANEOUS) ×4 IMPLANT
COVER SURGICAL LIGHT HANDLE (MISCELLANEOUS) ×3 IMPLANT
CUFF TOURN SGL LL 12 NO SLV (MISCELLANEOUS) IMPLANT
CUFF TOURNIQUET SINGLE 24IN (TOURNIQUET CUFF) ×2 IMPLANT
CUFF TOURNIQUET SINGLE 34IN LL (TOURNIQUET CUFF) IMPLANT
DRAPE U-SHAPE 47X51 STRL (DRAPES) IMPLANT
DRSG PAD ABDOMINAL 8X10 ST (GAUZE/BANDAGES/DRESSINGS) ×3 IMPLANT
DURAPREP 26ML APPLICATOR (WOUND CARE) ×3 IMPLANT
ELECT REM PT RETURN 9FT ADLT (ELECTROSURGICAL)
ELECTRODE REM PT RTRN 9FT ADLT (ELECTROSURGICAL) IMPLANT
GAUZE SPONGE 4X4 12PLY STRL (GAUZE/BANDAGES/DRESSINGS) ×5 IMPLANT
GAUZE XEROFORM 1X8 LF (GAUZE/BANDAGES/DRESSINGS) ×3 IMPLANT
GLOVE BIOGEL PI IND STRL 8 (GLOVE) ×1 IMPLANT
GLOVE BIOGEL PI INDICATOR 8 (GLOVE) ×2
GLOVE SURG ORTHO 8.0 STRL STRW (GLOVE) ×16 IMPLANT
GOWN STRL REUS W/ TWL LRG LVL3 (GOWN DISPOSABLE) ×1 IMPLANT
GOWN STRL REUS W/ TWL XL LVL3 (GOWN DISPOSABLE) ×1 IMPLANT
GOWN STRL REUS W/TWL LRG LVL3 (GOWN DISPOSABLE) ×3
GOWN STRL REUS W/TWL XL LVL3 (GOWN DISPOSABLE) ×3
HANDPIECE INTERPULSE COAX TIP (DISPOSABLE)
KIT BASIN OR (CUSTOM PROCEDURE TRAY) ×3 IMPLANT
KIT ROOM TURNOVER OR (KITS) ×3 IMPLANT
MANIFOLD NEPTUNE II (INSTRUMENTS) ×3 IMPLANT
NS IRRIG 1000ML POUR BTL (IV SOLUTION) ×3 IMPLANT
PACK ORTHO EXTREMITY (CUSTOM PROCEDURE TRAY) ×3 IMPLANT
PAD ARMBOARD 7.5X6 YLW CONV (MISCELLANEOUS) ×6 IMPLANT
PAD CAST 4YDX4 CTTN HI CHSV (CAST SUPPLIES) ×1 IMPLANT
PADDING CAST COTTON 4X4 STRL (CAST SUPPLIES) ×3
PADDING CAST COTTON 6X4 STRL (CAST SUPPLIES) ×3 IMPLANT
SET HNDPC FAN SPRY TIP SCT (DISPOSABLE) IMPLANT
SPONGE LAP 18X18 X RAY DECT (DISPOSABLE) IMPLANT
SUT ETHILON 1 TP 1 60 (SUTURE) ×2 IMPLANT
SUT ETHILON 2 0 FS 18 (SUTURE) ×5 IMPLANT
SWAB CULTURE ESWAB REG 1ML (MISCELLANEOUS) IMPLANT
TOWEL OR 17X24 6PK STRL BLUE (TOWEL DISPOSABLE) ×3 IMPLANT
TOWEL OR 17X26 10 PK STRL BLUE (TOWEL DISPOSABLE) ×3 IMPLANT
TUBE CONNECTING 12'X1/4 (SUCTIONS) ×1
TUBE CONNECTING 12X1/4 (SUCTIONS) ×2 IMPLANT
UNDERPAD 30X30 (UNDERPADS AND DIAPERS) ×3 IMPLANT
YANKAUER SUCT BULB TIP NO VENT (SUCTIONS) ×3 IMPLANT
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
## 2017-10-02 NOTE — H&P (View-Only) (Signed)
ORTHOPAEDIC PROGRESS NOTE  s/p Procedure(s): EXTERNAL FIXATION RIGHT LOWER LEG and ORIF w ex fix removal 9/27  SUBJECTIVE: NAD  OBJECTIVE: PE:RLE - knee with mild effusion.  Hinged brace in place  Incisions clean dry and intact though some lightening of skin around distal incision, some continued drainage, now purulent, wwp foot, unable to participate in voluntary motor and sensory function testing due to agitation  Vitals:   10/02/17 0059 10/02/17 0610  BP: 129/68 136/74  Pulse: 94 99  Resp: 18 20  Temp: 98 F (36.7 C) 98.6 F (37 C)  SpO2: 98% 97%     ASSESSMENT: Dustin Marquez is a 36 y.o. male with right tibia fracture sp Ex fix and ex fix removal and ORIF of tibia 9/27 and Right laterally based ligamentous injury of knee.    PLAN: - Plan for OR today -TDWB RLE  In boot and hinged brace unlocked. -Daily RN dressing changes addressing medial incision most carefully.  Discussed with new RN team today. -Elevate operative extremity as tolerated to minimize swelling - plan for 3 week postop follow up for suture removal

## 2017-10-02 NOTE — Anesthesia Procedure Notes (Addendum)
Procedure Name: LMA Insertion Date/Time: 10/02/2017 11:24 AM Performed by: Elliot Dally Pre-anesthesia Checklist: Patient identified, Emergency Drugs available, Suction available and Patient being monitored Patient Re-evaluated:Patient Re-evaluated prior to induction Oxygen Delivery Method: Circle system utilized Preoxygenation: Pre-oxygenation with 100% oxygen Induction Type: IV induction LMA: LMA inserted LMA Size: 4.0 Number of attempts: 1 Placement Confirmation: positive ETCO2 Tube secured with: Tape

## 2017-10-02 NOTE — Interval H&P Note (Signed)
History and Physical Interval Note:  10/02/2017 8:19 AM  Dustin Marquez  has presented today for surgery, with the diagnosis of R postop infection  The various methods of treatment have been discussed with the patient and family. After consideration of risks, benefits and other options for treatment, the patient has consented to  Procedure(s): IRRIGATION AND DEBRIDEMENT EXTREMITY (Right) as a surgical intervention .  The patient's history has been reviewed, patient examined, no change in status, stable for surgery.  I have reviewed the patient's chart and labs.  Questions were answered to the patient's satisfaction.     Bjorn Pippin
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
## 2017-10-02 NOTE — Progress Notes (Signed)
Upon initial assessment in PACU, patient's left pupil was irregular. [name], CRNA states same finding during surgery. After patient became responsive, pupils were equal and reactive. Patient back at baseline (Alert, oriented, moving all extremities, pupils equal and reactive).
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
## 2017-10-02 NOTE — Progress Notes (Deleted)
Per CRNA, patient's left pupil was irregular/dilated.

## 2017-10-02 NOTE — Op Note (Addendum)
Orthopaedic Surgery Operative Note (CSN: [id number])  Dustin Marquez  [birth year] Date of Surgery: 10/02/2017 Admit Date: 09/06/2017  Diagnoses:  R postop infection  Post-Op Diagnosis: Same  Procedures:   * IRRIGATION AND DEBRIDEMENT EXTREMITY 40981     Complex closure of dehisced wound 13160 Negative pressure dressing application 97605   Operative Finding Successful completion of planned procedure. There was purulent material around the plate deep at the distal incision, proximally the plate appeared clinically uninvolved.  Hardware was well fixed.  Skin closed under moderate tension but appropriate primary closure performed.    Post-operative plan: The patient will be sent back to floor status with strip vac in place.  The patient will be TDWB in boot and knee brace unlocked.  DVT prophylaxis per primary.  Plan for at least 3-4 days of negative pressure dressing before transition to dry dressings.  ID consult for IV antibiotics.  Surgeons:Primary: Bjorn Pippin, MD  Assistant: Tessa Lerner PA-C Location: [facility] OR ROOM 07 Anesthesia: General Antibiotics: Vancomycin  Tourniquet time:  Total Tourniquet Time Documented: Thigh (Right) - 29 minutes Total: Thigh (Right) - 29 minutes  Estimated Blood Loss: minimal Complications: None Specimens: 2 for culture Implants: * No implants in log *  Indications for Surgery:   Dustin Marquez is a 36 y.o. male with previous trauma and orthopedics had been involved in care of his RLE.  He developed a draining wound distally at his medial incision and this was not improving with oral antibiotics.  Benefits and risks of operative and nonoperative management were discussed prior to surgery with patient/guardian(s) and informed consent form was completed.  Specific risks including infection, need for additional surgery,    Procedure:   The patient was identified in the preoperative holding area where the surgical site was marked.  The patient was taken to the OR where a procedural timeout was called and the above noted anesthesia was induced.  The patient was positioned supine on regular bed.  Preoperative antibiotics were dosed.  The patient's right leg was prepped and draped in the usual sterile fashion.  A second preoperative timeout was called.      A tourniquet was used for the above listed time.  We removed his surgical sutures and his distal medial wound had opened/dehisced.  We immediately saw the plate below.  There was purulent, unhealthy appearing material adhering to the plate and this was debrided sharply with ronguer and curette.  We excisionally debrided any unhealthy skin and subcutaneous tissue and once the surgical bed was clear of this tissue we irrigated copiously with pulse lavage.  We used retractors to carefully lift the skin to debride and irrigate the proximal aspect of the plate while minimizing soft tissue disruption.    We at the point placed 1g of vancomycin and 1.2g of tobramycin powder in the incision as local antibiosis.  The incision was then closed carefully with non-absorbable suture with a sequence of modified vertical mattress trauma stitches and interrupted nylon sutures.  A sterile strip vac negative pressure dressing was placed with a good seal.   The patient was awoken from general anesthesia and taken to the PACU in stable condition without complication.   Tessa Lerner present and scrubbed throughout the case, critical for completion in a timely fashion, and for retraction, instrumentation, closure.
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
## 2017-10-02 NOTE — Transfer of Care (Signed)
Immediate Anesthesia Transfer of Care Note  Patient: Dustin Marquez  Procedure(s) Performed: IRRIGATION AND DEBRIDEMENT EXTREMITY (Right )  Patient Location: PACU  Anesthesia Type:General  Level of Consciousness: drowsy and patient cooperative  Airway & Oxygen Therapy: Patient Spontanous Breathing and Patient connected to nasal cannula oxygen  Post-op Assessment: Report given to RN and Post -op Vital signs reviewed and stable  Post vital signs: Reviewed and stable  Last Vitals:  Vitals:   10/02/17 0059 10/02/17 0610  BP: 129/68 136/74  Pulse: 94 99  Resp: 18 20  Temp: 36.7 C 37 C  SpO2: 98% 97%    Last Pain:  Vitals:   10/02/17 0915  TempSrc:   PainSc: Asleep      Patients Stated Pain Goal: 1 (10/02/17 0845)  Complications: No apparent anesthesia complications
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
## 2017-10-02 NOTE — Anesthesia Postprocedure Evaluation (Signed)
Anesthesia Post Note  Patient: Dustin Marquez  Procedure(s) Performed: IRRIGATION AND DEBRIDEMENT EXTREMITY (Right )     Patient location during evaluation: PACU Anesthesia Type: General Level of consciousness: awake and alert Pain management: pain level controlled Vital Signs Assessment: post-procedure vital signs reviewed and stable Respiratory status: spontaneous breathing, nonlabored ventilation, respiratory function stable and patient connected to nasal cannula oxygen Cardiovascular status: blood pressure returned to baseline and stable Postop Assessment: no apparent nausea or vomiting Anesthetic complications: no    Last Vitals:  Vitals:   10/02/17 1245 10/02/17 1300  BP: 98/80 106/75  Pulse: 92 89  Resp: 12 13  Temp:    SpO2: 100% 100%    Last Pain:  Vitals:   10/02/17 1245  TempSrc:   PainSc: Asleep                 Beryle Lathe
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
## 2017-10-02 NOTE — Progress Notes (Signed)
SLP Cancellation Note  Patient Details Name: LAWTON DOLLINGER MRN: [id number] DOB: 02-03-1981   Cancelled treatment:        Pt in OR. Will continue efforts.   Royce Macadamia 10/02/2017, 1:13 PM   Breck Coons Lonell Face.Ed [employer] [phone]

## 2017-10-02 NOTE — Anesthesia Preprocedure Evaluation (Addendum)
Anesthesia Evaluation  Patient identified by MRN, date of birth, ID band Patient unresponsive  General Assessment Comment:Remains intubated and sedated  Reviewed: Allergy & Precautions, NPO status , Patient's Chart, lab work & pertinent test results, Unable to perform ROS - Chart review only  History of Anesthesia Complications Negative for: history of anesthetic complications  Airway Mallampati: III  TM Distance: >3 FB Neck ROM: Limited    Dental  (+) Dental Advisory Given   Pulmonary Current Smoker,  L first rib FX with tiny occult PTX     Pulmonary exam normal breath sounds clear to auscultation       Cardiovascular + Peripheral Vascular Disease   Rhythm:Regular Rate:Normal  Proximal right ICA acute dissection  Mid left ICA injury Proximal left vertebral artery injury   Neuro/Psych Bipolar Disorder 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  negative neurological ROS     GI/Hepatic negative GI ROS, Elevated LFTs   Endo/Other  negative endocrine ROS  Renal/GU negative Renal ROS     Musculoskeletal   Abdominal   Peds  Hematology  (+) Blood dyscrasia (Hb 9.9, plt 120k), anemia , Thrombocytosis, leukocytosis   Anesthesia Other Findings   Reproductive/Obstetrics                           Anesthesia Physical  Anesthesia Plan  ASA: III  Anesthesia Plan: General   Post-op Pain Management:    Induction: Intravenous  PONV Risk Score and Plan: 3 and Ondansetron, Dexamethasone, Midazolam and Treatment may vary due to age or medical condition  Airway Management Planned: LMA  Additional Equipment:  None  Intra-op Plan:   Post-operative Plan: Extubation in OR  Informed Consent: I have reviewed the patients History and Physical, chart, labs and discussed the procedure including the risks, benefits and alternatives for the proposed anesthesia with the patient or authorized representative who has indicated his/her understanding and acceptance.   Dental advisory given  Plan Discussed with: CRNA  Anesthesia Plan Comments: (Glidescope in room as backup in case LMA does not seat well)       Anesthesia Quick Evaluation

## 2017-10-02 NOTE — Progress Notes (Signed)
ORTHOPAEDIC PROGRESS NOTE  s/p Procedure(s): EXTERNAL FIXATION RIGHT LOWER LEG and ORIF w ex fix removal 9/27  SUBJECTIVE: NAD  OBJECTIVE: PE:RLE - knee with mild effusion.  Hinged brace in place  Incisions clean dry and intact though some lightening of skin around distal incision, some continued drainage, now purulent, wwp foot, unable to participate in voluntary motor and sensory function testing due to agitation  Vitals:   10/02/17 0059 10/02/17 0610  BP: 129/68 136/74  Pulse: 94 99  Resp: 18 20  Temp: 98 F (36.7 C) 98.6 F (37 C)  SpO2: 98% 97%     ASSESSMENT: Dustin Marquez is a 36 y.o. male with right tibia fracture sp Ex fix and ex fix removal and ORIF of tibia 9/27 and Right laterally based ligamentous injury of knee.    PLAN: - Plan for OR today -TDWB RLE  In boot and hinged brace unlocked. -Daily RN dressing changes addressing medial incision most carefully.  Discussed with new RN team today. -Elevate operative extremity as tolerated to minimize swelling - plan for 3 week postop follow up for suture removal    

## 2017-10-03 ENCOUNTER — Encounter (HOSPITAL_COMMUNITY): Payer: Self-pay | Admitting: Orthopaedic Surgery

## 2017-10-03 ENCOUNTER — Inpatient Hospital Stay (HOSPITAL_COMMUNITY): Payer: Medicaid Other

## 2017-10-03 DIAGNOSIS — T847XXD Infection and inflammatory reaction due to other internal orthopedic prosthetic devices, implants and grafts, subsequent encounter: Secondary | ICD-10-CM

## 2017-10-03 LAB — BASIC METABOLIC PANEL
ANION GAP: 7 (ref 5–15)
BUN: 13 mg/dL (ref 6–20)
CALCIUM: 9.2 mg/dL (ref 8.9–10.3)
CO2: 31 mmol/L (ref 22–32)
Chloride: 99 mmol/L — ABNORMAL LOW (ref 101–111)
Creatinine, Ser: 0.93 mg/dL (ref 0.61–1.24)
GFR calc Af Amer: >60 mL/min (ref >60)
GLUCOSE: 101 mg/dL — AB (ref 65–99)
Potassium: 3.8 mmol/L (ref 3.5–5.1)
SODIUM: 137 mmol/L (ref 135–145)

## 2017-10-03 LAB — CBC
HCT: 35.5 % — ABNORMAL LOW (ref 39.0–52.0)
Hemoglobin: 11.5 g/dL — ABNORMAL LOW (ref 13.0–17.0)
MCH: 28.8 pg (ref 26.0–34.0)
MCHC: 32.4 g/dL (ref 30.0–36.0)
MCV: 88.8 fL (ref 78.0–100.0)
PLATELETS: 410 10*3/uL — AB (ref 150–400)
RBC: 4 MIL/uL — ABNORMAL LOW (ref 4.22–5.81)
RDW: 14.2 % (ref 11.5–15.5)
WBC: 10.6 10*3/uL — AB (ref 4.0–10.5)

## 2017-10-03 NOTE — Progress Notes (Signed)
VS post fall
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
## 2017-10-03 NOTE — Progress Notes (Signed)
[facility] Progress Note  1 Day Post-Op  Subjective: CC:  Sitting up in vail bed. Orthopedic surgeon at bedside. Patient reports that he slept well last night and is asking for his breakfast. Per nursing staff patient repeatedly unhooks his VAC tubing from his leg.   tachycardia past 24h HR 105-126 bpm; afebrile,   Objective: Vital signs in last 24 hours: Temp:  [97.9 F (36.6 C)-98 F (36.7 C)] 98 F (36.7 C) (10/11 0500) Pulse Rate:  [89-126] 108 (10/11 0500) Resp:  [12-20] 18 (10/11 0500) BP: (98-133)/(73-99) 124/87 (10/11 0500) SpO2:  [98 %-100 %] 100 % (10/11 0500) FiO2 (%):  [24 %] 24 % (10/10 1451) Last BM Date: 09/30/17  Intake/Output from previous day: 10/10 0701 - 10/11 0700 In: 945 [P.O.:120; I.V.:700] Out: 410 [Blood:410] Intake/Output this shift: No intake/output data recorded.  PE: Gen: NAD, cooperative Card: RRR, pedal pulses 2+ BL Pulm: Normal effort, CTAB Abd: Soft, non-tender Skin: warm and dry, no rashes  Psych: unable to assess Ext: Incisional VAC over RLE, hinged brace partially on. AROM toes in tact.   Lab Results:  No results for input(s): WBC, HGB, HCT, PLT in the last 72 hours. BMET No results for input(s): NA, K, CL, CO2, GLUCOSE, BUN, CREATININE, CALCIUM in the last 72 hours. PT/INR No results for input(s): LABPROT, INR in the last 72 hours. CMP     Component Value Date/Time   NA 133 (L) 09/27/2017 0622   K 3.7 09/27/2017 0622   CL 93 (L) 09/27/2017 0622   CO2 32 09/27/2017 0622   GLUCOSE 103 (H) 09/27/2017 0622   BUN 24 (H) 09/27/2017 0622   CREATININE 1.03 09/27/2017 0622   CALCIUM 9.0 09/27/2017 0622   PROT 5.7 (L) 09/09/2017 0143   ALBUMIN 2.7 (L) 09/09/2017 0143   AST 86 (H) 09/09/2017 0143   ALT 115 (H) 09/09/2017 0143   ALKPHOS 36 (L) 09/09/2017 0143   BILITOT 1.5 (H) 09/09/2017 0143   GFRNONAA >60 09/27/2017 0622   GFRAA >60 09/27/2017 0622   Lipase  No results found for:  LIPASE     Studies/Results: No results found.  Anti-infectives: Anti-infectives    Start     Dose/Rate Route Frequency Ordered Stop   10/02/17 1700  linezolid (ZYVOX) tablet 600 mg     600 mg Oral Every 12 hours 10/02/17 1532     10/02/17 1600  levofloxacin (LEVAQUIN) tablet 750 mg     750 mg Oral Daily 10/02/17 1532     10/02/17 1530  linezolid (ZYVOX) tablet 600 mg  Status:  Discontinued     600 mg Oral Every 12 hours 10/02/17 1529 10/02/17 1532   10/02/17 1148  tobramycin (NEBCIN) powder  Status:  Discontinued       As needed 10/02/17 1148 10/02/17 1215   10/02/17 1148  vancomycin (VANCOCIN) powder  Status:  Discontinued       As needed 10/02/17 1148 10/02/17 1215   10/02/17 1030  vancomycin (VANCOCIN) IVPB 1000 mg/200 mL premix  Status:  Discontinued     1,000 mg 200 mL/hr over 60 Minutes Intravenous To Surgery 10/02/17 0820 10/02/17 1133   10/01/17 1200  cephALEXin (KEFLEX) capsule 500 mg  Status:  Discontinued     500 mg Oral Every 6 hours 10/01/17 0933 10/02/17 1532   09/30/17 1800  cephALEXin (KEFLEX) capsule 500 mg  Status:  Discontinued     500 mg Oral Every 6 hours 09/30/17 1018 10/01/17 0746   09/29/17 2200  cephALEXin (KEFLEX) capsule 500 mg  Status:  Discontinued     500 mg Oral 2 times daily 09/29/17 1407 09/30/17 1018   09/29/17 1415  cephALEXin (KEFLEX) capsule 500 mg  Status:  Discontinued     500 mg Oral 2 times daily 09/29/17 1402 09/29/17 1407   09/27/17 1130  ciprofloxacin (CIPRO) tablet 500 mg  Status:  Discontinued     500 mg Oral 2 times daily 09/27/17 1050 09/29/17 1402   09/27/17 0800  cefTRIAXone (ROCEPHIN) 1 g in dextrose 5 % 50 mL IVPB  Status:  Discontinued    Comments:  Pharmacy may adjust dosing strength / duration / interval for maximal efficacy   1 g 100 mL/hr over 30 Minutes Intravenous Every 24 hours 09/27/17 0756 09/27/17 1050   09/19/17 1430  ceFAZolin (ANCEF) IVPB 2g/100 mL premix     2 g 200 mL/hr over 30 Minutes Intravenous Every 8  hours 09/19/17 1428 09/20/17 0548   09/19/17 1229  vancomycin (VANCOCIN) powder  Status:  Discontinued       As needed 09/19/17 1229 09/19/17 1239   09/19/17 1000  ceFAZolin (ANCEF) IVPB 2 g/50 mL premix     2 g 100 mL/hr over 30 Minutes Intravenous  Once 09/19/17 0947 09/19/17 1026   09/19/17 0946  ceFAZolin (ANCEF) 2-4 GM/100ML-% IVPB    Comments:  [name], [name]   : cabinet override      09/19/17 0946 09/19/17 2159   09/09/17 2100  ceFAZolin (ANCEF) IVPB 1 g/50 mL premix     1 g 100 mL/hr over 30 Minutes Intravenous Every 8 hours 09/09/17 1548 09/10/17 1610       Assessment/Plan 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 Dual antiplatelet TX per Stroke Service.  L first rib FX with tiny occult PTX- PTX resolved, pain control, IS  R elbow abrasions- local wound care R tib fib FX- s/p ORIF 9/27 Dr. Everardo Pacific -  Hardware became infected and pt taken back to OR 10/10 for I&D, complex closure, VAC placement  - TDWB RLE in boot and unlocked hinged knee brace; elevate as able - 3 week f/u for suture removal  - Appreciate ID (Dr. Daiva Eves) consult for antibiotic regimen - Oral zyvox  po bid and oral levaquin  daily for now, follow CX Agitation- scheduled seroquel, ativan q4h prn, vail bed Hx of Bipolar disorder- psych consulted, restarted home depakote 500 mg PO QHS, can be titrated to 1000 mg PO QHS - valproic acid 51 10/8. Increaseddepakote to  qd on 10/8, plan to recheck valproic acid 10/12 UTI - insufficient growth;ABX stopped 10/7 FEN- NPO for surgery  ID- ancef 10/5, keflex 10/7 VTE- ASA 325 mg, plavix 75 mg; SCDs  Dispo-  Vail bed, floor CBC/BMET pending    LOS: 27 days    Adam Phenix , [facility] 10/03/2017, 7:29 AM Pager: [phone] Consults: [phone] Mon-Fri 7:00 am-4:30 pm Sat-Sun 7:00 am-11:30 am

## 2017-10-03 NOTE — Progress Notes (Signed)
PT Cancellation Note  Patient Details Name: Dustin Marquez MRN: [id number] DOB: 03-Dec-1981   Cancelled Treatment:    Reason Eval/Treat Not Completed: Patient at procedure or test/unavailable. PT will continue to f/u as available.    Alessandra Bevels Eydie Wormley 10/03/2017, 1:33 PM

## 2017-10-03 NOTE — Progress Notes (Addendum)
Pt noted to be on all fours in floor of his room. Previously eating his dinner tray on side of Vail bed.  He has a small abrasion to right side of forehead.  He refuses to let his nurse assess stating "I'm fine!"  Diamantina Monks, RN notified Dr. Janee Morn.  No further tests ordered.  He is in [place] bed zipped and locked.

## 2017-10-03 NOTE — Progress Notes (Signed)
ORTHOPAEDIC PROGRESS NOTE  s/p Procedure(s): EXTERNAL FIXATION RIGHT LOWER LEG and ORIF w ex fix removal 9/27 I&D for distal medial wound infection 10/10  SUBJECTIVE: More appropriate and conversant this morning, pain controlled.  Patient had disconnect strip vac multiple times overnight and it was not running on entering room.  OBJECTIVE: PE:RLE -  Hinged brace in place  Dressing initially not on suction, holding suction well once restarted.  WWP foot, wiggles toes but weak TA function due to pain.  Vitals:   10/03/17 0500 10/03/17 0930  BP: 124/87 126/86  Pulse: (!) 108 (!) 121  Resp: 18 20  Temp: 98 F (36.7 C) 98.6 F (37 C)  SpO2: 100% 100%     ASSESSMENT: KAVON VALENZA is a 36 y.o. male with right tibia fracture sp Ex fix and ex fix removal and ORIF of tibia 9/27 and Right laterally based ligamentous injury of knee.   -I&D with incisional vac placement 10/10 - cultures sent.  PLAN: - Discussed with trauma team, patient and nurses the importance of keeping the dressing on suction.   -ID for antibiotics with plan for suppression while fracture heals and eventual removal of hardware 1+ years out after solid union of fracture.   -TDWB RLE  In boot and hinged brace unlocked. -Daily RN dressing changes addressing medial incision most carefully.  Discussed with new RN team today. -Elevate operative extremity as tolerated to minimize swelling - plan for 3 week postop follow up for suture removal
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
## 2017-10-03 NOTE — Evaluation (Signed)
Modified Barium Swallow Progress Note  Patient Details  Name: Dustin Marquez MRN: [id number] Date of Birth: 1981-10-08  Today's Date: 10/03/2017  Modified Barium Swallow completed.  Full report located under Chart Review in the Imaging Section.  Brief recommendations include the following:  Clinical Impression  Pt presents with mild oropharyngeal dysphagia characterized by premature spillage and a single instance of trace silent aspiration noted during the swallow with cup sips of thin liquid. When swallowing pill with thin liquid, delayed oral transit was noted and pill lodged in vallecula, unsensed by pt despite verbal question cues; cleared with bite of applesauce. Despite pt's continued impulsivity, he demonstrates ability to execute suggested precautions of small sips/bites and protect his airway during the swallow. Optimum timing of swallow and effective airway protection noted consistently when pt eats/drinks naturally without simultaneous cueing from SLP. Recommend upgrading to regular diet with thin liquids, meds whole in puree with full supervision to cue for small bites/sips and physical assist as needed. Will continue to monitor for diet tolerance.    Swallow Evaluation Recommendations       SLP Diet Recommendations: Regular solids;Thin liquid   Liquid Administration via: Cup;Straw   Medication Administration: Whole meds with puree   Supervision: Staff to assist with self feeding;Full supervision/cueing for compensatory strategies   Compensations: Minimize environmental distractions;Small sips/bites;Slow rate       Oral Care Recommendations: Oral care BID        Carmela Rima, Student SLP 10/03/2017,2:48 PM

## 2017-10-04 DIAGNOSIS — Z8781 Personal history of (healed) traumatic fracture: Secondary | ICD-10-CM

## 2017-10-04 DIAGNOSIS — Z4659 Encounter for fitting and adjustment of other gastrointestinal appliance and device: Secondary | ICD-10-CM

## 2017-10-04 LAB — VALPROIC ACID LEVEL: Valproic Acid Lvl: 53 ug/mL (ref 50.0–100.0)

## 2017-10-04 MED ORDER — SODIUM CHLORIDE 0.9 % IV SOLN
1.0000 g | Freq: Three times a day (TID) | INTRAVENOUS | Status: DC
  Administered 2017-10-04 – 2017-10-05 (×3): 1 g via INTRAVENOUS
  Filled 2017-10-04 (×4): qty 1

## 2017-10-04 NOTE — Progress Notes (Signed)
ORTHOPAEDIC PROGRESS NOTE  s/p Procedure(s): EXTERNAL FIXATION RIGHT LOWER LEG and ORIF w ex fix removal 9/27 I&D for distal medial wound infection 10/10  SUBJECTIVE: NAD, strip vac was again off overnight for an extended period.  Running currently.    OBJECTIVE: PE:RLE -  Hinged brace in place  Dressing holding suction.  WWP foot, wiggles toes but weak TA function due to pain.  Vitals:   10/04/17 0140 10/04/17 0553  BP: 110/75 95/67  Pulse: (!) 128 (!) 113  Resp: 18 18  Temp: 98.8 F (37.1 C) 99.1 F (37.3 C)  SpO2: 99% 100%     ASSESSMENT: Dustin Marquez is a 36 y.o. male with right tibia fracture sp Ex fix and ex fix removal and ORIF of tibia 9/27 and Right laterally based ligamentous injury of knee.   -I&D with incisional vac placement 10/10 - cultures sent.  PLAN: - Plan for vac till Monday, check wound then. -ID for antibiotics with plan for suppression while fracture heals and eventual removal of hardware 1+ years out after solid union of fracture.   -TDWB RLE  In boot and hinged brace unlocked. -Daily RN dressing changes addressing medial incision most carefully.  Discussed with new RN team today. -Elevate operative extremity as tolerated to minimize swelling - plan for 3 week postop follow up for suture removal
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
## 2017-10-04 NOTE — Progress Notes (Signed)
Received critical lab for tissue taken from pt's right ankle on 10/10, cultures growing pseudomonas. MD notified
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
## 2017-10-04 NOTE — Clinical Social Work Note (Signed)
Clinical Social Worker continuing to follow patient and family for support and discharge planning needs.  Patient remains in the [facility] bed, therefore not appropriate for SNF placement at this time.  CSW will continue to follow and facilitate patient discharge needs once medically stable.  Macario Golds, [place] [phone]

## 2017-10-04 NOTE — Progress Notes (Signed)
Patients wound vac has no power cord and the battery died, I obtained anther wound vac from portable and used the existing canister the wound vac canister had nothing in it, will continue to monitor.

## 2017-10-04 NOTE — Progress Notes (Signed)
Pharmacy Antibiotic Note  Dustin Marquez is a 36 y.o. male admitted on 09/06/2017 with pseudomonas in R ankle tissue culture, hardware-associated infection s/p I&D.  Pharmacy has been consulted for meropenem dosing. ID d/c'd Zyvox but will leave Levaquin in case unable to administer IV antibiotics and in case is susceptible. Afebrile, WBC 10.6, SCr stable 0.93.  Plan: Start meropenem 1g IV q8h Continue Levaquin  PO Qday per ID ID d/c'd Zyvox Monitor clinical progress, c/s, renal function, QTc with antipsychotic medications also F/u de-escalation plan/LOT   Height:  (177.8 cm) Weight: 126 lb 12.8 oz (57.5 kg) IBW/kg (Calculated) : 73  Temp (24hrs), Avg:98.8 F (37.1 C), Min:98.4 F (36.9 C), Max:99.1 F (37.3 C)   Recent Labs Lab 10/03/17 0723  WBC 10.6*  CREATININE 0.93    Estimated Creatinine Clearance: 89.3 mL/min (by C-G formula based on SCr of 0.93 mg/dL).    Allergies  Allergen Reactions  . Chlorhexidine Rash    CHG wipes in ICU caused rash that persisted until they were discontinued.  Ardeth Perfect [Lamotrigine] Rash    Babs Bertin, PharmD, BCPS Clinical Pharmacist Rx Phone # for today: [phone] After 3:30PM, please call Main Rx: [id number] 10/04/2017 2:58 PM

## 2017-10-05 LAB — CBC
HCT: 35.5 % — ABNORMAL LOW (ref 39.0–52.0)
Hemoglobin: 11.4 g/dL — ABNORMAL LOW (ref 13.0–17.0)
MCH: 28.7 pg (ref 26.0–34.0)
MCHC: 32.1 g/dL (ref 30.0–36.0)
MCV: 89.4 fL (ref 78.0–100.0)
PLATELETS: 333 10*3/uL (ref 150–400)
RBC: 3.97 MIL/uL — ABNORMAL LOW (ref 4.22–5.81)
RDW: 14.7 % (ref 11.5–15.5)
WBC: 6.5 10*3/uL (ref 4.0–10.5)

## 2017-10-05 MED ORDER — DEXTROSE 5 % IV SOLN
2.0000 g | Freq: Three times a day (TID) | INTRAVENOUS | Status: DC
  Administered 2017-10-05 – 2017-11-12 (×114): 2 g via INTRAVENOUS
  Filled 2017-10-05 (×120): qty 2

## 2017-10-05 MED ORDER — DIVALPROEX SODIUM ER 500 MG PO TB24
1000.0000 mg | ORAL_TABLET | Freq: Every day | ORAL | Status: DC
  Administered 2017-10-05 – 2017-10-13 (×9): 1000 mg via ORAL
  Filled 2017-10-05 (×10): qty 2

## 2017-10-05 NOTE — Progress Notes (Signed)
      INFECTIOUS DISEASE ATTENDING ADDENDUM:   Date: 10/05/2017  Patient name: Dustin Marquez  Medical record number: [id number]  Date of birth: 09-05-1981    The patient is growing pseudomonas aeruginosa from deep cultures associated with his hardware infection around fracture.Marland Kitchen  Unfortunately this organism has intermediate susceptibilities to fluoroquinolones. Therefore we need to treat him with parenteral antibiotics. I started him on meropenem yesterday but will change him to cefepime 2 g IV every 8 hours which is a fairly aggressive dose  I would recommend trying to treat him through 8 weeks with this antibiotic. Unfortunately there will not be an option to go to an oral antibiotic to prolong therapy further and further prolonged therapy is needed he will need to be continued parenteral form.  For now I like to plan for 8 weeks of IV antibiotics. This will likely need to be in the hospital and/or a highly skilled center such as potentially and [facility].   He will need to have weekly CBC with BMP and GFR monitored  Please call us back when he nears conclusion of therapy  Diagnosis: Hardware associated osteomyelitis  Culture Result: Pseudomonas aeruginosa  Allergies  Allergen Reactions  . Chlorhexidine Rash    CHG wipes in ICU caused rash that persisted until they were discontinued.  . Lamictal [Lamotrigine] Rash    OPAT Orders Discharge antibiotics:cefepime 2 g IV q 8 hours  Duration: 8 weeks  End Date:  December 6th   [place] Per Protocol (defer to team if they want to have PICC placed vs use PIVs)  Labs weekly while on IV antibiotics: x__ CBC with differential _x_ BMP  _x_ CRP x__ ESR  _x_ Please pull PIC at completion of IV antibiotics __ Please leave PIC in place until doctor has seen patient or been notified  Fax weekly labs to [phone]  Clinic Follow Up Appt:  I suspect he will still be in a facility but IF he can be seen in clinic  please call Lyda Jester at [phone] to arrange HSFU in our clinic and ask for 30 minute spot.  Rhina Brackett [place] 10/05/2017, 2:11 PM
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
## 2017-10-06 NOTE — Progress Notes (Signed)
[facility] Progress Note  4 Days Post-Op  Subjective: CC: no complaints RLE pain is well controlled. Wants an ensure, tolerating diet. Wanting to know when he'll be able to get out of the hospital  Objective: Vital signs in last 24 hours: Temp:  [98.5 F (36.9 C)-99.1 F (37.3 C)] 98.5 F (36.9 C) (10/14 0947) Pulse Rate:  [92-116] 92 (10/14 0947) Resp:  [18-19] 18 (10/14 0947) BP: (101-117)/(71-85) 105/72 (10/14 0947) SpO2:  [97 %-100 %] 100 % (10/14 0947) Last BM Date: 10/04/17  Intake/Output from previous day: 10/13 0701 - 10/14 0700 In: 817 [P.O.:717; IV Piggyback:100] Out: 500 [Blood:500] Intake/Output this shift: No intake/output data recorded.  PE: Gen: NAD, cooperative Card: RRR, pedal pulses 2+ BL Pulm: Normal effort, CTAB Abd: Soft, non-tender Skin: warm and dry, no rashes ; small abrasion to forehead without erythema or drainage Psych: unable to assess Ext: Incisional VAC over RLE, hinged brace in place. AROM toes in tact; no edema, warmth, or deformity BUE.  Lab Results:   Recent Labs  10/05/17 0537  WBC 6.5  HGB 11.4*  HCT 35.5*  PLT 333   BMET No results for input(s): NA, K, CL, CO2, GLUCOSE, BUN, CREATININE, CALCIUM in the last 72 hours. PT/INR No results for input(s): LABPROT, INR in the last 72 hours. CMP     Component Value Date/Time   NA 137 10/03/2017 0723   K 3.8 10/03/2017 0723   CL 99 (L) 10/03/2017 0723   CO2 31 10/03/2017 0723   GLUCOSE 101 (H) 10/03/2017 0723   BUN 13 10/03/2017 0723   CREATININE 0.93 10/03/2017 0723   CALCIUM 9.2 10/03/2017 0723   PROT 5.7 (L) 09/09/2017 0143   ALBUMIN 2.7 (L) 09/09/2017 0143   AST 86 (H) 09/09/2017 0143   ALT 115 (H) 09/09/2017 0143   ALKPHOS 36 (L) 09/09/2017 0143   BILITOT 1.5 (H) 09/09/2017 0143   GFRNONAA >60 10/03/2017 0723   GFRAA >60 10/03/2017 0723   Lipase  No results found for: LIPASE     Studies/Results: No results  found.  Anti-infectives: Anti-infectives    Start     Dose/Rate Route Frequency Ordered Stop   10/05/17 1415  ceFEPIme (MAXIPIME) 2 g in dextrose 5 % 50 mL IVPB     2 g 100 mL/hr over 30 Minutes Intravenous Every 8 hours 10/05/17 1411     10/04/17 1500  meropenem (MERREM) 1 g in sodium chloride 0.9 % 100 mL IVPB  Status:  Discontinued     1 g 200 mL/hr over 30 Minutes Intravenous Every 8 hours 10/04/17 1453 10/05/17 1411   10/02/17 1700  linezolid (ZYVOX) tablet 600 mg  Status:  Discontinued     600 mg Oral Every 12 hours 10/02/17 1532 10/04/17 1440   10/02/17 1600  levofloxacin (LEVAQUIN) tablet 750 mg  Status:  Discontinued     750 mg Oral Daily 10/02/17 1532 10/05/17 1111   10/02/17 1530  linezolid (ZYVOX) tablet 600 mg  Status:  Discontinued     600 mg Oral Every 12 hours 10/02/17 1529 10/02/17 1532   10/02/17 1148  tobramycin (NEBCIN) powder  Status:  Discontinued       As needed 10/02/17 1148 10/02/17 1215   10/02/17 1148  vancomycin (VANCOCIN) powder  Status:  Discontinued       As needed 10/02/17 1148 10/02/17 1215   10/02/17 1030  vancomycin (VANCOCIN) IVPB 1000 mg/200 mL premix  Status:  Discontinued     1,000 mg 200  mL/hr over 60 Minutes Intravenous To Surgery 10/02/17 0820 10/02/17 1133   10/01/17 1200  cephALEXin (KEFLEX) capsule 500 mg  Status:  Discontinued     500 mg Oral Every 6 hours 10/01/17 0933 10/02/17 1532   09/30/17 1800  cephALEXin (KEFLEX) capsule 500 mg  Status:  Discontinued     500 mg Oral Every 6 hours 09/30/17 1018 10/01/17 0746   09/29/17 2200  cephALEXin (KEFLEX) capsule 500 mg  Status:  Discontinued     500 mg Oral 2 times daily 09/29/17 1407 09/30/17 1018   09/29/17 1415  cephALEXin (KEFLEX) capsule 500 mg  Status:  Discontinued     500 mg Oral 2 times daily 09/29/17 1402 09/29/17 1407   09/27/17 1130  ciprofloxacin (CIPRO) tablet 500 mg  Status:  Discontinued     500 mg Oral 2 times daily 09/27/17 1050 09/29/17 1402   09/27/17 0800  cefTRIAXone  (ROCEPHIN) 1 g in dextrose 5 % 50 mL IVPB  Status:  Discontinued    Comments:  Pharmacy may adjust dosing strength / duration / interval for maximal efficacy   1 g 100 mL/hr over 30 Minutes Intravenous Every 24 hours 09/27/17 0756 09/27/17 1050   09/19/17 1430  ceFAZolin (ANCEF) IVPB 2g/100 mL premix     2 g 200 mL/hr over 30 Minutes Intravenous Every 8 hours 09/19/17 1428 09/20/17 0548   09/19/17 1229  vancomycin (VANCOCIN) powder  Status:  Discontinued       As needed 09/19/17 1229 09/19/17 1239   09/19/17 1000  ceFAZolin (ANCEF) IVPB 2 g/50 mL premix     2 g 100 mL/hr over 30 Minutes Intravenous  Once 09/19/17 0947 09/19/17 1026   09/19/17 0946  ceFAZolin (ANCEF) 2-4 GM/100ML-% IVPB    Comments:  [name], [name]   : cabinet override      09/19/17 0946 09/19/17 2159   09/09/17 2100  ceFAZolin (ANCEF) IVPB 1 g/50 mL premix     1 g 100 mL/hr over 30 Minutes Intravenous Every 8 hours 09/09/17 1548 09/10/17 1610       Assessment/Plan 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 Dual antiplatelet TX per Stroke Service.  L first rib FX with tiny occult PTX- PTX resolved, pain control, IS  R elbow abrasions- local wound care R tib fib FX- s/p ORIF 9/27 Dr. Everardo Pacific - Hardware became infected and pt taken back to OR 10/10 for I&D, complex closure, VAC placement  - TDWB RLE in boot and unlocked hinged knee brace; elevate as able - 3 week f/u for suture removal  - Appreciate ID (Dr. Zenaida Niece Dam)consult for antibiotic regimen - IV cefepime Agitation- scheduled seroquel, ativan q4h prn, [place] bed Hx of Bipolar disorder- psych consulted, restarted home depakote  500 mg PO QHS, can be titrated to 1000 mg PO QHS - valproic acid 53 10/12, increased depakote to 1000 qhs 10/13 - recheck level towards the end of the week UTI - insufficient growth;ABX stopped 10/7 FEN- reg  ID- ancef 10/5, keflex 10/7; meropenem 10/12>10/13; cefepime 10/13>>> VTE- ASA 325 mg, plavix 75 mg; SCD LLE   Dispo- [place] bed, floor. May be able to start transitioning out of  the [place] bed this week. SNF when out of [place] bed.   LOS: 30 days    Wells Guiles , [facility] Surgery 10/06/2017, 10:40 AM Pager: [phone] Trauma Pager: [phone] Mon-Fri 7:00 am-4:30 pm Sat-Sun 7:00 am-11:30 am

## 2017-10-06 NOTE — Progress Notes (Signed)
Pt care assumed at from 1900-2300 and pt is noted to be resting quietly within the enclosure bed apparatus and free from injury , harm. Pt is alert and able to make needs known. Aspen collar remains in place. Refused hydrocortisone cream then requested its use topically for his arms./ without any noted hallucinations during this time of monitoring. Skin intact./ continent of b./d. Pt states that BM was 10/05/17./ wound vac continues at continuous therapy on right ankle with black brace noted. Pt is educated to make sure that his left foot remains free of the cord to prevent disconnection of the wound vac tubing./ safety maintained. callbell within reach./ will continue to monitor.

## 2017-10-07 LAB — AEROBIC/ANAEROBIC CULTURE W GRAM STAIN (SURGICAL/DEEP WOUND)

## 2017-10-07 LAB — AEROBIC/ANAEROBIC CULTURE (SURGICAL/DEEP WOUND)

## 2017-10-07 MED ORDER — DIPHENHYDRAMINE HCL 25 MG PO CAPS
25.0000 mg | ORAL_CAPSULE | Freq: Four times a day (QID) | ORAL | Status: DC | PRN
  Administered 2017-10-09 – 2017-11-10 (×18): 25 mg via ORAL
  Filled 2017-10-07 (×18): qty 1
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
## 2017-10-07 NOTE — Progress Notes (Signed)
[facility] Progress Note  5 Days Post-Op  Subjective: CC:  RLE pain significantly improved. Patient eager to get out of vail bed.   Objective: Vital signs in last 24 hours: Temp:  [97.7 F (36.5 C)-99.3 F (37.4 C)] 98.7 F (37.1 C) (10/15 0639) Pulse Rate:  [92-114] 98 (10/15 0639) Resp:  [16-18] 18 (10/15 0639) BP: (100-117)/(61-80) 116/71 (10/15 0639) SpO2:  [99 %-100 %] 99 % (10/15 0639) Last BM Date: 10/04/17  Intake/Output from previous day: 10/14 0701 - 10/15 0700 In: 270 [P.O.:120; IV Piggyback:150] Out: 750 [Urine:750] Intake/Output this shift: No intake/output data recorded.  PE: Gen: NAD, cooperative Card: RRR, pedal pulses 2+ BL Pulm: Normal effort, CTAB Abd: Soft, non-tender Skin: warm and dry, no rashes ; small abrasion to forehead without erythema or drainage Psych: unable to assess Ext: Incisional VAC over RLE, hinged brace in place. AROM toes in tact; no edema, warmth, or deformity BUE.   Lab Results:   Recent Labs  10/05/17 0537  WBC 6.5  HGB 11.4*  HCT 35.5*  PLT 333   BMET No results for input(s): NA, K, CL, CO2, GLUCOSE, BUN, CREATININE, CALCIUM in the last 72 hours. PT/INR No results for input(s): LABPROT, INR in the last 72 hours. CMP     Component Value Date/Time   NA 137 10/03/2017 0723   K 3.8 10/03/2017 0723   CL 99 (L) 10/03/2017 0723   CO2 31 10/03/2017 0723   GLUCOSE 101 (H) 10/03/2017 0723   BUN 13 10/03/2017 0723   CREATININE 0.93 10/03/2017 0723   CALCIUM 9.2 10/03/2017 0723   PROT 5.7 (L) 09/09/2017 0143   ALBUMIN 2.7 (L) 09/09/2017 0143   AST 86 (H) 09/09/2017 0143   ALT 115 (H) 09/09/2017 0143   ALKPHOS 36 (L) 09/09/2017 0143   BILITOT 1.5 (H) 09/09/2017 0143   GFRNONAA >60 10/03/2017 0723   GFRAA >60 10/03/2017 0723   Lipase  No results found for: LIPASE     Studies/Results: No results found.  Anti-infectives: Anti-infectives    Start     Dose/Rate Route Frequency Ordered Stop   10/05/17 1415  ceFEPIme (MAXIPIME) 2 g in dextrose 5 % 50 mL IVPB     2 g 100 mL/hr over 30 Minutes Intravenous Every 8 hours 10/05/17 1411     10/04/17 1500  meropenem (MERREM) 1 g in sodium chloride 0.9 % 100 mL IVPB  Status:  Discontinued     1 g 200 mL/hr over 30 Minutes Intravenous Every 8 hours 10/04/17 1453 10/05/17 1411   10/02/17 1700  linezolid (ZYVOX) tablet 600 mg  Status:  Discontinued     600 mg Oral Every 12 hours 10/02/17 1532 10/04/17 1440   10/02/17 1600  levofloxacin (LEVAQUIN) tablet 750 mg  Status:  Discontinued     750 mg Oral Daily 10/02/17 1532 10/05/17 1111   10/02/17 1530  linezolid (ZYVOX) tablet 600 mg  Status:  Discontinued     600 mg Oral Every 12 hours 10/02/17 1529 10/02/17 1532   10/02/17 1148  tobramycin (NEBCIN) powder  Status:  Discontinued       As needed 10/02/17 1148 10/02/17 1215   10/02/17 1148  vancomycin (VANCOCIN) powder  Status:  Discontinued       As needed 10/02/17 1148 10/02/17 1215   10/02/17 1030  vancomycin (VANCOCIN) IVPB 1000 mg/200 mL premix  Status:  Discontinued     1,000 mg 200 mL/hr over 60 Minutes Intravenous To Surgery 10/02/17 0820 10/02/17 1133  10/01/17 1200  cephALEXin (KEFLEX) capsule 500 mg  Status:  Discontinued     500 mg Oral Every 6 hours 10/01/17 0933 10/02/17 1532   09/30/17 1800  cephALEXin (KEFLEX) capsule 500 mg  Status:  Discontinued     500 mg Oral Every 6 hours 09/30/17 1018 10/01/17 0746   09/29/17 2200  cephALEXin (KEFLEX) capsule 500 mg  Status:  Discontinued     500 mg Oral 2 times daily 09/29/17 1407 09/30/17 1018   09/29/17 1415  cephALEXin (KEFLEX) capsule 500 mg  Status:  Discontinued     500 mg Oral 2 times daily 09/29/17 1402 09/29/17 1407   09/27/17 1130  ciprofloxacin (CIPRO) tablet 500 mg  Status:  Discontinued     500 mg Oral 2 times daily 09/27/17 1050 09/29/17 1402   09/27/17 0800  cefTRIAXone (ROCEPHIN) 1 g in dextrose 5 % 50 mL IVPB  Status:  Discontinued    Comments:  Pharmacy may adjust  dosing strength / duration / interval for maximal efficacy   1 g 100 mL/hr over 30 Minutes Intravenous Every 24 hours 09/27/17 0756 09/27/17 1050   09/19/17 1430  ceFAZolin (ANCEF) IVPB 2g/100 mL premix     2 g 200 mL/hr over 30 Minutes Intravenous Every 8 hours 09/19/17 1428 09/20/17 0548   09/19/17 1229  vancomycin (VANCOCIN) powder  Status:  Discontinued       As needed 09/19/17 1229 09/19/17 1239   09/19/17 1000  ceFAZolin (ANCEF) IVPB 2 g/50 mL premix     2 g 100 mL/hr over 30 Minutes Intravenous  Once 09/19/17 0947 09/19/17 1026   09/19/17 0946  ceFAZolin (ANCEF) 2-4 GM/100ML-% IVPB    Comments:  [name], [name]   : cabinet override      09/19/17 0946 09/19/17 2159   09/09/17 2100  ceFAZolin (ANCEF) IVPB 1 g/50 mL premix     1 g 100 mL/hr over 30 Minutes Intravenous Every 8 hours 09/09/17 1548 09/10/17 1610     Assessment/Plan 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 Dual antiplatelet TX per Stroke Service.  L first rib FX with tiny occult PTX- PTX resolved, pain control, IS  R elbow abrasions- local wound care R tib fib FX- s/p ORIF 9/27 Dr. Everardo Pacific - Hardware became infected and pt taken back to OR 10/10 for I&D, complex closure, VAC placement  - TDWB RLE in boot and unlocked hinged knee brace; elevate as able - 3 week f/u for suture removal  - Appreciate ID (Dr. Zenaida Niece Dam)consult for antibiotic regimen - surgical CX pseudomonas aeruginosa; IV cefepime Agitation- scheduled seroquel, ativan q4h prn, vail bed Hx of Bipolar disorder- psych consulted, restarted home depakote 500 mg PO QHS, can be titrated to 1000 mg PO QHS - valproic acid  53 10/12, increased depakote to 1000 qhs 10/13 - recheck level towards the end of the week  UTI - insufficient growth;ABX stopped 10/7 FEN- reg  ID- ancef 10/5, keflex 10/7; meropenem 10/12>10/13; cefepime 10/13>>  VTE- ASA 325 mg, plavix 75 mg; SCD LLE   Dispo- Vail bed, floor.  SNF when out of vail bed.    LOS: 31 days    Lanora Manis  Langley Adie , [facility] 10/07/2017, 8:18 AM Pager: [phone] Consults: [phone] Mon-Fri 7:00 am-4:30 pm Sat-Sun 7:00 am-11:30 am

## 2017-10-08 MED ORDER — SODIUM CHLORIDE 0.9% FLUSH
10.0000 mL | INTRAVENOUS | Status: DC | PRN
  Administered 2017-10-11 – 2017-10-15 (×3): 10 mL
  Administered 2017-10-17 – 2017-10-26 (×2): 20 mL
  Administered 2017-11-12: 10 mL
  Filled 2017-10-08 (×6): qty 40

## 2017-10-08 MED ORDER — CEFEPIME IV (FOR PTA / DISCHARGE USE ONLY): 2.0000 g | Freq: Three times a day (TID) | INTRAVENOUS | 0 refills | Status: DC

## 2017-10-08 NOTE — Progress Notes (Signed)
Went to patient room to place PICC line. Patient on Enclosure bed. PICC unable to be done at bedside due to sterile procedure, RN and PT at bedside. Floor RN to call MD, will follow up.

## 2017-10-08 NOTE — Progress Notes (Signed)
PHARMACY CONSULT NOTE FOR:  OUTPATIENT  PARENTERAL ANTIBIOTIC THERAPY (OPAT)  Indication: Hardware associated osteomyelitis Regimen: cefepime 2gm IV q8h End date: 11/28/2017  IV antibiotic discharge orders are pended. To discharging provider:  please sign these orders via discharge navigator,  Select New Orders & click on the button choice - Manage This Unsigned Work.     Thank you for allowing pharmacy to be a part of this patient's care.  Harland German, Pharm D 10/08/2017 11:27 AM

## 2017-10-08 NOTE — Progress Notes (Signed)
Patient ID: Dustin Marquez, male   DOB: 1981-09-17, 36 y.o.   MRN: [id number] He did much better with therapies today and they recommend D/C V[place] bed. Violeta Gelinas, MD, MPH, FACS Trauma: [phone] General Surgery: [phone]
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
## 2017-10-08 NOTE — Progress Notes (Signed)
ORTHOPAEDIC PROGRESS NOTE  s/p Procedure(s): EXTERNAL FIXATION RIGHT LOWER LEG and ORIF w ex fix removal 9/27 I&D for distal medial wound infection 10/10  SUBJECTIVE: NAD, feels vac is uncomfortable.  OBJECTIVE: PE:RLE -  Hinged brace in place  Incisions much improved. No drainage actively. WWP foot, wiggles toes but weak TA function due to pain.  Vitals:   10/08/17 0630 10/08/17 1012  BP: 115/74 114/73  Pulse: (!) 106 (!) 105  Resp: 16 16  Temp: 98.5 F (36.9 C) 98.7 F (37.1 C)  SpO2: 99% 99%     ASSESSMENT: Dustin Marquez is a 36 y.o. male with right tibia fracture sp Ex fix and ex fix removal and ORIF of tibia 9/27 and Right laterally based ligamentous injury of knee.   -I&D with incisional vac placement 10/10 - cultures sent.  PLAN: - Dry dressing changes daily until no drainage. -TDWB RLE  In boot and hinged brace unlocked. -Elevate operative extremity as tolerated to minimize swelling - plan for 3 week postop follow up for suture removal
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
## 2017-10-08 NOTE — Progress Notes (Signed)
Per IV Team ,PICC unable to be inserted due to structure of POSEY bed at this time. Patient noted more cooperative and understanding. Pt able to sit in chair and did not attempted to get up by himself. Dr. Janee Morn order to discontinued Posey Restraints at this time.    Sim Boast, RN

## 2017-10-08 NOTE — Progress Notes (Signed)
Peripherally Inserted Central Catheter/Midline Placement  The IV Nurse has discussed with the patient and/or persons authorized to consent for the patient, the purpose of this procedure and the potential benefits and risks involved with this procedure.  The benefits include less needle sticks, lab draws from the catheter, and the patient may be discharged home with the catheter. Risks include, but not limited to, infection, bleeding, blood clot (thrombus formation), and puncture of an artery; nerve damage and irregular heartbeat and possibility to perform a PICC exchange if needed/ordered by physician.  Alternatives to this procedure were also discussed.  Bard Power PICC patient education guide, fact sheet on infection prevention and patient information card has been provided to patient /or left at bedside.    PICC/Midline Placement Documentation        Dustin Marquez 10/08/2017, 6:15 PM

## 2017-10-09 MED ORDER — BUPIVACAINE-EPINEPHRINE (PF) 0.25% -1:200000 IJ SOLN
INTRAMUSCULAR | Status: AC
  Filled 2017-10-09: qty 30

## 2017-10-09 MED ORDER — ASPIRIN 81 MG PO CHEW
81.0000 mg | CHEWABLE_TABLET | Freq: Every day | ORAL | Status: DC
  Administered 2017-10-10 – 2017-11-12 (×34): 81 mg via ORAL
  Filled 2017-10-09 (×34): qty 1

## 2017-10-09 NOTE — Progress Notes (Signed)
Dressing changed per order
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
## 2017-10-09 NOTE — Clinical Social Work Note (Signed)
Clinical Social Worker continuing to follow patient and family for support and discharge planning needs.  CSW spoke with patient who is now out of the [facility] bed and doing quite well.  Patient is agreeable with placement.  CSW has spoken with CSW ChiropodistAssistant Director who plans to further proceed with Wellsite geologistmedical director to determine plan of care moving forward.  CSW remains available for support and to facilitate patient discharge needs.  Dustin GoldsJesse Eleena Grater, Kentucky[place]CS[place] [phone]
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
## 2017-10-10 MED ORDER — OXYCODONE HCL 5 MG PO TABS
5.0000 mg | ORAL_TABLET | ORAL | Status: DC | PRN
Start: 2017-10-10 — End: 2017-10-12
  Administered 2017-10-10 – 2017-10-12 (×9): 5 mg via ORAL
  Filled 2017-10-10 (×10): qty 1

## 2017-10-10 MED ORDER — FENTANYL CITRATE (PF) 100 MCG/2ML IJ SOLN: 50.0000 ug | Freq: Four times a day (QID) | INTRAMUSCULAR | Status: DC | PRN

## 2017-10-10 MED ORDER — ACETAMINOPHEN 500 MG PO TABS
1000.0000 mg | ORAL_TABLET | Freq: Four times a day (QID) | ORAL | Status: DC
  Administered 2017-10-10 – 2017-10-14 (×15): 1000 mg via ORAL
  Filled 2017-10-10 (×18): qty 2

## 2017-10-10 MED ORDER — HYDROMORPHONE HCL 1 MG/ML IJ SOLN: 0.5000 mg | INTRAMUSCULAR | Status: DC | PRN

## 2017-10-10 NOTE — Progress Notes (Signed)
[facility] Progress Note  8 Days Post-Op  Subjective: CC:  RLE pain controlled - reports itching under brace. No new complaints. Tolerating PO. Having bowel and bladder  function  Objective: Vital signs in last 24 hours: Temp:  [98 F (36.7 C)-99.5 F (37.5 C)] 98.7 F (37.1 C) (10/18 0440) Pulse Rate:  [93-110] 93 (10/18 0440) Resp:  [16-18] 18 (10/18 0440) BP: (102-127)/(67-83) 120/83 (10/18 0440) SpO2:  [98 %-100 %] 99 % (10/18 0440) Last BM Date: 10/08/17  Intake/Output from previous day: 10/17 0701 - 10/18 0700 In: 600 [P.O.:600] Out: 3100 [Urine:3100] Intake/Output this shift: Total I/O In: 240 [P.O.:240] Out: 400 [Urine:400]  PE: Gen: NAD, cooperative Card: RRR, pedal pulses 2+ BL Pulm: Normal effort, CTAB Abd: Soft, non-tender Skin: warm and dry, no rashes Psych: unable to assess Ext: Hinged brace in place over RLE. AROM toes in tact; no edema, warmth, or deformity BUE.   Lab Results:  No results for input(s): WBC, HGB, HCT, PLT in the last 72 hours. BMET No results for input(s): NA, K, CL, CO2, GLUCOSE, BUN, CREATININE, CALCIUM in the last 72 hours. PT/INR No results for input(s): LABPROT, INR in the last 72 hours. CMP     Component Value Date/Time   NA 137 10/03/2017 0723   K 3.8 10/03/2017 0723   CL 99 (L) 10/03/2017 0723   CO2 31 10/03/2017 0723   GLUCOSE 101 (H) 10/03/2017 0723   BUN 13 10/03/2017 0723   CREATININE 0.93 10/03/2017 0723   CALCIUM 9.2 10/03/2017 0723   PROT 5.7 (L) 09/09/2017 0143   ALBUMIN 2.7 (L) 09/09/2017 0143   AST 86 (H) 09/09/2017 0143   ALT 115 (H) 09/09/2017 0143   ALKPHOS 36 (L) 09/09/2017 0143   BILITOT 1.5 (H) 09/09/2017 0143   GFRNONAA >60 10/03/2017 0723   GFRAA >60 10/03/2017 0723   Lipase  No results found for: LIPASE     Studies/Results: No results found.  Anti-infectives: Anti-infectives    Start     Dose/Rate Route Frequency Ordered Stop   10/08/17 0000  ceFEPime (MAXIPIME) IVPB      2 g Intravenous Every 8 hours 10/08/17 1131     10/05/17 1415  ceFEPIme (MAXIPIME) 2 g in dextrose 5 % 50 mL IVPB     2 g 100 mL/hr over 30 Minutes Intravenous Every 8 hours 10/05/17 1411     10/04/17 1500  meropenem (MERREM) 1 g in sodium chloride 0.9 % 100 mL IVPB  Status:  Discontinued     1 g 200 mL/hr over 30 Minutes Intravenous Every 8 hours 10/04/17 1453 10/05/17 1411   10/02/17 1700  linezolid (ZYVOX) tablet 600 mg  Status:  Discontinued     600 mg Oral Every 12 hours 10/02/17 1532 10/04/17 1440   10/02/17 1600  levofloxacin (LEVAQUIN) tablet 750 mg  Status:  Discontinued     750 mg Oral Daily 10/02/17 1532 10/05/17 1111   10/02/17 1530  linezolid (ZYVOX) tablet 600 mg  Status:  Discontinued     600 mg Oral Every 12 hours 10/02/17 1529 10/02/17 1532   10/02/17 1148  tobramycin (NEBCIN) powder  Status:  Discontinued       As needed 10/02/17 1148 10/02/17 1215   10/02/17 1148  vancomycin (VANCOCIN) powder  Status:  Discontinued       As needed 10/02/17 1148 10/02/17 1215   10/02/17 1030  vancomycin (VANCOCIN) IVPB 1000 mg/200 mL premix  Status:  Discontinued     1,000  mg 200 mL/hr over 60 Minutes Intravenous To Surgery 10/02/17 0820 10/02/17 1133   10/01/17 1200  cephALEXin (KEFLEX) capsule 500 mg  Status:  Discontinued     500 mg Oral Every 6 hours 10/01/17 0933 10/02/17 1532   09/30/17 1800  cephALEXin (KEFLEX) capsule 500 mg  Status:  Discontinued     500 mg Oral Every 6 hours 09/30/17 1018 10/01/17 0746   09/29/17 2200  cephALEXin (KEFLEX) capsule 500 mg  Status:  Discontinued     500 mg Oral 2 times daily 09/29/17 1407 09/30/17 1018   09/29/17 1415  cephALEXin (KEFLEX) capsule 500 mg  Status:  Discontinued     500 mg Oral 2 times daily 09/29/17 1402 09/29/17 1407   09/27/17 1130  ciprofloxacin (CIPRO) tablet 500 mg  Status:  Discontinued     500 mg Oral 2 times daily 09/27/17 1050 09/29/17 1402   09/27/17 0800  cefTRIAXone (ROCEPHIN) 1 g in dextrose 5 % 50 mL IVPB   Status:  Discontinued    Comments:  Pharmacy may adjust dosing strength / duration / interval for maximal efficacy   1 g 100 mL/hr over 30 Minutes Intravenous Every 24 hours 09/27/17 0756 09/27/17 1050   09/19/17 1430  ceFAZolin (ANCEF) IVPB 2g/100 mL premix     2 g 200 mL/hr over 30 Minutes Intravenous Every 8 hours 09/19/17 1428 09/20/17 0548   09/19/17 1229  vancomycin (VANCOCIN) powder  Status:  Discontinued       As needed 09/19/17 1229 09/19/17 1239   09/19/17 1000  ceFAZolin (ANCEF) IVPB 2 g/50 mL premix     2 g 100 mL/hr over 30 Minutes Intravenous  Once 09/19/17 0947 09/19/17 1026   09/19/17 0946  ceFAZolin (ANCEF) 2-4 GM/100ML-% IVPB    Comments:  [name], [name]   : cabinet override      09/19/17 0946 09/19/17 2159   09/09/17 2100  ceFAZolin (ANCEF) IVPB 1 g/50 mL premix     1 g 100 mL/hr over 30 Minutes Intravenous Every 8 hours 09/09/17 1548 09/10/17 1610     Assessment/Plan 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 Dual antiplatelet TX per Stroke Service.  L first rib FX with tiny occult PTX- PTX resolved, pain control, IS  R elbow abrasions- local wound care R tib fib FX- s/p ORIF 9/27 Dr. Everardo Pacific - Hardware became infected and pt taken back to OR 10/10 for I&D, complex closure, VAC placement; VAC D/C-ed and now dry dressing changes per ortho - TDWB RLE in boot and unlocked hinged knee brace; elevate as able - 3 week f/u for suture removal (~10/23/17) - Appreciate ID (Dr. Zenaida Niece Dam)consult for antibiotic regimen - surgical CX pseudomonas aeruginosa; IV cefepime x 8 weeks. PICC placed 10/16. Agitation- scheduled seroquel, ativan q4h  prn, [place] bed Hx of Bipolar disorder- psych consulted, restarted home depakote 500 mg PO QHS, can be titrated to 1000 mg PO QHS - valproic acid 53 10/12, increased depakote to 1000 qhs 10/13 - recheck level towards the end of the week UTI - insufficient growth;ABX stopped 10/7 FEN- reg  ID- ancef 10/5, keflex 10/7; meropenem 10/12>10/13; cefepime 10/13>> VTE- ASA 325 mg, plavix  75 mg; SCD LLE   Dispo- decrease IV fentanyl and increase PO pain control.  Medically stable for discharge to SNF. Appreciate LCSW facilitating this. Patient will be traditional LOG vs difficult to place.  Will check valproic acid level in AM since we increased daily dose 10/13    LOS: 34 days    Adam PhenixElizabeth S Kaspian Muccio , [facility] [address] [facility] 10/10/2017, 8:47 AM Pager: [phone] Consults: [phone] Mon-Fri 7:00 am-4:30 pm Sat-Sun 7:00 am-11:30 am
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
## 2017-10-11 LAB — VALPROIC ACID LEVEL: Valproic Acid Lvl: 32 ug/mL — ABNORMAL LOW (ref 50.0–100.0)

## 2017-10-11 MED ORDER — QUETIAPINE FUMARATE 50 MG PO TABS
50.0000 mg | ORAL_TABLET | Freq: Three times a day (TID) | ORAL | Status: DC
  Administered 2017-10-11 – 2017-10-13 (×9): 50 mg via ORAL
  Filled 2017-10-11 (×9): qty 1
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
## 2017-10-11 NOTE — Progress Notes (Signed)
OT Note - Addendum    10/11/17 1648  OT Visit Information  Last OT Received On 10/11/17  OT Time Calculation  OT Start Time (ACUTE ONLY) 1431  OT Stop Time (ACUTE ONLY) 1500  OT Time Calculation (min) 29 min  OT General Charges  $OT Visit 1 Visit  OT Treatments  $Self Care/Home Management  23-37 mins   [facility] Jauna Raczynski, OT/L  [phone] 10/11/2017

## 2017-10-12 LAB — VALPROIC ACID LEVEL: Valproic Acid Lvl: 34 ug/mL — ABNORMAL LOW (ref 50.0–100.0)

## 2017-10-12 LAB — BASIC METABOLIC PANEL
Anion gap: 7 (ref 5–15)
BUN: 25 mg/dL — AB (ref 6–20)
CHLORIDE: 99 mmol/L — AB (ref 101–111)
CO2: 29 mmol/L (ref 22–32)
CREATININE: 0.85 mg/dL (ref 0.61–1.24)
Calcium: 8.8 mg/dL — ABNORMAL LOW (ref 8.9–10.3)
GFR calc Af Amer: >60 mL/min (ref >60)
GFR calc non Af Amer: >60 mL/min (ref >60)
GLUCOSE: 103 mg/dL — AB (ref 65–99)
Potassium: 4.4 mmol/L (ref 3.5–5.1)
Sodium: 135 mmol/L (ref 135–145)

## 2017-10-12 LAB — CBC
HEMATOCRIT: 34.8 % — AB (ref 39.0–52.0)
HEMOGLOBIN: 11.3 g/dL — AB (ref 13.0–17.0)
MCH: 28.8 pg (ref 26.0–34.0)
MCHC: 32.5 g/dL (ref 30.0–36.0)
MCV: 88.5 fL (ref 78.0–100.0)
Platelets: 293 10*3/uL (ref 150–400)
RBC: 3.93 MIL/uL — ABNORMAL LOW (ref 4.22–5.81)
RDW: 15.9 % — ABNORMAL HIGH (ref 11.5–15.5)
WBC: 8.6 10*3/uL (ref 4.0–10.5)

## 2017-10-12 MED ORDER — OXYCODONE HCL 5 MG PO TABS
5.0000 mg | ORAL_TABLET | ORAL | Status: DC | PRN
  Administered 2017-10-12: 10 mg via ORAL
  Administered 2017-10-12: 5 mg via ORAL
  Administered 2017-10-12 – 2017-10-14 (×5): 10 mg via ORAL
  Filled 2017-10-12 (×7): qty 2
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
## 2017-10-13 LAB — CBC
HCT: 33.4 % — ABNORMAL LOW (ref 39.0–52.0)
Hemoglobin: 10.8 g/dL — ABNORMAL LOW (ref 13.0–17.0)
MCH: 28.8 pg (ref 26.0–34.0)
MCHC: 32.3 g/dL (ref 30.0–36.0)
MCV: 89.1 fL (ref 78.0–100.0)
PLATELETS: 271 10*3/uL (ref 150–400)
RBC: 3.75 MIL/uL — ABNORMAL LOW (ref 4.22–5.81)
RDW: 16.5 % — AB (ref 11.5–15.5)
WBC: 7.5 10*3/uL (ref 4.0–10.5)

## 2017-10-13 LAB — BASIC METABOLIC PANEL
Anion gap: 6 (ref 5–15)
BUN: 28 mg/dL — AB (ref 6–20)
CALCIUM: 8.8 mg/dL — AB (ref 8.9–10.3)
CO2: 29 mmol/L (ref 22–32)
Chloride: 103 mmol/L (ref 101–111)
Creatinine, Ser: 0.88 mg/dL (ref 0.61–1.24)
GFR calc Af Amer: >60 mL/min (ref >60)
GLUCOSE: 110 mg/dL — AB (ref 65–99)
Potassium: 4.2 mmol/L (ref 3.5–5.1)
SODIUM: 138 mmol/L (ref 135–145)
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
## 2017-10-14 DIAGNOSIS — F319 Bipolar disorder, unspecified: Secondary | ICD-10-CM

## 2017-10-14 LAB — CBC
HEMATOCRIT: 33.1 % — AB (ref 39.0–52.0)
Hemoglobin: 10.7 g/dL — ABNORMAL LOW (ref 13.0–17.0)
MCH: 28.8 pg (ref 26.0–34.0)
MCHC: 32.3 g/dL (ref 30.0–36.0)
MCV: 89.2 fL (ref 78.0–100.0)
PLATELETS: 267 10*3/uL (ref 150–400)
RBC: 3.71 MIL/uL — AB (ref 4.22–5.81)
RDW: 16.3 % — ABNORMAL HIGH (ref 11.5–15.5)
WBC: 7.6 10*3/uL (ref 4.0–10.5)

## 2017-10-14 LAB — BASIC METABOLIC PANEL
ANION GAP: 7 (ref 5–15)
BUN: 23 mg/dL — ABNORMAL HIGH (ref 6–20)
CHLORIDE: 102 mmol/L (ref 101–111)
CO2: 27 mmol/L (ref 22–32)
Calcium: 8.5 mg/dL — ABNORMAL LOW (ref 8.9–10.3)
Creatinine, Ser: 0.83 mg/dL (ref 0.61–1.24)
GFR calc Af Amer: >60 mL/min (ref >60)
GFR calc non Af Amer: >60 mL/min (ref >60)
Glucose, Bld: 103 mg/dL — ABNORMAL HIGH (ref 65–99)
POTASSIUM: 4 mmol/L (ref 3.5–5.1)
SODIUM: 136 mmol/L (ref 135–145)

## 2017-10-14 LAB — VALPROIC ACID LEVEL: Valproic Acid Lvl: 18 ug/mL — ABNORMAL LOW (ref 50.0–100.0)

## 2017-10-14 MED ORDER — DIVALPROEX SODIUM 500 MG PO DR TAB
1000.0000 mg | DELAYED_RELEASE_TABLET | Freq: Two times a day (BID) | ORAL | Status: DC
  Administered 2017-10-14 – 2017-11-12 (×55): 1000 mg via ORAL
  Filled 2017-10-14 (×62): qty 2

## 2017-10-14 MED ORDER — QUETIAPINE FUMARATE 50 MG PO TABS
50.0000 mg | ORAL_TABLET | Freq: Every day | ORAL | Status: DC
  Administered 2017-10-15 – 2017-10-17 (×3): 50 mg via ORAL
  Filled 2017-10-14 (×3): qty 1

## 2017-10-14 MED ORDER — QUETIAPINE FUMARATE 50 MG PO TABS
50.0000 mg | ORAL_TABLET | Freq: Two times a day (BID) | ORAL | Status: DC
  Administered 2017-10-14: 50 mg via ORAL
  Filled 2017-10-14: qty 1

## 2017-10-14 MED ORDER — DIVALPROEX SODIUM 500 MG PO DR TAB
750.0000 mg | DELAYED_RELEASE_TABLET | Freq: Two times a day (BID) | ORAL | Status: DC
  Administered 2017-10-14: 10:00:00 750 mg via ORAL
  Filled 2017-10-14 (×2): qty 1

## 2017-10-14 NOTE — NC FL2 (Signed)
[place] MEDICAID FL2 LEVEL OF CARE SCREENING TOOL     IDENTIFICATION  Patient Name: ANTWINE AGOSTO Birthdate: 1981/11/20 Sex: male Admission Date (Current Location): 09/06/2017  [facility] and [place] Number:  Producer, television/film/video and Address:  [facility] [place], [address] [place], [place] [address]      Provider Number: [id number]  Attending Physician Name and Address:  Md, Trauma, MD  Relative Name and Phone Number:       Current Level of Care: Hospital Recommended Level of Care: Skilled Nursing Facility Prior Approval Number:    Date Approved/Denied:   PASRR Number:    Discharge Plan: SNF    Current Diagnoses: Patient Active Problem List   Diagnosis Date Noted  . Encounter for care related to feeding tube   . H/O cervical fracture   . History of pneumothorax   . Tibia/fibula fracture   . 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 Cytotoxic brain edema (HCC) 09/11/2017  . Left hemiplegia (HCC) 09/11/2017  . Left homonymous hemianopsia 09/11/2017  . Closed right pilon fracture, initial encounter 09/09/2017  . C6 cervical fracture (HCC) 09/06/2017    Orientation RESPIRATION BLADDER Height & Weight     Self, Time, Situation, Place  Normal Continent Weight: 126 lb 12.8 oz (57.5 kg) Height:  5\' 10"  (177.8 cm)  BEHAVIORAL SYMPTOMS/MOOD NEUROLOGICAL BOWEL NUTRITION STATUS      Continent Diet (Regular Diet - Thin Liquids)  AMBULATORY STATUS COMMUNICATION OF NEEDS Skin   Limited Assist Verbally Surgical wounds, Skin abrasions (Right Leg / Groin / Ankle Incision)                       Personal Care Assistance Level of Assistance  Bathing, Feeding, Dressing Bathing Assistance: Limited assistance Feeding assistance: Independent Dressing Assistance: Limited assistance     Functional  Limitations Info  Sight, Hearing, Speech Sight Info: Adequate Hearing Info: Adequate Speech Info: Adequate    SPECIAL CARE FACTORS FREQUENCY  PT (By licensed PT), OT (By licensed OT)     PT Frequency: 3x week OT Frequency: 3x week            Contractures Contractures Info: Not present    Additional Factors Info  Code Status, Allergies, Psychotropic Code Status Info: Full code Allergies Info: Chlorhexidine, Lamictal Lamotrigine Psychotropic Info: Depakote / Seroquel         Current Medications (10/14/2017):  This is the current hospital active medication list Current Facility-Administered Medications  Medication Dose Route Frequency Provider Last Rate Last Dose  . acetaminophen (TYLENOL) tablet 1,000 mg  1,000 mg Oral Q6H Simaan, Francine Graven, PA-C   1,000 mg at 10/14/17 1013  . aspirin chewable tablet 81 mg  81 mg Oral Daily Micki Riley, MD   81 mg at 10/14/17 1014  . ceFEPIme (MAXIPIME) 2 g in dextrose 5 % 50 mL IVPB  2 g Intravenous Q8H Daiva Eves, Lisette Grinder, MD   Stopped at 10/14/17 1043  . clopidogrel (PLAVIX) tablet 75 mg  75 mg Oral Daily Patteson, Samuel A, NP   75 mg at 10/14/17 1013  . diphenhydrAMINE (BENADRYL) capsule 25 mg  25 mg Oral Q6H PRN Cristie Hem, PA-C   25 mg at 10/12/17 2352  . divalproex (DEPAKOTE) DR tablet 750 mg  750 mg Oral Q12H Rayburn, Alphonsus Sias,  PA-C   750 mg at 10/14/17 1013  . docusate sodium (COLACE) capsule 100 mg  100 mg Oral BID PRN Violeta Gelinashompson, Burke, MD   100 mg at 09/26/17 1202  . feeding supplement (ENSURE ENLIVE) (ENSURE ENLIVE) liquid 237 mL  237 mL Oral TID BM Jimmye NormanWyatt, James, MD   237 mL at 10/14/17 1014  . haloperidol lactate (HALDOL) injection 5 mg  5 mg Intravenous Q6H PRN Violeta Gelinashompson, Burke, MD   5 mg at 09/25/17 2124  . hydrocortisone cream 1 %   Topical TID Violeta Gelinashompson, Burke, MD      . HYDROmorphone (DILAUDID) injection 0.5 mg  0.5 mg Intravenous Q4H PRN Jimmye NormanWyatt, James, MD      . lactated ringers infusion   Intravenous Continuous  Lewie LoronGermeroth, John, MD 10 mL/hr at 09/19/17 [phone]    . lactated ringers infusion   Intravenous Continuous Beryle LatheBrock, Thomas E, MD 10 mL/hr at 10/02/17 1023    . LORazepam (ATIVAN) tablet 1-2 mg  1-2 mg Oral Q4H PRN Kinsinger, De BlanchLuke Aaron, MD   2 mg at 10/03/17 0057  . midazolam (VERSED) 2 MG/ML syrup 2-4 mg  2-4 mg Oral Q2H PRN Kinsinger, De BlanchLuke Aaron, MD   4 mg at 10/03/17 2202  . ondansetron (ZOFRAN-ODT) disintegrating tablet 4 mg  4 mg Oral Q6H PRN Violeta Gelinashompson, Burke, MD       Or  . ondansetron [facility](ZOFRAN) injection 4 mg  4 mg Intravenous Q6H PRN Violeta Gelinashompson, Burke, MD      . oxyCODONE (Oxy IR/ROXICODONE) immediate release tablet 5-10 mg  5-10 mg Oral Q4H PRN Focht, Mafalda Mcginniss L, PA   10 mg at 10/14/17 1013  . pantoprazole (PROTONIX) EC tablet 40 mg  40 mg Oral Daily Joaquim Namowell, Lisa K, RPH   40 mg at 10/14/17 1013  . QUEtiapine (SEROQUEL) tablet 50 mg  50 mg Oral BID Rayburn, Kelly A, PA-C   50 mg at 10/14/17 1014  . RESOURCE THICKENUP CLEAR   Oral PRN Violeta Gelinashompson, Burke, MD      . sodium chloride flush (NS) 0.9 % injection 10-40 mL  10-40 mL Intracatheter Q12H Freeman CaldronJeffery, Michael J, PA-C   10 mL at 10/12/17 1000  . sodium chloride flush (NS) 0.9 % injection 10-40 mL  10-40 mL Intracatheter PRN Freeman CaldronJeffery, Michael J, PA-C   10 mL at 10/06/17 2232  . sodium chloride flush (NS) 0.9 % injection 10-40 mL  10-40 mL Intracatheter PRN Adam PhenixSimaan, Elizabeth S, PA-C   10 mL at 10/12/17 [id number]     Discharge Medications: Please see discharge summary for a list of discharge medications.  Relevant Imaging Results:  Relevant Lab Results:   Additional Information SSN [id number] / Patient needs 7 more weeks of IV [address]Cephapime  Jesse Aracelia Brinson, LCSW [phone]
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
## 2017-10-14 NOTE — NC FL2 (Deleted)
[place] MEDICAID FL2 LEVEL OF CARE SCREENING TOOL     IDENTIFICATION  Patient Name: Dustin Marquez Birthdate: 1981/05/31 Sex: male Admission Date (Current Location): 09/06/2017  [facility] and [place] Number:  Producer, television/film/video and Address:  The [place]. [facility], [address], [place], [place] [address]      Provider Number: [id number]  Attending Physician Name and Address:  Md, Trauma, MD  Relative Name and Phone Number:       Current Level of Care: Hospital Recommended Level of Care: Skilled Nursing Facility Prior Approval Number:    Date Approved/Denied:   PASRR Number:    Discharge Plan: SNF    Current Diagnoses: Patient Active Problem List   Diagnosis Date Noted  . Encounter for care related to feeding tube   . H/O cervical fracture   . History of pneumothorax   . Tibia/fibula fracture   . 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 Cytotoxic brain edema (HCC) 09/11/2017  . Left hemiplegia (HCC) 09/11/2017  . Left homonymous hemianopsia 09/11/2017  . Closed right pilon fracture, initial encounter 09/09/2017  . C6 cervical fracture (HCC) 09/06/2017    Orientation RESPIRATION BLADDER Height & Weight     Self, Time, Situation, Place  Normal Continent Weight: 126 lb 12.8 oz (57.5 kg) Height:  5\' 10"  (177.8 cm)  BEHAVIORAL SYMPTOMS/MOOD NEUROLOGICAL BOWEL NUTRITION STATUS      Continent Diet (Regular Diet - Thin Liquids)  AMBULATORY STATUS COMMUNICATION OF NEEDS Skin   Limited Assist Verbally Surgical wounds, Skin abrasions (Right Leg / Groin / Ankle Incision)                       Personal Care Assistance Level of Assistance  Bathing, Feeding, Dressing Bathing Assistance: Limited assistance Feeding assistance: Independent Dressing Assistance: Limited assistance     Functional  Limitations Info  Sight, Hearing, Speech Sight Info: Adequate Hearing Info: Adequate Speech Info: Adequate    SPECIAL CARE FACTORS FREQUENCY  PT (By licensed PT), OT (By licensed OT)     PT Frequency: 3x week OT Frequency: 3x week            Contractures Contractures Info: Not present    Additional Factors Info  Code Status, Allergies, Psychotropic Code Status Info: Full code Allergies Info: Chlorhexidine, Lamictal Lamotrigine Psychotropic Info: Depakote / Seroquel         Current Medications (10/14/2017):  This is the current hospital active medication list Current Facility-Administered Medications  Medication Dose Route Frequency Provider Last Rate Last Dose  . acetaminophen (TYLENOL) tablet 1,000 mg  1,000 mg Oral Q6H Simaan, Francine Graven, PA-C   1,000 mg at 10/14/17 1013  . aspirin chewable tablet 81 mg  81 mg Oral Daily Micki Riley, MD   81 mg at 10/14/17 1014  . ceFEPIme (MAXIPIME) 2 g in dextrose 5 % 50 mL IVPB  2 g Intravenous Q8H Daiva Eves, Lisette Grinder, MD   Stopped at 10/14/17 1043  . clopidogrel (PLAVIX) tablet 75 mg  75 mg Oral Daily Patteson, Samuel A, NP   75 mg at 10/14/17 1013  . diphenhydrAMINE (BENADRYL) capsule 25 mg  25 mg Oral Q6H PRN Cristie Hem, PA-C   25 mg at 10/12/17 2352  . divalproex (DEPAKOTE) DR tablet 750 mg  750 mg Oral Q12H Rayburn, Alphonsus Sias,  PA-C   750 mg at 10/14/17 1013  . docusate sodium (COLACE) capsule 100 mg  100 mg Oral BID PRN Violeta Gelinashompson, Burke, MD   100 mg at 09/26/17 1202  . feeding supplement (ENSURE ENLIVE) (ENSURE ENLIVE) liquid 237 mL  237 mL Oral TID BM Jimmye NormanWyatt, James, MD   237 mL at 10/14/17 1014  . haloperidol lactate (HALDOL) injection 5 mg  5 mg Intravenous Q6H PRN Violeta Gelinashompson, Burke, MD   5 mg at 09/25/17 2124  . hydrocortisone cream 1 %   Topical TID Violeta Gelinashompson, Burke, MD      . HYDROmorphone (DILAUDID) injection 0.5 mg  0.5 mg Intravenous Q4H PRN Jimmye NormanWyatt, James, MD      . lactated ringers infusion   Intravenous Continuous  Lewie LoronGermeroth, John, MD 10 mL/hr at 09/19/17 [phone]    . lactated ringers infusion   Intravenous Continuous Beryle LatheBrock, Thomas E, MD 10 mL/hr at 10/02/17 1023    . LORazepam (ATIVAN) tablet 1-2 mg  1-2 mg Oral Q4H PRN Kinsinger, De BlanchLuke Aaron, MD   2 mg at 10/03/17 0057  . midazolam (VERSED) 2 MG/ML syrup 2-4 mg  2-4 mg Oral Q2H PRN Kinsinger, De BlanchLuke Aaron, MD   4 mg at 10/03/17 2202  . ondansetron (ZOFRAN-ODT) disintegrating tablet 4 mg  4 mg Oral Q6H PRN Violeta Gelinashompson, Burke, MD       Or  . ondansetron [facility](ZOFRAN) injection 4 mg  4 mg Intravenous Q6H PRN Violeta Gelinashompson, Burke, MD      . oxyCODONE (Oxy IR/ROXICODONE) immediate release tablet 5-10 mg  5-10 mg Oral Q4H PRN Focht, Jahn Franchini L, PA   10 mg at 10/14/17 1013  . pantoprazole (PROTONIX) EC tablet 40 mg  40 mg Oral Daily Joaquim Namowell, Lisa K, RPH   40 mg at 10/14/17 1013  . QUEtiapine (SEROQUEL) tablet 50 mg  50 mg Oral BID Rayburn, Kelly A, PA-C   50 mg at 10/14/17 1014  . RESOURCE THICKENUP CLEAR   Oral PRN Violeta Gelinashompson, Burke, MD      . sodium chloride flush (NS) 0.9 % injection 10-40 mL  10-40 mL Intracatheter Q12H Freeman CaldronJeffery, Michael J, PA-C   10 mL at 10/12/17 1000  . sodium chloride flush (NS) 0.9 % injection 10-40 mL  10-40 mL Intracatheter PRN Freeman CaldronJeffery, Michael J, PA-C   10 mL at 10/06/17 2232  . sodium chloride flush (NS) 0.9 % injection 10-40 mL  10-40 mL Intracatheter PRN Adam PhenixSimaan, Elizabeth S, PA-C   10 mL at 10/12/17 [id number]     Discharge Medications: Please see discharge summary for a list of discharge medications.  Relevant Imaging Results:  Relevant Lab Results:   Additional Information SSN [id number]   Macario GoldsJesse Kosisochukwu [place], [place] [phone]

## 2017-10-14 NOTE — Clinical Social Work Note (Signed)
Clinical Social Worker continuing to follow patient and family for support and discharge planning needs.  Patient is agreeable with placement.  CSW has spoken with CSW ChiropodistAssistant Director who plans to further proceed with Wellsite geologistmedical director to determine plan of care moving forward.  CSW has initiated a bed search as patient continues to improve, however still requires 7 more weeks of IV antibiotics.  CSW remains available for support and to facilitate patient discharge needs.  Macario GoldsJesse Jeda Pardue, [place] [phone]
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
## 2017-10-15 MED ORDER — OXYCODONE HCL 5 MG PO TABS
5.0000 mg | ORAL_TABLET | ORAL | Status: DC | PRN
  Administered 2017-10-15 – 2017-11-12 (×76): 5 mg via ORAL
  Filled 2017-10-15 (×78): qty 1

## 2017-10-15 MED ORDER — OXYCODONE HCL 5 MG PO TABS: 5.0000 mg | ORAL_TABLET | ORAL | Status: DC | PRN

## 2017-10-15 MED ORDER — ACETAMINOPHEN 325 MG PO TABS
650.0000 mg | ORAL_TABLET | Freq: Four times a day (QID) | ORAL | Status: DC | PRN
  Administered 2017-10-15 – 2017-10-22 (×4): 650 mg via ORAL
  Filled 2017-10-15 (×4): qty 2

## 2017-10-15 MED ORDER — HYDROMORPHONE HCL 1 MG/ML IJ SOLN: 0.5000 mg | INTRAMUSCULAR | Status: DC | PRN
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
## 2017-10-16 NOTE — Progress Notes (Signed)
Trauma Service Note  Subjective: Patient doing well.  Still getting IV antibiotics.  No distress.  Objective: Vital signs in last 24 hours: Temp:  [98.1 F (36.7 C)-99.5 F (37.5 C)] 98.7 F (37.1 C) (10/24 0700) Pulse Rate:  [84-89] 85 (10/24 0700) Resp:  [16-20] 18 (10/24 0700) BP: (112-122)/(73-79) 122/76 (10/24 0700) SpO2:  [97 %-100 %] 98 % (10/24 0700) Last BM Date: 10/14/17  Intake/Output from previous day: 10/23 0701 - 10/24 0700 In: 1130 [P.O.:1080; IV Piggyback:50] Out: -  Intake/Output this shift: No intake/output data recorded.  General: No acute distress.  Asked about C-collar and time left to be worn.  Lungs: Clear  Abd: Benign  Extremities: No changes  Neuro: Very alert and awake.  Lab Results: CBC   Recent Labs  10/14/17 0418  WBC 7.6  HGB 10.7*  HCT 33.1*  PLT 267   BMET  Recent Labs  10/14/17 0418  NA 136  K 4.0  CL 102  CO2 27  GLUCOSE 103*  BUN 23*  CREATININE 0.83  CALCIUM 8.5*   PT/INR No results for input(s): LABPROT, INR in the last 72 hours. ABG No results for input(s): PHART, HCO3 in the last 72 hours.  Invalid input(s): PCO2, PO2  Studies/Results: No results found.  Anti-infectives: Anti-infectives    Start     Dose/Rate Route Frequency Ordered Stop   10/08/17 0000  ceFEPime (MAXIPIME) IVPB     2 g Intravenous Every 8 hours 10/08/17 1131     10/05/17 1415  ceFEPIme (MAXIPIME) 2 g in dextrose 5 % 50 mL IVPB     2 g 100 mL/hr over 30 Minutes Intravenous Every 8 hours 10/05/17 1411     10/04/17 1500  meropenem (MERREM) 1 g in sodium chloride 0.9 % 100 mL IVPB  Status:  Discontinued     1 g 200 mL/hr over 30 Minutes Intravenous Every 8 hours 10/04/17 1453 10/05/17 1411   10/02/17 1700  linezolid (ZYVOX) tablet 600 mg  Status:  Discontinued     600 mg Oral Every 12 hours 10/02/17 1532 10/04/17 1440   10/02/17 1600  levofloxacin (LEVAQUIN) tablet 750 mg  Status:  Discontinued     750 mg Oral Daily 10/02/17 1532  10/05/17 1111   10/02/17 1530  linezolid (ZYVOX) tablet 600 mg  Status:  Discontinued     600 mg Oral Every 12 hours 10/02/17 1529 10/02/17 1532   10/02/17 1148  tobramycin (NEBCIN) powder  Status:  Discontinued       As needed 10/02/17 1148 10/02/17 1215   10/02/17 1148  vancomycin (VANCOCIN) powder  Status:  Discontinued       As needed 10/02/17 1148 10/02/17 1215   10/02/17 1030  vancomycin (VANCOCIN) IVPB 1000 mg/200 mL premix  Status:  Discontinued     1,000 mg 200 mL/hr over 60 Minutes Intravenous To Surgery 10/02/17 0820 10/02/17 1133   10/01/17 1200  cephALEXin (KEFLEX) capsule 500 mg  Status:  Discontinued     500 mg Oral Every 6 hours 10/01/17 0933 10/02/17 1532   09/30/17 1800  cephALEXin (KEFLEX) capsule 500 mg  Status:  Discontinued     500 mg Oral Every 6 hours 09/30/17 1018 10/01/17 0746   09/29/17 2200  cephALEXin (KEFLEX) capsule 500 mg  Status:  Discontinued     500 mg Oral 2 times daily 09/29/17 1407 09/30/17 1018   09/29/17 1415  cephALEXin (KEFLEX) capsule 500 mg  Status:  Discontinued     500 mg  Oral 2 times daily 09/29/17 1402 09/29/17 1407   09/27/17 1130  ciprofloxacin (CIPRO) tablet 500 mg  Status:  Discontinued     500 mg Oral 2 times daily 09/27/17 1050 09/29/17 1402   09/27/17 0800  cefTRIAXone (ROCEPHIN) 1 g in dextrose 5 % 50 mL IVPB  Status:  Discontinued    Comments:  Pharmacy may adjust dosing strength / duration / interval for maximal efficacy   1 g 100 mL/hr over 30 Minutes Intravenous Every 24 hours 09/27/17 0756 09/27/17 1050   09/19/17 1430  ceFAZolin (ANCEF) IVPB 2g/100 mL premix     2 g 200 mL/hr over 30 Minutes Intravenous Every 8 hours 09/19/17 1428 09/20/17 0548   09/19/17 1229  vancomycin (VANCOCIN) powder  Status:  Discontinued       As needed 09/19/17 1229 09/19/17 1239   09/19/17 1000  ceFAZolin (ANCEF) IVPB 2 g/50 mL premix     2 g 100 mL/hr over 30 Minutes Intravenous  Once 09/19/17 0947 09/19/17 1026   09/19/17 0946  ceFAZolin (ANCEF)  2-4 GM/100ML-% IVPB    Comments:  [name], [name]   : cabinet override      09/19/17 0946 09/19/17 2159   09/09/17 2100  ceFAZolin (ANCEF) IVPB 1 g/50 mL premix     1 g 100 mL/hr over 30 Minutes Intravenous Every 8 hours 09/09/17 1548 09/10/17 [id number]      Assessment/Plan: s/p Procedure(s): IRRIGATION AND DEBRIDEMENT EXTREMITY Disposition planning dependent upon family decisions.  LOS: 40 days   Marta LamasJames O. Gae BonWyatt, III, MD, FACS [phone] Trauma Surgeon 10/16/2017
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
## 2017-10-17 LAB — BASIC METABOLIC PANEL
Anion gap: 8 (ref 5–15)
BUN: 19 mg/dL (ref 6–20)
CHLORIDE: 102 mmol/L (ref 101–111)
CO2: 29 mmol/L (ref 22–32)
CREATININE: 0.75 mg/dL (ref 0.61–1.24)
Calcium: 8.8 mg/dL — ABNORMAL LOW (ref 8.9–10.3)
GFR calc Af Amer: >60 mL/min (ref >60)
GFR calc non Af Amer: >60 mL/min (ref >60)
Glucose, Bld: 102 mg/dL — ABNORMAL HIGH (ref 65–99)
POTASSIUM: 4.1 mmol/L (ref 3.5–5.1)
SODIUM: 139 mmol/L (ref 135–145)

## 2017-10-17 LAB — CBC
HEMATOCRIT: 33.7 % — AB (ref 39.0–52.0)
HEMOGLOBIN: 11.1 g/dL — AB (ref 13.0–17.0)
MCH: 29.3 pg (ref 26.0–34.0)
MCHC: 32.9 g/dL (ref 30.0–36.0)
MCV: 88.9 fL (ref 78.0–100.0)
Platelets: 276 10*3/uL (ref 150–400)
RBC: 3.79 MIL/uL — AB (ref 4.22–5.81)
RDW: 16.6 % — ABNORMAL HIGH (ref 11.5–15.5)
WBC: 7.5 10*3/uL (ref 4.0–10.5)

## 2017-10-17 MED ORDER — HYDROMORPHONE HCL 1 MG/ML IJ SOLN
0.5000 mg | Freq: Two times a day (BID) | INTRAMUSCULAR | Status: DC | PRN
  Administered 2017-10-18 – 2017-10-21 (×6): 0.5 mg via INTRAVENOUS
  Filled 2017-10-17 (×7): qty 0.5
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
## 2017-10-18 MED ORDER — QUETIAPINE FUMARATE 25 MG PO TABS
25.0000 mg | ORAL_TABLET | Freq: Every day | ORAL | Status: DC
  Administered 2017-10-18 – 2017-10-19 (×2): 25 mg via ORAL
  Filled 2017-10-18 (×2): qty 1
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
## 2017-10-18 NOTE — Clinical Social Work Note (Signed)
Clinical Social Worker continuing to follow patient and family for support and discharge planning needs. Patient is agreeable with placement. CSW has spoken with CSW ChiropodistAssistant Director who plans to further proceed with Wellsite geologistmedical director to determine plan of care moving forward.  CSW has initiated a bed search as patient continues to improve, however still requires 6 more weeks of IV antibiotics. CSW remains available for support and to facilitate patient discharge needs.  Dustin Marquez, [place] [phone]

## 2017-10-18 NOTE — Progress Notes (Signed)
Patient ID: Dustin Marquez, male   DOB: Apr 27, 1981, 36 y.o.   MRN: [id number]  Spoke with neurosurgery. Patient will need c-collar for at least 2 months. He can plan to follow up with Dr. Yetta BarreJones in about 2 weeks.  Dustin Marquez

## 2017-10-18 NOTE — Progress Notes (Signed)
Rehab admissions - I reviewed progress and I spoke with Dr. Riley KillSwartz about patient progress and needs.  Patient has achieved supervision level with transfers and was able to ambulate 100 feet at supervision level.  Patient is doing too well now to meet criteria for acute inpatient rehab admission.  [facility].  Call me for questions.  #[phone]

## 2017-10-19 LAB — VALPROIC ACID LEVEL: Valproic Acid Lvl: 81 ug/mL (ref 50.0–100.0)

## 2017-10-19 NOTE — Progress Notes (Signed)
Patient ID: Dustin Marquez, male   DOB: 10-Jun-1981, 36 y.o.   MRN: [id number] [facility] Surgery Progress Note:   17 Days Post-Op  Subjective: Mental status is clear.  Sitting up on the side of the bed Objective: Vital signs in last 24 hours: Temp:  [98.6 F (37 C)-99 F (37.2 C)] 99 F (37.2 C) (10/27 0931) Pulse Rate:  [86-94] 94 (10/27 0931) Resp:  [18] 18 (10/27 0931) BP: (109-127)/(65-74) 127/74 (10/27 0931) SpO2:  [97 %-100 %] 100 % (10/27 0931)  Intake/Output from previous day: 10/26 0701 - 10/27 0700 In: 560 [P.O.:360; IV Piggyback:200] Out: 400 [Urine:400] Intake/Output this shift: No intake/output data recorded.  Physical Exam: Work of breathing is normal.  Patient took his cervical collar off last night --no neck pain.    Lab Results:  No results found for this or any previous visit (from the past 48 hour(s)).  Radiology/Results: No results found.  Anti-infectives: Anti-infectives    Start     Dose/Rate Route Frequency Ordered Stop   10/08/17 0000  ceFEPime (MAXIPIME) IVPB     2 g Intravenous Every 8 hours 10/08/17 1131     10/05/17 1415  ceFEPIme (MAXIPIME) 2 g in dextrose 5 % 50 mL IVPB     2 g 100 mL/hr over 30 Minutes Intravenous Every 8 hours 10/05/17 1411     10/04/17 1500  meropenem (MERREM) 1 g in sodium chloride 0.9 % 100 mL IVPB  Status:  Discontinued     1 g 200 mL/hr over 30 Minutes Intravenous Every 8 hours 10/04/17 1453 10/05/17 1411   10/02/17 1700  linezolid (ZYVOX) tablet 600 mg  Status:  Discontinued     600 mg Oral Every 12 hours 10/02/17 1532 10/04/17 1440   10/02/17 1600  levofloxacin (LEVAQUIN) tablet 750 mg  Status:  Discontinued     750 mg Oral Daily 10/02/17 1532 10/05/17 1111   10/02/17 1530  linezolid (ZYVOX) tablet 600 mg  Status:  Discontinued     600 mg Oral Every 12 hours 10/02/17 1529 10/02/17 1532   10/02/17 1148  tobramycin (NEBCIN) powder  Status:  Discontinued       As needed 10/02/17 1148 10/02/17 1215    10/02/17 1148  vancomycin (VANCOCIN) powder  Status:  Discontinued       As needed 10/02/17 1148 10/02/17 1215   10/02/17 1030  vancomycin (VANCOCIN) IVPB 1000 mg/200 mL premix  Status:  Discontinued     1,000 mg 200 mL/hr over 60 Minutes Intravenous To Surgery 10/02/17 0820 10/02/17 1133   10/01/17 1200  cephALEXin (KEFLEX) capsule 500 mg  Status:  Discontinued     500 mg Oral Every 6 hours 10/01/17 0933 10/02/17 1532   09/30/17 1800  cephALEXin (KEFLEX) capsule 500 mg  Status:  Discontinued     500 mg Oral Every 6 hours 09/30/17 1018 10/01/17 0746   09/29/17 2200  cephALEXin (KEFLEX) capsule 500 mg  Status:  Discontinued     500 mg Oral 2 times daily 09/29/17 1407 09/30/17 1018   09/29/17 1415  cephALEXin (KEFLEX) capsule 500 mg  Status:  Discontinued     500 mg Oral 2 times daily 09/29/17 1402 09/29/17 1407   09/27/17 1130  ciprofloxacin (CIPRO) tablet 500 mg  Status:  Discontinued     500 mg Oral 2 times daily 09/27/17 1050 09/29/17 1402   09/27/17 0800  cefTRIAXone (ROCEPHIN) 1 g in dextrose 5 % 50 mL IVPB  Status:  Discontinued  Comments:  Pharmacy may adjust dosing strength / duration / interval for maximal efficacy   1 g 100 mL/hr over 30 Minutes Intravenous Every 24 hours 09/27/17 0756 09/27/17 1050   09/19/17 1430  ceFAZolin (ANCEF) IVPB 2g/100 mL premix     2 g 200 mL/hr over 30 Minutes Intravenous Every 8 hours 09/19/17 1428 09/20/17 0548   09/19/17 1229  vancomycin (VANCOCIN) powder  Status:  Discontinued       As needed 09/19/17 1229 09/19/17 1239   09/19/17 1000  ceFAZolin (ANCEF) IVPB 2 g/50 mL premix     2 g 100 mL/hr over 30 Minutes Intravenous  Once 09/19/17 0947 09/19/17 1026   09/19/17 0946  ceFAZolin (ANCEF) 2-4 GM/100ML-% IVPB    Comments:  [name], [name]   : cabinet override      09/19/17 0946 09/19/17 2159   09/09/17 2100  ceFAZolin (ANCEF) IVPB 1 g/50 mL premix     1 g 100 mL/hr over 30 Minutes Intravenous Every 8 hours 09/09/17 1548 09/10/17 [id number]       Assessment/Plan: Problem List: Patient Active Problem List   Diagnosis Date Noted  . Encounter for care related to feeding tube   . H/O cervical fracture   . History of pneumothorax   . Tibia/fibula fracture   . 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 Cytotoxic brain edema (HCC) 09/11/2017  . Left hemiplegia (HCC) 09/11/2017  . Left homonymous hemianopsia 09/11/2017  . Closed right pilon fracture, initial encounter 09/09/2017  . C6 cervical fracture (HCC) 09/06/2017    Appears stable;   17 Days Post-Op    LOS: 43 days   Matt B. Daphine DeutscherMartin, MD, [facility] Center[facility]ACS  [facility] [phone] beeper [phone]  10/19/2017 11:58 AM

## 2017-10-20 MED ORDER — QUETIAPINE FUMARATE 25 MG PO TABS
12.5000 mg | ORAL_TABLET | Freq: Every day | ORAL | Status: DC
Start: 2017-10-20 — End: 2017-10-21
  Administered 2017-10-20: 12.5 mg via ORAL
  Filled 2017-10-20: qty 1

## 2017-10-20 MED ORDER — SODIUM CHLORIDE 0.9 % IV SOLN
INTRAVENOUS | Status: DC
  Administered 2017-10-20 – 2017-10-26 (×4): via INTRAVENOUS
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
## 2017-10-20 NOTE — Progress Notes (Signed)
Rehab admissions - Please see my note from 10/26 am.  Patient is not appropriate for acute inpatient rehab admission.  He is already doing too well.  Call me for questions.  #[phone]

## 2017-10-21 MED ORDER — HYDROMORPHONE HCL 1 MG/ML IJ SOLN
0.5000 mg | Freq: Every day | INTRAMUSCULAR | Status: DC | PRN
  Administered 2017-10-21: 0.5 mg via INTRAVENOUS
  Filled 2017-10-21 (×2): qty 0.5

## 2017-10-21 MED ORDER — HYDROMORPHONE HCL 1 MG/ML IJ SOLN: 0.5000 mg | INTRAMUSCULAR | Status: DC | PRN

## 2017-10-21 MED ORDER — HYDROMORPHONE HCL 1 MG/ML IJ SOLN
0.5000 mg | INTRAMUSCULAR | Status: DC | PRN
  Administered 2017-10-21 – 2017-10-24 (×4): 0.5 mg via INTRAVENOUS
  Filled 2017-10-21 (×5): qty 0.5
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
## 2017-10-21 NOTE — Progress Notes (Signed)
Received call back from patient's brother, Thayer OhmChris regarding discharge plans for pt.  Thayer OhmChris states that he and his brothers are unable to care for pt at home, and they have discussed this with pt.  Thayer OhmChris wishes to proceed with SNF placement for pt for IV antibiotic therapy.  Will notify CSW and attending MD.    Quintella BatonJulie W. Nura Cahoon, RN, BSN  Trauma/Neuro ICU Case Manager [phone]
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
## 2017-10-21 NOTE — Clinical Social Work Note (Signed)
Clinical Social Worker continuing to follow patient and family for support and discharge planning needs. Patient is agreeable with placement. CSW has spoken with CSW ChiropodistAssistant Director who plans to further proceed with Wellsite geologistmedical director to determine plan of care moving forward. CSW has initiated a bed search as well as placed patient on difficult to place list.  Patient still requires 6 more weeks of IV antibiotics.  CSW spoke with Corrin [facility]Barbara [facility] and she plans to come 10/30 to evaluate patient for level II [facility].CSW remains available for support and to facilitate patient discharge needs.  Dustin GoldsJesse Fortino Marquez, Kentucky[place]CS[place] [phone]

## 2017-10-21 NOTE — Progress Notes (Signed)
Left message for patient's brother, Thayer OhmChris to discuss discharge plans.  Phone:  [phone].  If discharging home, pt will need a teachable caregiver in the home for IV antibiotic therapy.  If no teachable caregiver available, or family unwilling to do IV infusions, pt will need SNF, as originally planned.    Quintella BatonJulie W. Nthony Lefferts, RN, BSN  Trauma/Neuro ICU Case Manager [phone]
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
## 2017-10-23 MED ORDER — POLYETHYLENE GLYCOL 3350 17 G PO PACK
17.0000 g | PACK | Freq: Once | ORAL | Status: AC
  Administered 2017-10-23: 17 g via ORAL

## 2017-10-23 NOTE — Progress Notes (Signed)
ORTHOPAEDIC PROGRESS NOTE  s/p Procedure(s): EXTERNAL FIXATION RIGHT LOWER LEG and ORIF w ex fix removal 9/27 I&D for distal medial wound infection 10/10  SUBJECTIVE: NAD  OBJECTIVE: PE:RLE -  Hinged brace removed, knee stable varus/valgus. No drainage at incision. Looks healthy though skin still thin.  WWP foot, intact DF/PF  Vitals:   10/23/17 0044 10/23/17 0557  BP: 106/70 115/71  Pulse: 94 70  Resp: 20 20  Temp: 98.4 F (36.9 C) 98 F (36.7 C)  SpO2: 99% 100%     ASSESSMENT: Dustin Marquez is a 36 y.o. male with right tibia fracture sp Ex fix and ex fix removal and ORIF of tibia 9/27 and Right laterally based ligamentous injury of knee.   -I&D with incisional vac placement 10/10 - cultures sent.  PLAN: - Incision healthy appearing but still want to leave sutures in till weekend or early next week in setting of poor healing previously -Hinged brace to be dc'd today, work on ROM and extension primarily w PT -TDWB RLE  In boot, developing equinus contracture so should leave boot on. -Elevate operative extremity as tolerated to minimize swelling
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
## 2017-10-24 MED ORDER — POLYETHYLENE GLYCOL 3350 17 G PO PACK
17.0000 g | PACK | Freq: Every day | ORAL | Status: DC
  Administered 2017-10-24 – 2017-11-10 (×10): 17 g via ORAL
  Filled 2017-10-24 (×16): qty 1

## 2017-10-24 MED ORDER — HYDROMORPHONE HCL 1 MG/ML IJ SOLN
0.5000 mg | Freq: Four times a day (QID) | INTRAMUSCULAR | Status: DC | PRN
  Administered 2017-10-24 – 2017-11-11 (×38): 0.5 mg via INTRAVENOUS
  Filled 2017-10-24 (×40): qty 0.5
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
## 2017-10-25 NOTE — Plan of Care (Signed)
Problem: Skin Integrity: Goal: Risk for impaired skin integrity will decrease Outcome: Progressing Surgical incision to rle intact, no s/s of infection noted.
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
## 2017-10-25 NOTE — Clinical Social Work Note (Signed)
Clinical Social Worker continuing to follow patient and family for support and discharge planning needs. Patient is agreeable with placement. CSW has spoken with CSW Assistant Director who plans to further proceed with medical director to determine plan of care moving forward. CSW has initiated a bed search as well as placed patient on difficult to place list. Patientstill requires IV antibiotics.CSW remains available for support and to facilitate patient discharge needs.  Jesse Chanie Soucek, LCSW [phone]   
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
## 2017-10-27 MED ORDER — ACETAMINOPHEN 325 MG PO TABS
650.0000 mg | ORAL_TABLET | Freq: Four times a day (QID) | ORAL | Status: DC
  Administered 2017-10-27 – 2017-11-12 (×51): 650 mg via ORAL
  Filled 2017-10-27 (×57): qty 2
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
## 2017-10-28 LAB — COMPREHENSIVE METABOLIC PANEL
ALBUMIN: 3 g/dL — AB (ref 3.5–5.0)
ALK PHOS: 42 U/L (ref 38–126)
ALT: 24 U/L (ref 17–63)
ANION GAP: 7 (ref 5–15)
AST: 22 U/L (ref 15–41)
BUN: 24 mg/dL — ABNORMAL HIGH (ref 6–20)
CALCIUM: 8.8 mg/dL — AB (ref 8.9–10.3)
CHLORIDE: 102 mmol/L (ref 101–111)
CO2: 29 mmol/L (ref 22–32)
Creatinine, Ser: 0.87 mg/dL (ref 0.61–1.24)
GFR calc Af Amer: >60 mL/min (ref >60)
GFR calc non Af Amer: >60 mL/min (ref >60)
GLUCOSE: 100 mg/dL — AB (ref 65–99)
Potassium: 3.9 mmol/L (ref 3.5–5.1)
SODIUM: 138 mmol/L (ref 135–145)
Total Bilirubin: 0.6 mg/dL (ref 0.3–1.2)
Total Protein: 6.2 g/dL — ABNORMAL LOW (ref 6.5–8.1)

## 2017-10-28 LAB — CBC
HCT: 34 % — ABNORMAL LOW (ref 39.0–52.0)
HEMOGLOBIN: 11.2 g/dL — AB (ref 13.0–17.0)
MCH: 29.6 pg (ref 26.0–34.0)
MCHC: 32.9 g/dL (ref 30.0–36.0)
MCV: 89.7 fL (ref 78.0–100.0)
Platelets: 226 10*3/uL (ref 150–400)
RBC: 3.79 MIL/uL — AB (ref 4.22–5.81)
RDW: 16.5 % — ABNORMAL HIGH (ref 11.5–15.5)
WBC: 4.7 10*3/uL (ref 4.0–10.5)

## 2017-10-28 MED ORDER — METHOCARBAMOL 500 MG PO TABS
500.0000 mg | ORAL_TABLET | Freq: Three times a day (TID) | ORAL | Status: DC | PRN
  Administered 2017-10-28 – 2017-11-12 (×20): 500 mg via ORAL
  Filled 2017-10-28 (×20): qty 1
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
## 2017-10-28 NOTE — Plan of Care (Signed)
  Skin Integrity: Risk for impaired skin integrity will decrease 10/28/2017 0359 - Progressing by Luther Redourgott, Kevontay Burks, RN

## 2017-10-29 ENCOUNTER — Inpatient Hospital Stay (HOSPITAL_COMMUNITY): Payer: Medicaid Other

## 2017-10-29 ENCOUNTER — Other Ambulatory Visit: Payer: Self-pay
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
## 2017-10-29 NOTE — Clinical Social Work Note (Signed)
Clinical Social Worker continuing to follow patient and family for support and discharge planning needs. Patient is agreeable with placement. CSW has spoken with CSW ChiropodistAssistant Director who plans to further proceed with Wellsite geologistmedical director to determine plan of care moving forward. CSW has initiated a bed search as well as placed patient on difficult to place list. Patientstill requires IV antibiotics.CSW remains available for support and to facilitate patient discharge needs.  Macario GoldsJesse Alfretta Pinch, Kentucky[place]CS[place] [phone]
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
## 2017-10-31 ENCOUNTER — Inpatient Hospital Stay (HOSPITAL_COMMUNITY): Payer: Medicaid Other
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
## 2017-11-01 DIAGNOSIS — F1910 Other psychoactive substance abuse, uncomplicated: Secondary | ICD-10-CM

## 2017-11-01 DIAGNOSIS — F3170 Bipolar disorder, currently in remission, most recent episode unspecified: Secondary | ICD-10-CM

## 2017-11-01 DIAGNOSIS — F319 Bipolar disorder, unspecified: Secondary | ICD-10-CM
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
## 2017-11-01 NOTE — Progress Notes (Signed)
Patient ID: Dustin Marquez, male   DOB: 15-Jul-1981, 36 y.o.   MRN: [id number] c-spine xray stable, continue c-collar for at least 3 months, then repeat CT C-spine to eval fracture healing

## 2017-11-01 NOTE — Progress Notes (Signed)
CSW continuing to follow patient and family for support and discharge planning needs. Patient is difficult to place for IV antibiotics; still no bed available at this time.  CSW will continue to follow.  Blenda NicelyElizabeth Caydee [place], [place] Clinical Social Worker [phone]
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
## 2017-11-05 NOTE — Clinical Social Work Note (Signed)
Clinical Social Worker continuing to follow patient and family for support and discharge planning needs.  Patient is difficult to place for IV antibiotics, no bed available at this time.  CSW remains available for support and to facilitate patient discharge needs once bed available.  Dustin GoldsJesse Sharri Marquez, Kentucky[place]CS[place] [phone]

## 2017-11-05 NOTE — Progress Notes (Signed)
ORTHOPAEDIC PROGRESS NOTE  s/p Procedure(s): EXTERNAL FIXATION RIGHT LOWER LEG and ORIF w ex fix removal 9/27 I&D for distal medial wound infection 10/10  SUBJECTIVE: NAD  OBJECTIVE: PE:RLE -  Hinged brace removed, knee stable varus/valgus. No drainage at incision. Looks healthy.  WWP foot, intact DF/PF  Vitals:   11/05/17 0507 11/05/17 1022  BP: 102/66 128/82  Pulse: 64 95  Resp: 20 19  Temp: 98.2 F (36.8 C) 97.9 F (36.6 C)  SpO2: 100% 99%   XR: stable and appropriate alignment of fracture with intact hardware.  Minimal callus formation  ASSESSMENT: Dustin Marquez is a 36 y.o. male with right tibia fracture sp Ex fix and ex fix removal and ORIF of tibia 9/27 and Right laterally based ligamentous injury of knee.   -I&D with incisional vac placement 10/10 - cultures sent.  PLAN: -OK to start WBAT in boot, start slowly with therapy.  Ok for boot to be off while sleeping and dressing removed PRN.   -Discussed need to start weightbearing and the balance of possible plate failure but we hope weightbearing will stimulate healing.   -Follow up late December for repeat films if discharged.
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
## 2017-11-06 NOTE — Progress Notes (Signed)
SLP Cancellation Note  Patient Details Name: Dustin Marquez MRN: [id number] DOB: March 26, 1981   Cancelled treatment:       Reason Eval/Treat Not Completed: Patient declined, no reason specified   Tressie StalkerPat Chantal Worthey, M.S., CCC-SLP 11/06/2017, 3:54 PM
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
## 2017-11-07 NOTE — Progress Notes (Signed)
SLP Cancellation Note  Patient Details Name: Dustin Marquez MRN: [id number] DOB: 1981-02-25   Cancelled treatment:       Reason Eval/Treat Not Completed: Patient declined, no reason specified   Kieffer Blatz 11/07/2017, 1:35 PM
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
## 2017-11-09 NOTE — Plan of Care (Signed)
  Education: Knowledge of [facility] Education information/materials will improve 11/09/2017 0010 - Progressing by Luther Redourgott, Verenis Nicosia, RN   Physical Regulation: Ability to maintain clinical measurements within normal limits will improve 11/09/2017 0010 - Progressing by Luther Redourgott, Oleg Oleson, RN   Skin Integrity: Risk for impaired skin integrity will decrease 11/09/2017 0010 - Progressing by Luther Redourgott, Oma Marzan, RN

## 2017-11-09 NOTE — Clinical Social Work Note (Signed)
Clinical Social Worker continuing to follow patient and family for support and discharge planning needs.  [facility] have a difficult to place bed on Monday.  CSW remains available for support and will facilitate patient discharge needs once bed available.  Macario GoldsJesse Dolphus Linch, [place] [phone]
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
## 2017-11-11 NOTE — Progress Notes (Addendum)
[id number] Bedside shift report, pt A&Ox4, pt speaking fastly, moves from one topic to another. Updated with POC, c/o pain will medicated when available, no other needs at this time. Will continue to monitor.   0800 Pt assessed, see flowsheet, medicated for pain, pt asking for a "pain shot", MD at bedside speaking with pt and assessing leg, WCTM.  0915 Pt still c/o pain 10/10, medicated, see flowsheet, right leg with ACE banadage now removed by pt, steri strips to right ankle intact, no drainage noted. Pt still speaking fast, seems ancious, yelling at employees while walking by room, ativan given, WCTM closely.   1000 Pt updated that he may have a bed at rehab today. Pt asking what rehab, will speak to social worker and updated pt with information.   1145 Pt sitting up in chair, c/o pain, asked RN to wrap leg/foot with ACE bandage. Pt assisted back to bed, ortho boot removed, RN wrapped leg. Pt stated he now wants to take a nap. Fall precautions in place, [facility].   1330 RN called to room, pt c/o pain, medicated per MAR. WCTM.   1630 Pt up walking around hallways with PT, tolerating well. Walked by RN and asked for pain meds. Will medicate when back in room.
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
## 2017-11-11 NOTE — Progress Notes (Signed)
CSW updated by CSW AD that patient will have a bed at [facility] to admit tomorrow (11/20). CSW will update family and will follow to facilitate discharge when bed is available.  Blenda NicelyElizabeth Bode Pieper, [place]CS[place] Clinical Social Worker [phone]

## 2017-11-11 NOTE — Progress Notes (Signed)
Trauma Service Note  Subjective: No new problems. Still missing some of his personal belongings. Tolerating diet without n/v.  Objective: Vital signs in last 24 hours: Temp:  [98 F (36.7 C)-99.2 F (37.3 C)] 98 F (36.7 C) (11/19 0522) Pulse Rate:  [74-97] 81 (11/19 0522) Resp:  [17-18] 18 (11/19 0522) BP: (103-119)/(65-82) 119/82 (11/19 0522) SpO2:  [99 %-100 %] 100 % (11/19 0522) Weight:  [67.4 kg (148 lb 9.4 oz)] 67.4 kg (148 lb 9.4 oz) (11/19 0526) Last BM Date: 11/10/17  Intake/Output from previous day: 11/18 0701 - 11/19 0700 In: 50 [IV Piggyback:50] Out: -  Intake/Output this shift: No intake/output data recorded.  General: No acute distress  Lungs: Clear, maybe some rales in the right base posteriorly  Abd: Soft, NT/ND  Extremities: No changes  Neuro: Intact  Lab Results: CBC  No results for input(s): WBC, HGB, HCT, PLT in the last 72 hours. BMET No results for input(s): NA, K, CL, CO2, GLUCOSE, BUN, CREATININE, CALCIUM in the last 72 hours. PT/INR No results for input(s): LABPROT, INR in the last 72 hours. ABG No results for input(s): PHART, HCO3 in the last 72 hours.  Invalid input(s): PCO2, PO2  Studies/Results: No results found.  Anti-infectives: Anti-infectives (From admission, onward)   Start     Dose/Rate Route Frequency Ordered Stop   10/08/17 0000  ceFEPime (MAXIPIME) IVPB     2 g Intravenous Every 8 hours 10/08/17 1131     10/05/17 1415  ceFEPIme (MAXIPIME) 2 g in dextrose 5 % 50 mL IVPB     2 g 100 mL/hr over 30 Minutes Intravenous Every 8 hours 10/05/17 1411     10/04/17 1500  meropenem (MERREM) 1 g in sodium chloride 0.9 % 100 mL IVPB  Status:  Discontinued     1 g 200 mL/hr over 30 Minutes Intravenous Every 8 hours 10/04/17 1453 10/05/17 1411   10/02/17 1700  linezolid (ZYVOX) tablet 600 mg  Status:  Discontinued     600 mg Oral Every 12 hours 10/02/17 1532 10/04/17 1440   10/02/17 1600  levofloxacin (LEVAQUIN) tablet 750 mg   Status:  Discontinued     750 mg Oral Daily 10/02/17 1532 10/05/17 1111   10/02/17 1530  linezolid (ZYVOX) tablet 600 mg  Status:  Discontinued     600 mg Oral Every 12 hours 10/02/17 1529 10/02/17 1532   10/02/17 1148  tobramycin (NEBCIN) powder  Status:  Discontinued       As needed 10/02/17 1148 10/02/17 1215   10/02/17 1148  vancomycin (VANCOCIN) powder  Status:  Discontinued       As needed 10/02/17 1148 10/02/17 1215   10/02/17 1030  vancomycin (VANCOCIN) IVPB 1000 mg/200 mL premix  Status:  Discontinued     1,000 mg 200 mL/hr over 60 Minutes Intravenous To Surgery 10/02/17 0820 10/02/17 1133   10/01/17 1200  cephALEXin (KEFLEX) capsule 500 mg  Status:  Discontinued     500 mg Oral Every 6 hours 10/01/17 0933 10/02/17 1532   09/30/17 1800  cephALEXin (KEFLEX) capsule 500 mg  Status:  Discontinued     500 mg Oral Every 6 hours 09/30/17 1018 10/01/17 0746   09/29/17 2200  cephALEXin (KEFLEX) capsule 500 mg  Status:  Discontinued     500 mg Oral 2 times daily 09/29/17 1407 09/30/17 1018   09/29/17 1415  cephALEXin (KEFLEX) capsule 500 mg  Status:  Discontinued     500 mg Oral 2 times daily 09/29/17 1402  09/29/17 1407   09/27/17 1130  ciprofloxacin (CIPRO) tablet 500 mg  Status:  Discontinued     500 mg Oral 2 times daily 09/27/17 1050 09/29/17 1402   09/27/17 0800  cefTRIAXone (ROCEPHIN) 1 g in dextrose 5 % 50 mL IVPB  Status:  Discontinued    Comments:  Pharmacy may adjust dosing strength / duration / interval for maximal efficacy   1 g 100 mL/hr over 30 Minutes Intravenous Every 24 hours 09/27/17 0756 09/27/17 1050   09/19/17 1430  ceFAZolin (ANCEF) IVPB 2g/100 mL premix     2 g 200 mL/hr over 30 Minutes Intravenous Every 8 hours 09/19/17 1428 09/20/17 0548   09/19/17 1229  vancomycin (VANCOCIN) powder  Status:  Discontinued       As needed 09/19/17 1229 09/19/17 1239   09/19/17 1000  ceFAZolin (ANCEF) IVPB 2 g/50 mL premix     2 g 100 mL/hr over 30 Minutes Intravenous  Once  09/19/17 0947 09/19/17 1026   09/19/17 0946  ceFAZolin (ANCEF) 2-4 GM/100ML-% IVPB    Comments:  [name], [name]   : cabinet override      09/19/17 0946 09/19/17 2159   09/09/17 2100  ceFAZolin (ANCEF) IVPB 1 g/50 mL premix     1 g 100 mL/hr over 30 Minutes Intravenous Every 8 hours 09/09/17 1548 09/10/17 [id number]      Assessment/Plan: s/p Procedure(s): IRRIGATION AND DEBRIDEMENT EXTREMITY Awaiting appropriate disposition.  LOS: 66 days   Stephanie Couphristopher M. Cliffton AstersWhite, M.D. [facility]

## 2017-11-12 ENCOUNTER — Encounter (HOSPITAL_COMMUNITY): Payer: Self-pay | Admitting: Emergency Medicine

## 2017-11-12 ENCOUNTER — Encounter: Payer: Self-pay | Admitting: Internal Medicine

## 2017-11-12 ENCOUNTER — Inpatient Hospital Stay (HOSPITAL_COMMUNITY): Payer: Medicaid Other

## 2017-11-12 ENCOUNTER — Non-Acute Institutional Stay (SKILLED_NURSING_FACILITY): Payer: Medicaid Other | Admitting: Internal Medicine

## 2017-11-12 ENCOUNTER — Inpatient Hospital Stay
Admission: RE | Admit: 2017-11-12 | Discharge: 2017-12-12 | Disposition: A | Discharge status: Home / Self Care | Payer: Self-pay | Source: Ambulatory Visit | Attending: Internal Medicine | Admitting: Internal Medicine

## 2017-11-12 DIAGNOSIS — G8194 Hemiplegia, unspecified affecting left nondominant side: Secondary | ICD-10-CM | POA: Diagnosis not present

## 2017-11-12 DIAGNOSIS — T847XXS Infection and inflammatory reaction due to other internal orthopedic prosthetic devices, implants and grafts, sequela: Secondary | ICD-10-CM

## 2017-11-12 DIAGNOSIS — F3170 Bipolar disorder, currently in remission, most recent episode unspecified: Secondary | ICD-10-CM

## 2017-11-12 DIAGNOSIS — S12500S Unspecified displaced fracture of sixth cervical vertebra, sequela: Secondary | ICD-10-CM

## 2017-11-12 MED ORDER — OXYCODONE HCL 5 MG PO TABS: 5.0000 mg | ORAL_TABLET | ORAL | 0 refills | Status: DC | PRN

## 2017-11-12 MED ORDER — PANTOPRAZOLE SODIUM 40 MG PO TBEC: 40.0000 mg | DELAYED_RELEASE_TABLET | Freq: Every day | ORAL | Status: DC

## 2017-11-12 MED ORDER — DIVALPROEX SODIUM 500 MG PO DR TAB: 1000.0000 mg | DELAYED_RELEASE_TABLET | Freq: Two times a day (BID) | ORAL | Status: DC

## 2017-11-12 MED ORDER — ASPIRIN 81 MG PO CHEW: 81.0000 mg | CHEWABLE_TABLET | Freq: Every day | ORAL | Status: DC

## 2017-11-12 MED ORDER — HEPARIN SOD (PORK) LOCK FLUSH 100 UNIT/ML IV SOLN
250.0000 [IU] | INTRAVENOUS | Status: AC | PRN
  Administered 2017-11-12: 250 [IU]

## 2017-11-12 MED ORDER — ENSURE ENLIVE PO LIQD: 237.0000 mL | Freq: Three times a day (TID) | ORAL | 12 refills | Status: DC

## 2017-11-12 MED ORDER — DEXTROSE 5 % IV SOLN: 2.0000 g | Freq: Three times a day (TID) | INTRAVENOUS | Status: DC

## 2017-11-12 MED ORDER — METHOCARBAMOL 500 MG PO TABS: 500.0000 mg | ORAL_TABLET | Freq: Three times a day (TID) | ORAL | 0 refills | Status: DC | PRN

## 2017-11-12 MED ORDER — LORAZEPAM 1 MG PO TABS: 1.0000 mg | ORAL_TABLET | Freq: Three times a day (TID) | ORAL | 0 refills | Status: DC | PRN

## 2017-11-12 MED ORDER — DOCUSATE SODIUM 100 MG PO CAPS: 100.0000 mg | ORAL_CAPSULE | Freq: Two times a day (BID) | ORAL | 0 refills | Status: DC | PRN

## 2017-11-12 MED ORDER — POLYETHYLENE GLYCOL 3350 17 G PO PACK: 17.0000 g | PACK | Freq: Every day | ORAL | 0 refills | Status: DC | PRN

## 2017-11-12 MED ORDER — ACETAMINOPHEN 325 MG PO TABS: 650.0000 mg | ORAL_TABLET | Freq: Four times a day (QID) | ORAL | Status: DC

## 2017-11-12 MED ORDER — CLOPIDOGREL BISULFATE 75 MG PO TABS: 75.0000 mg | ORAL_TABLET | Freq: Every day | ORAL | Status: DC

## 2017-11-12 NOTE — Clinical Social Work Placement (Signed)
Nurse to call report to [phone], Room 152  Transport to be set up for 12:00 PM    CLINICAL SOCIAL WORK PLACEMENT  NOTE  Date:  11/12/2017  Patient Details  Name: Dustin Marquez MRN: [id number] Date of Birth: 1981/01/06  Clinical Social Work is seeking post-discharge placement for this patient at the Skilled  Nursing Facility level of care (*CSW will initial, date and re-position this form in  chart as items are completed):  Yes   Patient/family provided with [facility] Clinical Social Work Department's list of facilities offering this level of care within the geographic area requested by the patient (or if unable, by the patient's family).  Yes   Patient/family informed of their freedom to choose among providers that offer the needed level of care, that participate in Medicare, Medicaid or managed care program needed by the patient, have an available bed and are willing to accept the patient.  Yes   Patient/family informed of [facility]'s ownership interest in [facility] and [facility], as well as of the fact that they are under no obligation to receive care at these facilities.  PASRR submitted to EDS on       PASRR number received on       Existing PASRR number confirmed on       FL2 transmitted to all facilities in geographic area requested by pt/family on       FL2 transmitted to all facilities within larger geographic area on       Patient informed that his/her managed care company has contracts with or will negotiate with certain facilities, including the following:            Patient/family informed of bed offers received.  Patient [facility] bed at [facility]     Physician recommends and patient chooses bed at      Patient to be transferred to [facility] on 11/12/17.  Patient to be transferred to facility by [facility]     Patient family notified on 11/12/17 of transfer.  Name of family member notified:  Dustin Marquez      PHYSICIAN       Additional Comment:    _______________________________________________ Baldemar LenisElizabeth M Nicholis Stepanek, LCSW 11/12/2017, 10:19 AM
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
## 2017-11-12 NOTE — Discharge Instructions (Signed)
Orthopedic surgery plan: -OK to start WBAT in boot, start slowly with therapy.  Ok for boot to be off while sleeping and dressing removed PRN.   -Discussed need to start weightbearing and the balance of possible plate failure but we hope weightbearing will stimulate healing.   -Follow up late December for repeat films if discharged.

## 2017-11-12 NOTE — Progress Notes (Signed)
Pt given discharge instructions and prescriptions. Carried out by EMS via Doctor, general practicestretcher. Pt has all of belongings with him.

## 2017-11-13 ENCOUNTER — Other Ambulatory Visit (HOSPITAL_COMMUNITY)
Admission: RE | Admit: 2017-11-13 | Discharge: 2017-11-13 | Disposition: A | Discharge status: Home / Self Care | Payer: Self-pay | Source: Skilled Nursing Facility | Attending: Internal Medicine | Admitting: Internal Medicine

## 2017-11-13 DIAGNOSIS — I63311 Cerebral infarction due to thrombosis of right middle cerebral artery: Secondary | ICD-10-CM | POA: Insufficient documentation

## 2017-11-13 LAB — COMPREHENSIVE METABOLIC PANEL
ALBUMIN: 3.7 g/dL (ref 3.5–5.0)
ALT: 30 U/L (ref 17–63)
AST: 23 U/L (ref 15–41)
Alkaline Phosphatase: 40 U/L (ref 38–126)
Anion gap: 8 (ref 5–15)
BILIRUBIN TOTAL: 0.7 mg/dL (ref 0.3–1.2)
BUN: 20 mg/dL (ref 6–20)
CALCIUM: 9.7 mg/dL (ref 8.9–10.3)
CO2: 28 mmol/L (ref 22–32)
CREATININE: 0.87 mg/dL (ref 0.61–1.24)
Chloride: 98 mmol/L — ABNORMAL LOW (ref 101–111)
Glucose, Bld: 125 mg/dL — ABNORMAL HIGH (ref 65–99)
Potassium: 3.9 mmol/L (ref 3.5–5.1)
SODIUM: 134 mmol/L — AB (ref 135–145)
Total Protein: 7.2 g/dL (ref 6.5–8.1)

## 2017-11-13 LAB — CBC
HEMATOCRIT: 39.7 % (ref 39.0–52.0)
Hemoglobin: 12.8 g/dL — ABNORMAL LOW (ref 13.0–17.0)
MCH: 29.2 pg (ref 26.0–34.0)
MCHC: 32.2 g/dL (ref 30.0–36.0)
MCV: 90.4 fL (ref 78.0–100.0)
Platelets: 181 10*3/uL (ref 150–400)
RBC: 4.39 MIL/uL (ref 4.22–5.81)
RDW: 16 % — AB (ref 11.5–15.5)
WBC: 6 10*3/uL (ref 4.0–10.5)

## 2017-11-15 ENCOUNTER — Other Ambulatory Visit: Payer: Self-pay

## 2017-11-15 MED ORDER — OXYCODONE HCL 5 MG PO TABS: 5.0000 mg | ORAL_TABLET | ORAL | 0 refills | Status: DC | PRN

## 2017-11-15 NOTE — Telephone Encounter (Signed)
RX Fax for [facility]@ [phone]  
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
## 2017-11-18 ENCOUNTER — Encounter: Payer: Self-pay | Admitting: Internal Medicine

## 2017-11-18 NOTE — Progress Notes (Signed)
Location:   [facility] Room Number: 128/P Place of Service:  SNF (31) Provider:  Anjali,Gupta  System, Pcp Not In  Patient Care Team: System, Pcp Not In as PCP - General Ricke Hey, MD (Family Medicine)  Extended Emergency Contact Information Primary Emergency Contact: Klimowicz,Christpher  [place] Phone: [phone] Relation: Brother Secondary Emergency Contact: [place] of [place] Phone: [phone] Relation: Brother  Code Status:  Full Code Goals of care: Advanced Directive information Advanced Directives 11/18/2017  Does Patient Have a Medical Advance Directive? Yes  Type of Advance Directive (No Data)  Does patient want to make changes to medical advance directive? No - Patient declined  Would patient like information on creating a medical advance directive? No - Patient declined  Pre-existing out of facility DNR order (yellow form or pink MOST form) -  Some encounter information is confidential and restricted. Go to Review Flowsheets activity to see all data.     Chief Complaint  Patient presents with  . Acute Visit    Patient c/o Pain Management    HPI:  Pt is a 36 y.o. male seen today for an acute visit for    Past Medical History:  Diagnosis Date  . Bipolar 2 disorder ([place])   . Depression   . Hypertension   . Jaw dislocation    Past Surgical History:  Procedure Laterality Date  . EXTERNAL FIXATION LEG Right 09/09/2017   Procedure: EXTERNAL FIXATION RIGHT LOWER LEG;  Surgeon: Hiram Gash, MD;  Location: [facility];  Service: Orthopedics;  Laterality: Right;  . FRACTURE SURGERY    . I&D EXTREMITY Right 10/02/2017   Procedure: IRRIGATION AND DEBRIDEMENT EXTREMITY;  Surgeon: Hiram Gash, MD;  Location: [facility];  Service: Orthopedics;  Laterality: Right;  . IR ANGIO EXTERNAL CAROTID SEL EXT CAROTID UNI L MOD SED  09/06/2017  . IR ANGIO INTRA EXTRACRAN SEL COM CAROTID INNOMINATE  BILAT MOD SED  09/06/2017  . IR ANGIO VERTEBRAL SEL SUBCLAVIAN INNOMINATE BILAT MOD SED  09/06/2017  . ORIF TIBIA FRACTURE Right 09/19/2017   Procedure: OPEN REDUCTION INTERNAL FIXATION (ORIF) TIBIA FRACTURE;  Surgeon: Hiram Gash, MD;  Location: [facility];  Service: Orthopedics;  Laterality: Right;    Allergies  Allergen Reactions  . Chlorhexidine Rash    CHG wipes in ICU caused rash that persisted until they were discontinued.  . Lamictal [Lamotrigine] Rash  . Lamictal [Lamotrigine] Rash    Outpatient Encounter Medications as of 11/18/2017  Medication Sig  . acetaminophen (TYLENOL) 325 MG tablet Take 2 tablets (650 mg total) every 6 (six) hours by mouth.  Marland Kitchen aspirin 81 MG chewable tablet Chew 1 tablet (81 mg total) daily by mouth.  . ceFEPime (MAXIPIME) IVPB Inject 2 g into the vein every 8 (eight) hours. Indication: Hardware associated osteomyelitis Last Day of Therapy:  11/28/2017 Labs - Once weekly:  CBC/D and BMP, Labs - Every other week:  ESR and CRP  . clopidogrel (PLAVIX) 75 MG tablet Take 1 tablet (75 mg total) daily by mouth.  . divalproex (DEPAKOTE) 500 MG DR tablet Take 2 tablets (1,000 mg total) every 12 (twelve) hours by mouth.  . docusate sodium (COLACE) 100 MG capsule Take 100 mg by mouth 2 (two) times daily as needed for mild constipation.  . feeding supplement, ENSURE ENLIVE, (ENSURE ENLIVE) LIQD Take 237 mLs 3 (three) times daily between meals by mouth.  Marland Kitchen LORazepam (ATIVAN) 1 MG tablet Take 1 tablet (  1 mg total) every 8 (eight) hours as needed by mouth for anxiety.  . methocarbamol (ROBAXIN) 500 MG tablet Take 1 tablet (500 mg total) every 8 (eight) hours as needed by mouth for muscle spasms.  . nicotine (NICODERM CQ - DOSED IN MG/24 HOURS) 14 mg/24hr patch Place 14 mg onto the skin daily.  Marland Kitchen oxyCODONE (OXY IR/ROXICODONE) 5 MG immediate release tablet Take 1 tablet (5 mg total) by mouth every 4 (four) hours as needed for moderate pain or severe pain.  . pantoprazole  (PROTONIX) 40 MG tablet Take 1 tablet (40 mg total) daily by mouth.  . polyethylene glycol (MIRALAX / GLYCOLAX) packet Take 17 g daily as needed by mouth.  . Sodium Chloride Flush (NORMAL SALINE FLUSH) 0.9 % SOLN Flush PICC with 5 cc normal saline after completion of IV ABO and then follow with Heparin Flush  Every 8 hours 11:00 PM, 07:00 AM, 03:00PM  . Sodium Chloride Flush (NORMAL SALINE FLUSH) 0.9 % SOLN Flush PICC with 5 cc normal saline prior to IV ABO administration every 8 hours 10:00PM,06:00AM, 02.:00PM  . [DISCONTINUED] ceFEPIme 2 g in dextrose 5 % 50 mL Inject 2 g every 8 (eight) hours for 16 days into the vein.  . [DISCONTINUED] divalproex (DEPAKOTE ER) 500 MG 24 hr tablet Take 1 tablet (500 mg total) by mouth 2 (two) times daily.  . [DISCONTINUED] docusate sodium (COLACE) 100 MG capsule Take 1 capsule (100 mg total) 2 (two) times daily as needed by mouth for mild constipation.  . [DISCONTINUED] ibuprofen (ADVIL,MOTRIN) 800 MG tablet Take 1 tablet (800 mg total) by mouth 3 (three) times daily.  . [DISCONTINUED] meloxicam (MOBIC) 7.5 MG tablet Take 1 tablet (7.5 mg total) by mouth daily.   No facility-administered encounter medications on file as of 11/18/2017.      Review of Systems  Immunization History  Administered Date(s) Administered  . Tdap 10/18/2014, 09/06/2017   Pertinent  Health Maintenance Due  Topic Date Due  . INFLUENZA VACCINE  11/23/2017 (Originally 07/24/2017)   No flowsheet data found. Functional Status Survey:    Vitals:   11/18/17 1407  BP: (!) 104/59  Pulse: 64  Resp: 20  Temp: 99 F (37.2 C)  TempSrc: Oral   There is no height or weight on file to calculate BMI. Physical Exam  Labs reviewed: Recent Labs    10/17/17 0456 10/28/17 0333 11/13/17 0900  NA 139 138 134*  K 4.1 3.9 3.9  CL 102 102 98*  CO2 '29 29 28  '$ GLUCOSE 102* 100* 125*  BUN 19 24* 20  CREATININE 0.75 0.87 0.87  CALCIUM 8.8* 8.8* 9.7   Recent Labs    09/09/17 0143  10/28/17 0333 11/13/17 0900  AST 86* 22 23  ALT 115* 24 30  ALKPHOS 36* 42 40  BILITOT 1.5* 0.6 0.7  PROT 5.7* 6.2* 7.2  ALBUMIN 2.7* 3.0* 3.7   Recent Labs    09/11/17 0623 09/13/17 1030 09/14/17 0359  10/17/17 0456 10/28/17 0333 11/13/17 0900  WBC 12.8* 10.0 10.4   < > 7.5 4.7 6.0  NEUTROABS 10.9* 7.6 7.8*  --   --   --   --   HGB 8.1* 9.3* 9.1*   < > 11.1* 11.2* 12.8*  HCT 25.0* 28.1* 27.9*   < > 33.7* 34.0* 39.7  MCV 89.9 90.1 88.9   < > 88.9 89.7 90.4  PLT 178 288 320   < > 276 226 181   < > = values in  this interval not displayed.   No results found for: TSH Lab Results  Component Value Date   HGBA1C 5.1 09/11/2017   Lab Results  Component Value Date   CHOL 70 09/11/2017   HDL 26 (L) 09/11/2017   LDLCALC 31 09/11/2017   TRIG 67 09/11/2017   CHOLHDL 2.7 09/11/2017    Significant Diagnostic Results in last 30 days:  Dg Cervical Spine 1 View  Result Date: 10/31/2017 CLINICAL DATA:  36 year old male with history of cervical fracture. EXAM: CERVICAL SPINE 1 VIEW COMPARISON:  09/18/2017 FINDINGS: 3-4 mm anterolisthesis of C6 on C7 with associated articular process fractures. No new fracture or spondylolisthesis identified. Prevertebral soft tissues unremarkable. IMPRESSION: 3-4 mm anterolisthesis of C6 on C7 with associated articular process fractures. This is likely grossly stable from prior examination given small variances in positioning. Electronically Signed   By: Kristopher Oppenheim M.D.   On: 10/31/2017 11:14   Dg Tibia/fibula Right  Result Date: 10/29/2017 CLINICAL DATA:  Postop external fixation of right lower leg fracture and internal fixation of right tibial fracture EXAM: RIGHT TIBIA AND FIBULA - 2 VIEW COMPARISON:  Right tibial on ankle films of 09/06/2017 FINDINGS: Plate and screw fixation of the distal right tibial fracture has been performed in near anatomic position and alignment. Healing fracture of the proximal right fibula is noted. No other acute  abnormality is seen. The ankle joint is unremarkable. IMPRESSION: 1. Plate and screw fixation of distal right tibial fracture. 2. Old healing fracture of the proximal right fibula. Electronically Signed   By: Ivar Drape M.D.   On: 10/29/2017 11:38   Dg Chest Port 1 View  Result Date: 11/12/2017 CLINICAL DATA:  Pain following trauma EXAM: PORTABLE CHEST 1 VIEW COMPARISON:  September 15, 2017 FINDINGS: Central catheter tip is in the superior vena cava. No pneumothorax. No edema or consolidation. The heart size is normal. Pulmonary vascularity is normal. No adenopathy. No pneumothorax. Previously noted left first rib fracture is not appreciable currently. IMPRESSION: No pneumothorax. No edema or consolidation. Heart size within normal limits. Central catheter tip in superior vena cava near cavoatrial junction. Electronically Signed   By: Lowella Grip III M.D.   On: 11/12/2017 09:15    Assessment/Plan There are no diagnoses linked to this encounter.   Family/ staff Communication:   Labs/tests ordered:    This encounter was created in error - please disregard.

## 2017-11-19 ENCOUNTER — Encounter: Payer: Self-pay | Admitting: Internal Medicine

## 2017-11-19 ENCOUNTER — Non-Acute Institutional Stay (SKILLED_NURSING_FACILITY): Payer: Medicaid Other | Admitting: Internal Medicine

## 2017-11-19 ENCOUNTER — Encounter (HOSPITAL_COMMUNITY)
Admission: RE | Admit: 2017-11-19 | Discharge: 2017-11-19 | Disposition: A | Discharge status: Home / Self Care | Payer: Self-pay | Source: Skilled Nursing Facility | Attending: Internal Medicine | Admitting: Internal Medicine

## 2017-11-19 DIAGNOSIS — T847XXS Infection and inflammatory reaction due to other internal orthopedic prosthetic devices, implants and grafts, sequela: Secondary | ICD-10-CM | POA: Diagnosis not present

## 2017-11-19 DIAGNOSIS — R52 Pain, unspecified: Secondary | ICD-10-CM

## 2017-11-19 DIAGNOSIS — S12530D Unspecified traumatic displaced spondylolisthesis of sixth cervical vertebra, subsequent encounter for fracture with routine healing: Secondary | ICD-10-CM | POA: Insufficient documentation

## 2017-11-19 DIAGNOSIS — Z5189 Encounter for other specified aftercare: Secondary | ICD-10-CM | POA: Insufficient documentation

## 2017-11-19 DIAGNOSIS — Z720 Tobacco use: Secondary | ICD-10-CM | POA: Insufficient documentation

## 2017-11-19 LAB — CBC WITH DIFFERENTIAL/PLATELET
Basophils Absolute: 0 10*3/uL (ref 0.0–0.1)
Basophils Relative: 1 %
Eosinophils Absolute: 0.5 10*3/uL (ref 0.0–0.7)
Eosinophils Relative: 8 %
HEMATOCRIT: 40.2 % (ref 39.0–52.0)
Hemoglobin: 12.7 g/dL — ABNORMAL LOW (ref 13.0–17.0)
LYMPHS PCT: 35 %
Lymphs Abs: 2 10*3/uL (ref 0.7–4.0)
MCH: 28.7 pg (ref 26.0–34.0)
MCHC: 31.6 g/dL (ref 30.0–36.0)
MCV: 90.7 fL (ref 78.0–100.0)
MONO ABS: 0.6 10*3/uL (ref 0.1–1.0)
MONOS PCT: 11 %
NEUTROS ABS: 2.6 10*3/uL (ref 1.7–7.7)
Neutrophils Relative %: 45 %
Platelets: 202 10*3/uL (ref 150–400)
RBC: 4.43 MIL/uL (ref 4.22–5.81)
RDW: 16.1 % — AB (ref 11.5–15.5)
WBC: 5.7 10*3/uL (ref 4.0–10.5)

## 2017-11-19 LAB — SEDIMENTATION RATE: Sed Rate: 1 mm/hr (ref 0–16)

## 2017-11-19 LAB — BASIC METABOLIC PANEL
Anion gap: 9 (ref 5–15)
BUN: 17 mg/dL (ref 6–20)
CALCIUM: 9.6 mg/dL (ref 8.9–10.3)
CO2: 28 mmol/L (ref 22–32)
CREATININE: 0.76 mg/dL (ref 0.61–1.24)
Chloride: 99 mmol/L — ABNORMAL LOW (ref 101–111)
GFR calc Af Amer: >60 mL/min (ref >60)
GFR calc non Af Amer: >60 mL/min (ref >60)
GLUCOSE: 93 mg/dL (ref 65–99)
Potassium: 3.9 mmol/L (ref 3.5–5.1)
Sodium: 136 mmol/L (ref 135–145)

## 2017-11-19 LAB — C-REACTIVE PROTEIN: CRP: <0.8 mg/dL (ref <1.0)
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
## 2017-11-20 ENCOUNTER — Encounter (HOSPITAL_COMMUNITY)
Admission: RE | Admit: 2017-11-20 | Discharge: 2017-11-20 | Disposition: A | Discharge status: Home / Self Care | Payer: Self-pay | Source: Skilled Nursing Facility | Attending: Internal Medicine | Admitting: Internal Medicine

## 2017-11-20 DIAGNOSIS — Z720 Tobacco use: Secondary | ICD-10-CM | POA: Insufficient documentation

## 2017-11-20 DIAGNOSIS — Z5189 Encounter for other specified aftercare: Secondary | ICD-10-CM | POA: Insufficient documentation

## 2017-11-20 DIAGNOSIS — S12530D Unspecified traumatic displaced spondylolisthesis of sixth cervical vertebra, subsequent encounter for fracture with routine healing: Secondary | ICD-10-CM | POA: Insufficient documentation

## 2017-11-26 ENCOUNTER — Encounter (HOSPITAL_COMMUNITY)
Admission: RE | Admit: 2017-11-26 | Discharge: 2017-11-26 | Disposition: A | Discharge status: Home / Self Care | Payer: Medicaid Other | Source: Skilled Nursing Facility | Attending: Internal Medicine | Admitting: Internal Medicine

## 2017-11-26 DIAGNOSIS — S82201D Unspecified fracture of shaft of right tibia, subsequent encounter for closed fracture with routine healing: Secondary | ICD-10-CM | POA: Insufficient documentation

## 2017-11-26 LAB — BASIC METABOLIC PANEL
Anion gap: 7 (ref 5–15)
BUN: 22 mg/dL — AB (ref 6–20)
CALCIUM: 9.5 mg/dL (ref 8.9–10.3)
CHLORIDE: 103 mmol/L (ref 101–111)
CO2: 29 mmol/L (ref 22–32)
CREATININE: 0.78 mg/dL (ref 0.61–1.24)
GFR calc Af Amer: >60 mL/min (ref >60)
GFR calc non Af Amer: >60 mL/min (ref >60)
GLUCOSE: 92 mg/dL (ref 65–99)
Potassium: 4 mmol/L (ref 3.5–5.1)
Sodium: 139 mmol/L (ref 135–145)

## 2017-11-26 LAB — CBC WITH DIFFERENTIAL/PLATELET
Basophils Absolute: 0 10*3/uL (ref 0.0–0.1)
Basophils Relative: 1 %
EOS ABS: 0.5 10*3/uL (ref 0.0–0.7)
EOS PCT: 11 %
HCT: 40.7 % (ref 39.0–52.0)
Hemoglobin: 12.9 g/dL — ABNORMAL LOW (ref 13.0–17.0)
LYMPHS ABS: 1.9 10*3/uL (ref 0.7–4.0)
LYMPHS PCT: 38 %
MCH: 29 pg (ref 26.0–34.0)
MCHC: 31.7 g/dL (ref 30.0–36.0)
MCV: 91.5 fL (ref 78.0–100.0)
MONO ABS: 0.5 10*3/uL (ref 0.1–1.0)
Monocytes Relative: 11 %
Neutro Abs: 2 10*3/uL (ref 1.7–7.7)
Neutrophils Relative %: 39 %
PLATELETS: 194 10*3/uL (ref 150–400)
RBC: 4.45 MIL/uL (ref 4.22–5.81)
RDW: 16.2 % — AB (ref 11.5–15.5)
WBC: 4.9 10*3/uL (ref 4.0–10.5)

## 2017-11-26 LAB — SEDIMENTATION RATE: Sed Rate: 1 mm/hr (ref 0–16)

## 2017-11-26 LAB — C-REACTIVE PROTEIN

## 2017-11-28 ENCOUNTER — Ambulatory Visit (INDEPENDENT_AMBULATORY_CARE_PROVIDER_SITE_OTHER): Payer: Self-pay | Admitting: Infectious Disease

## 2017-11-28 ENCOUNTER — Inpatient Hospital Stay: Payer: Self-pay | Admitting: Infectious Disease

## 2017-11-28 ENCOUNTER — Encounter: Payer: Self-pay | Admitting: Infectious Disease

## 2017-11-28 VITALS — BP 120/69 | HR 77 | Temp 98.3°F

## 2017-11-28 DIAGNOSIS — A498 Other bacterial infections of unspecified site: Secondary | ICD-10-CM

## 2017-11-28 DIAGNOSIS — S82201K Unspecified fracture of shaft of right tibia, subsequent encounter for closed fracture with nonunion: Secondary | ICD-10-CM

## 2017-11-28 DIAGNOSIS — B965 Pseudomonas (aeruginosa) (mallei) (pseudomallei) as the cause of diseases classified elsewhere: Secondary | ICD-10-CM

## 2017-11-28 DIAGNOSIS — I7771 Dissection of carotid artery: Secondary | ICD-10-CM

## 2017-11-28 DIAGNOSIS — M86161 Other acute osteomyelitis, right tibia and fibula: Secondary | ICD-10-CM

## 2017-11-28 DIAGNOSIS — I63411 Cerebral infarction due to embolism of right middle cerebral artery: Secondary | ICD-10-CM

## 2017-11-28 DIAGNOSIS — F1120 Opioid dependence, uncomplicated: Secondary | ICD-10-CM

## 2017-11-28 DIAGNOSIS — G936 Cerebral edema: Secondary | ICD-10-CM

## 2017-11-28 DIAGNOSIS — S82401K Unspecified fracture of shaft of right fibula, subsequent encounter for closed fracture with nonunion: Secondary | ICD-10-CM

## 2017-11-28 DIAGNOSIS — T847XXS Infection and inflammatory reaction due to other internal orthopedic prosthetic devices, implants and grafts, sequela: Secondary | ICD-10-CM

## 2017-11-28 DIAGNOSIS — S12500S Unspecified displaced fracture of sixth cervical vertebra, sequela: Secondary | ICD-10-CM

## 2017-11-28 HISTORY — DX: Opioid dependence, uncomplicated: F11.20
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
## 2017-11-28 NOTE — Patient Instructions (Signed)
You will need to remain on IV antibiotics (cefepime) for several months so that the bone can heal tot the point where Dr. Everardo PacificVarkey can remove the metal hardware and we can have a chance to cure this bone infection  Unfortunately there are NO (zero) active oral antibiotic options that I can use, so we are stuck with IV antibiotics that we need to use to control and cure this infection  YOu are doing DRAMATICALLY Better!   If your SNF [facility] feels you are suitable for DC PRIOR TO that point arrangements should be made for [facility] to be set up at for example your Aunt's home (if this is feasble)

## 2017-12-03 ENCOUNTER — Encounter (HOSPITAL_COMMUNITY)
Admission: RE | Admit: 2017-12-03 | Discharge: 2017-12-03 | Disposition: A | Discharge status: Home / Self Care | Payer: Medicaid Other | Source: Skilled Nursing Facility | Attending: *Deleted | Admitting: *Deleted

## 2017-12-03 DIAGNOSIS — S82201D Unspecified fracture of shaft of right tibia, subsequent encounter for closed fracture with routine healing: Secondary | ICD-10-CM | POA: Diagnosis not present

## 2017-12-03 LAB — BASIC METABOLIC PANEL
Anion gap: 6 (ref 5–15)
BUN: 21 mg/dL — ABNORMAL HIGH (ref 6–20)
CHLORIDE: 101 mmol/L (ref 101–111)
CO2: 28 mmol/L (ref 22–32)
CREATININE: 0.96 mg/dL (ref 0.61–1.24)
Calcium: 8.8 mg/dL — ABNORMAL LOW (ref 8.9–10.3)
GFR calc non Af Amer: >60 mL/min (ref >60)
Glucose, Bld: 118 mg/dL — ABNORMAL HIGH (ref 65–99)
POTASSIUM: 3.9 mmol/L (ref 3.5–5.1)
SODIUM: 135 mmol/L (ref 135–145)

## 2017-12-03 LAB — CBC WITH DIFFERENTIAL/PLATELET
BASOS ABS: 0 10*3/uL (ref 0.0–0.1)
BASOS PCT: 0 %
EOS ABS: 0.7 10*3/uL (ref 0.0–0.7)
EOS PCT: 13 %
HCT: 36.8 % — ABNORMAL LOW (ref 39.0–52.0)
Hemoglobin: 11.8 g/dL — ABNORMAL LOW (ref 13.0–17.0)
LYMPHS ABS: 2 10*3/uL (ref 0.7–4.0)
LYMPHS PCT: 38 %
MCH: 29.6 pg (ref 26.0–34.0)
MCHC: 32.1 g/dL (ref 30.0–36.0)
MCV: 92.5 fL (ref 78.0–100.0)
MONO ABS: 0.3 10*3/uL (ref 0.1–1.0)
MONOS PCT: 5 %
NEUTROS PCT: 44 %
Neutro Abs: 2.3 10*3/uL (ref 1.7–7.7)
PLATELETS: 197 10*3/uL (ref 150–400)
RBC: 3.98 MIL/uL — ABNORMAL LOW (ref 4.22–5.81)
RDW: 16.4 % — AB (ref 11.5–15.5)
WBC: 5.2 10*3/uL (ref 4.0–10.5)

## 2017-12-03 LAB — C-REACTIVE PROTEIN: CRP: <0.8 mg/dL (ref <1.0)

## 2017-12-03 LAB — SEDIMENTATION RATE: SED RATE: 1 mm/h (ref 0–16)

## 2017-12-04 ENCOUNTER — Non-Acute Institutional Stay (SKILLED_NURSING_FACILITY): Payer: Medicaid Other | Admitting: Internal Medicine

## 2017-12-04 ENCOUNTER — Encounter: Payer: Self-pay | Admitting: Internal Medicine

## 2017-12-04 DIAGNOSIS — Z8781 Personal history of (healed) traumatic fracture: Secondary | ICD-10-CM | POA: Diagnosis not present

## 2017-12-04 DIAGNOSIS — T847XXS Infection and inflammatory reaction due to other internal orthopedic prosthetic devices, implants and grafts, sequela: Secondary | ICD-10-CM | POA: Diagnosis not present

## 2017-12-04 DIAGNOSIS — R52 Pain, unspecified: Secondary | ICD-10-CM

## 2017-12-04 DIAGNOSIS — I63411 Cerebral infarction due to embolism of right middle cerebral artery: Secondary | ICD-10-CM
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
## 2017-12-06 ENCOUNTER — Other Ambulatory Visit: Payer: Self-pay

## 2017-12-06 MED ORDER — OXYCODONE HCL 5 MG PO TABS: 5.0000 mg | ORAL_TABLET | Freq: Two times a day (BID) | ORAL | 0 refills | Status: DC

## 2017-12-06 MED ORDER — OXYCODONE HCL 15 MG PO TABS: ORAL_TABLET | ORAL | 0 refills | Status: DC

## 2017-12-06 NOTE — Telephone Encounter (Signed)
RX Fax for [facility]@ [phone]  

## 2017-12-10 ENCOUNTER — Telehealth: Payer: Self-pay

## 2017-12-10 ENCOUNTER — Other Ambulatory Visit (HOSPITAL_COMMUNITY)
Admission: RE | Admit: 2017-12-10 | Discharge: 2017-12-10 | Disposition: A | Discharge status: Home / Self Care | Payer: Medicaid Other | Source: Skilled Nursing Facility | Attending: Internal Medicine | Admitting: Internal Medicine

## 2017-12-10 DIAGNOSIS — S12530D Unspecified traumatic displaced spondylolisthesis of sixth cervical vertebra, subsequent encounter for fracture with routine healing: Secondary | ICD-10-CM | POA: Diagnosis not present

## 2017-12-10 LAB — BASIC METABOLIC PANEL
Anion gap: 11 (ref 5–15)
BUN: 23 mg/dL — AB (ref 6–20)
CALCIUM: 9.7 mg/dL (ref 8.9–10.3)
CO2: 27 mmol/L (ref 22–32)
CREATININE: 0.79 mg/dL (ref 0.61–1.24)
Chloride: 101 mmol/L (ref 101–111)
GFR calc Af Amer: >60 mL/min (ref >60)
GFR calc non Af Amer: >60 mL/min (ref >60)
GLUCOSE: 119 mg/dL — AB (ref 65–99)
POTASSIUM: 3.9 mmol/L (ref 3.5–5.1)
SODIUM: 139 mmol/L (ref 135–145)

## 2017-12-10 LAB — SEDIMENTATION RATE: Sed Rate: 3 mm/hr (ref 0–16)

## 2017-12-10 LAB — CBC WITH DIFFERENTIAL/PLATELET
BASOS ABS: 0.1 10*3/uL (ref 0.0–0.1)
Basophils Relative: 1 %
EOS ABS: 0.9 10*3/uL — AB (ref 0.0–0.7)
EOS PCT: 17 %
HCT: 41.8 % (ref 39.0–52.0)
Hemoglobin: 13.6 g/dL (ref 13.0–17.0)
LYMPHS PCT: 35 %
Lymphs Abs: 1.9 10*3/uL (ref 0.7–4.0)
MCH: 29.6 pg (ref 26.0–34.0)
MCHC: 32.5 g/dL (ref 30.0–36.0)
MCV: 90.9 fL (ref 78.0–100.0)
MONO ABS: 0.6 10*3/uL (ref 0.1–1.0)
Monocytes Relative: 10 %
Neutro Abs: 2 10*3/uL (ref 1.7–7.7)
Neutrophils Relative %: 37 %
PLATELETS: 220 10*3/uL (ref 150–400)
RBC: 4.6 MIL/uL (ref 4.22–5.81)
RDW: 16.5 % — AB (ref 11.5–15.5)
WBC: 5.4 10*3/uL (ref 4.0–10.5)

## 2017-12-10 LAB — C-REACTIVE PROTEIN

## 2017-12-10 NOTE — Telephone Encounter (Signed)
There is NO stop date. It is contigent upon hadrware removal. You can give them a date of April 1st if they need an arbitrary date

## 2017-12-10 NOTE — Telephone Encounter (Signed)
Since note are not specific

## 2017-12-10 NOTE — Telephone Encounter (Signed)
[facility] calling regarding IV antibiotics.  Order needed for stop date or at least an estimated end  date.    Please advise.   Fax orders to [facility]  Attn: Tamela Oddienae   [phone]

## 2017-12-11 ENCOUNTER — Encounter: Payer: Self-pay | Admitting: Internal Medicine

## 2017-12-11 ENCOUNTER — Non-Acute Institutional Stay (SKILLED_NURSING_FACILITY): Payer: Medicaid Other | Admitting: Internal Medicine

## 2017-12-11 DIAGNOSIS — S82401K Unspecified fracture of shaft of right fibula, subsequent encounter for closed fracture with nonunion: Secondary | ICD-10-CM

## 2017-12-11 DIAGNOSIS — F314 Bipolar disorder, current episode depressed, severe, without psychotic features: Secondary | ICD-10-CM | POA: Diagnosis not present

## 2017-12-11 DIAGNOSIS — Z8781 Personal history of (healed) traumatic fracture: Secondary | ICD-10-CM

## 2017-12-11 DIAGNOSIS — T847XXS Infection and inflammatory reaction due to other internal orthopedic prosthetic devices, implants and grafts, sequela: Secondary | ICD-10-CM

## 2017-12-11 DIAGNOSIS — S82201K Unspecified fracture of shaft of right tibia, subsequent encounter for closed fracture with nonunion: Secondary | ICD-10-CM

## 2017-12-11 NOTE — Telephone Encounter (Signed)
Ok very good thanks! 

## 2017-12-11 NOTE — Telephone Encounter (Signed)
Dustin Marquez at [facility] advised of MD's response. Wendall MolaJacqueline Philopater Mucha

## 2017-12-11 NOTE — Telephone Encounter (Signed)
See MD note please
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
## 2017-12-11 NOTE — Telephone Encounter (Signed)
Order was sent to continue IV antibiotics for two months until further notice on 12-10-17

## 2017-12-12 NOTE — Telephone Encounter (Signed)
Thanks Tammy I believe he also has appt with me in interim. I need his orthopedist to take his hardware out before we can give him a final course of abx --there are not oral options to bridge him in interim

## 2017-12-13 ENCOUNTER — Telehealth: Payer: Self-pay | Admitting: Neurology

## 2017-12-13 NOTE — Telephone Encounter (Addendum)
error 

## 2017-12-25 ENCOUNTER — Telehealth: Payer: Self-pay | Admitting: *Deleted

## 2017-12-25 NOTE — Telephone Encounter (Signed)
Alexis RN from [facility] called stating she has been unable to locate the patient for his IV medication treatment. The last time she saw him was a week ago and the Aunt that he was staying with stated she kicked him out. Patient did take his medication with him. All of his phone numbers were verified and patient has an appt with Dr. Daiva EvesVan Dam on 12/26/17. Nurse stated that patient is aware of this appt. Please advise, patient does have a picc line.

## 2017-12-25 NOTE — Telephone Encounter (Signed)
NOTE this patient has a SEVERE cognitive impairment and was supposed to be staying in a SNF I have NO IDEA why he was DC. I think that was not a wise choice. This is a very bad sign. There are NO oral antibiotic options. I would try to get him admitted to ED somehow and have use see him as inpatient and try to DC to SNF again

## 2017-12-26 ENCOUNTER — Ambulatory Visit: Payer: Self-pay | Admitting: Infectious Disease

## 2018-01-14 ENCOUNTER — Ambulatory Visit: Payer: Self-pay | Admitting: Neurology

## 2018-01-14 ENCOUNTER — Telehealth: Payer: Self-pay

## 2018-01-14 NOTE — Telephone Encounter (Signed)
Patient no show for appt today. 

## 2018-01-15 ENCOUNTER — Encounter: Payer: Self-pay | Admitting: Neurology

## 2018-01-24 ENCOUNTER — Emergency Department (HOSPITAL_COMMUNITY)
Admission: EM | Admit: 2018-01-24 | Discharge: 2018-01-24 | Disposition: A | Discharge status: Home / Self Care | Payer: Self-pay | Attending: Emergency Medicine | Admitting: Emergency Medicine

## 2018-01-24 ENCOUNTER — Other Ambulatory Visit: Payer: Self-pay

## 2018-01-24 ENCOUNTER — Encounter (HOSPITAL_COMMUNITY): Payer: Self-pay

## 2018-01-24 DIAGNOSIS — Z7982 Long term (current) use of aspirin: Secondary | ICD-10-CM | POA: Insufficient documentation

## 2018-01-24 DIAGNOSIS — I10 Essential (primary) hypertension: Secondary | ICD-10-CM | POA: Insufficient documentation

## 2018-01-24 DIAGNOSIS — F17210 Nicotine dependence, cigarettes, uncomplicated: Secondary | ICD-10-CM | POA: Insufficient documentation

## 2018-01-24 DIAGNOSIS — Z452 Encounter for adjustment and management of vascular access device: Secondary | ICD-10-CM | POA: Insufficient documentation

## 2018-01-24 LAB — CBC WITH DIFFERENTIAL/PLATELET
BASOS PCT: 0 %
Basophils Absolute: 0 10*3/uL (ref 0.0–0.1)
Eosinophils Absolute: 0.1 10*3/uL (ref 0.0–0.7)
Eosinophils Relative: 2 %
HEMATOCRIT: 49 % (ref 39.0–52.0)
Hemoglobin: 16.5 g/dL (ref 13.0–17.0)
Lymphocytes Relative: 35 %
Lymphs Abs: 2 10*3/uL (ref 0.7–4.0)
MCH: 30.6 pg (ref 26.0–34.0)
MCHC: 33.7 g/dL (ref 30.0–36.0)
MCV: 90.9 fL (ref 78.0–100.0)
MONO ABS: 0.4 10*3/uL (ref 0.1–1.0)
MONOS PCT: 7 %
NEUTROS ABS: 3.1 10*3/uL (ref 1.7–7.7)
Neutrophils Relative %: 56 %
Platelets: 244 10*3/uL (ref 150–400)
RBC: 5.39 MIL/uL (ref 4.22–5.81)
RDW: 14.3 % (ref 11.5–15.5)
WBC: 5.6 10*3/uL (ref 4.0–10.5)

## 2018-01-24 LAB — SEDIMENTATION RATE: Sed Rate: 0 mm/hr (ref 0–16)

## 2018-01-24 LAB — C-REACTIVE PROTEIN

## 2018-01-24 NOTE — ED Notes (Signed)
2 attempts for 2nd set of blood culture unsuccessful
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
## 2018-01-24 NOTE — ED Notes (Signed)
Patient states that he finished antibiotics on Jan 6th and was told to go to ED or PCP for PICC line removal. This RN called [facility] and [facility] and they discharged him on Jan 9th due to not being able to get a hold of patient to finish his antibiotics treatments.  Made Hope NP aware, patient needs to be seen for further work up to ensure infection is resolved before removing PICC line.

## 2018-01-24 NOTE — ED Notes (Signed)
Bed: WLPT1 Expected date:  Expected time:  Means of arrival:  Comments: 

## 2018-01-24 NOTE — ED Triage Notes (Signed)
Pt requesting that we remove his PICC line. He reports that he has finished his course of antibiotics and it is itchy and annoying. He denies any pain, redness, discharge, or swelling at site. A&Ox4. Ambulatory.

## 2018-01-29 LAB — CULTURE, BLOOD (ROUTINE X 2)
Culture: NO GROWTH
SPECIAL REQUESTS: ADEQUATE

## 2018-02-10 ENCOUNTER — Inpatient Hospital Stay: Payer: Self-pay | Admitting: Infectious Disease

## 2018-05-22 ENCOUNTER — Encounter (HOSPITAL_COMMUNITY): Payer: Self-pay | Admitting: Emergency Medicine

## 2018-05-22 ENCOUNTER — Other Ambulatory Visit: Payer: Self-pay

## 2018-05-22 DIAGNOSIS — M25571 Pain in right ankle and joints of right foot: Secondary | ICD-10-CM | POA: Insufficient documentation

## 2018-05-22 DIAGNOSIS — I10 Essential (primary) hypertension: Secondary | ICD-10-CM | POA: Diagnosis not present

## 2018-05-22 DIAGNOSIS — G8929 Other chronic pain: Secondary | ICD-10-CM | POA: Insufficient documentation

## 2018-05-22 DIAGNOSIS — F17210 Nicotine dependence, cigarettes, uncomplicated: Secondary | ICD-10-CM | POA: Diagnosis not present

## 2018-05-22 DIAGNOSIS — Z7982 Long term (current) use of aspirin: Secondary | ICD-10-CM | POA: Insufficient documentation

## 2018-05-22 DIAGNOSIS — Z79899 Other long term (current) drug therapy: Secondary | ICD-10-CM | POA: Insufficient documentation

## 2018-05-22 DIAGNOSIS — Z7901 Long term (current) use of anticoagulants: Secondary | ICD-10-CM | POA: Insufficient documentation
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
## 2018-05-23 ENCOUNTER — Emergency Department (HOSPITAL_COMMUNITY)
Admission: EM | Admit: 2018-05-23 | Discharge: 2018-05-23 | Disposition: A | Discharge status: Home / Self Care | Payer: Medicaid Other | Attending: Emergency Medicine | Admitting: Emergency Medicine

## 2018-05-23 ENCOUNTER — Emergency Department (HOSPITAL_COMMUNITY): Payer: Medicaid Other

## 2018-05-23 DIAGNOSIS — M25571 Pain in right ankle and joints of right foot: Secondary | ICD-10-CM

## 2018-05-23 DIAGNOSIS — G8929 Other chronic pain: Secondary | ICD-10-CM

## 2018-05-23 MED ORDER — NAPROXEN 500 MG PO TABS: 500.0000 mg | ORAL_TABLET | Freq: Two times a day (BID) | ORAL | 0 refills | Status: DC

## 2018-05-23 MED ORDER — NAPROXEN 500 MG PO TABS
500.0000 mg | ORAL_TABLET | Freq: Once | ORAL | Status: AC
  Administered 2018-05-23: 500 mg via ORAL
  Filled 2018-05-23: qty 1

## 2018-05-23 NOTE — ED Notes (Signed)
Ankle splint applied to R ankle, pt instructed on caring for and reapplying the splint, verbalized understanding. Pt states minimal pain.
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
## 2018-05-23 NOTE — Discharge Instructions (Signed)
Medications: Naprosyn  Treatment: Take Naprosyn twice daily for your pain.  You can alternate with Tylenol.  Use ice 3-4 times daily alternating 20 minutes on, 20 minutes off.  Elevate your leg whenever you are not walking on it.  Wear brace for the next 3 to 5 days to help support.  Follow-up: Please follow-up with Dr. Everardo Pacific for further evaluation and treatment of your symptoms.  Please return to the emergency department if you develop any new or worsening symptoms including fever or significant swelling or redness of your ankle.

## 2018-05-24 ENCOUNTER — Emergency Department (HOSPITAL_COMMUNITY)
Admission: EM | Admit: 2018-05-24 | Discharge: 2018-05-24 | Disposition: A | Discharge status: Home / Self Care | Payer: Medicaid Other | Attending: Emergency Medicine | Admitting: Emergency Medicine

## 2018-05-24 ENCOUNTER — Encounter (HOSPITAL_COMMUNITY): Payer: Self-pay | Admitting: Nurse Practitioner

## 2018-05-24 DIAGNOSIS — Z7982 Long term (current) use of aspirin: Secondary | ICD-10-CM | POA: Insufficient documentation

## 2018-05-24 DIAGNOSIS — F1920 Other psychoactive substance dependence, uncomplicated: Secondary | ICD-10-CM | POA: Insufficient documentation

## 2018-05-24 DIAGNOSIS — F17210 Nicotine dependence, cigarettes, uncomplicated: Secondary | ICD-10-CM | POA: Insufficient documentation

## 2018-05-24 DIAGNOSIS — Y999 Unspecified external cause status: Secondary | ICD-10-CM | POA: Insufficient documentation

## 2018-05-24 DIAGNOSIS — I10 Essential (primary) hypertension: Secondary | ICD-10-CM | POA: Diagnosis not present

## 2018-05-24 DIAGNOSIS — Y939 Activity, unspecified: Secondary | ICD-10-CM | POA: Diagnosis not present

## 2018-05-24 DIAGNOSIS — Y33XXXA Other specified events, undetermined intent, initial encounter: Secondary | ICD-10-CM | POA: Insufficient documentation

## 2018-05-24 DIAGNOSIS — M25571 Pain in right ankle and joints of right foot: Secondary | ICD-10-CM | POA: Insufficient documentation

## 2018-05-24 DIAGNOSIS — Y929 Unspecified place or not applicable: Secondary | ICD-10-CM | POA: Insufficient documentation

## 2018-05-24 DIAGNOSIS — S99911A Unspecified injury of right ankle, initial encounter: Secondary | ICD-10-CM | POA: Diagnosis present

## 2018-05-24 NOTE — ED Triage Notes (Signed)
Pt is requesting we help him get in with his orthopedic surgeon to have the hardware in his right shin and ankle removed.
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
## 2018-05-24 NOTE — Discharge Instructions (Signed)
Call Dr. Austin MilesVarkey's office first thing Monday morning to set up a follow-up appointment to planned surgery.  Return to the emergency department if any concerning signs or symptoms develop.

## 2018-05-25 ENCOUNTER — Encounter (HOSPITAL_COMMUNITY): Payer: Self-pay | Admitting: Emergency Medicine

## 2018-05-25 ENCOUNTER — Emergency Department (HOSPITAL_COMMUNITY)
Admission: EM | Admit: 2018-05-25 | Discharge: 2018-05-25 | Disposition: A | Discharge status: Home / Self Care | Payer: Medicaid Other | Attending: Emergency Medicine | Admitting: Emergency Medicine

## 2018-05-25 DIAGNOSIS — G8929 Other chronic pain: Secondary | ICD-10-CM | POA: Diagnosis not present

## 2018-05-25 DIAGNOSIS — F1120 Opioid dependence, uncomplicated: Secondary | ICD-10-CM | POA: Insufficient documentation

## 2018-05-25 DIAGNOSIS — I10 Essential (primary) hypertension: Secondary | ICD-10-CM | POA: Diagnosis not present

## 2018-05-25 DIAGNOSIS — Z7982 Long term (current) use of aspirin: Secondary | ICD-10-CM | POA: Insufficient documentation

## 2018-05-25 DIAGNOSIS — F1410 Cocaine abuse, uncomplicated: Secondary | ICD-10-CM | POA: Diagnosis not present

## 2018-05-25 DIAGNOSIS — F319 Bipolar disorder, unspecified: Secondary | ICD-10-CM | POA: Diagnosis not present

## 2018-05-25 DIAGNOSIS — M25571 Pain in right ankle and joints of right foot: Secondary | ICD-10-CM | POA: Diagnosis not present

## 2018-05-25 DIAGNOSIS — Z79899 Other long term (current) drug therapy: Secondary | ICD-10-CM | POA: Insufficient documentation

## 2018-05-25 DIAGNOSIS — F17210 Nicotine dependence, cigarettes, uncomplicated: Secondary | ICD-10-CM | POA: Diagnosis not present

## 2018-05-25 MED ORDER — NAPROXEN 250 MG PO TABS
500.0000 mg | ORAL_TABLET | Freq: Once | ORAL | Status: AC
  Administered 2018-05-25: 500 mg via ORAL
  Filled 2018-05-25: qty 2

## 2018-05-25 NOTE — Discharge Instructions (Addendum)
Please read instructions below. Apply ice to your ankle for 20 minutes at a time. Elevate it when possible. You can take the naproxen every 12 hours as needed for pain. Schedule an appointment with your orthopedic specialist, Dr. Everardo PacificVarkey, to discuss persistent pain.  The ER will not be able to schedule your for a new surgery to your ankle. We recommend you be re-evaluated by Dr. Everardo PacificVarkey for his recommendations.
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
## 2018-06-19 ENCOUNTER — Encounter (HOSPITAL_COMMUNITY): Payer: Self-pay | Admitting: Emergency Medicine

## 2018-06-19 ENCOUNTER — Emergency Department (HOSPITAL_COMMUNITY): Payer: Medicaid Other

## 2018-06-19 ENCOUNTER — Other Ambulatory Visit: Payer: Self-pay

## 2018-06-19 ENCOUNTER — Emergency Department (HOSPITAL_COMMUNITY)
Admission: EM | Admit: 2018-06-19 | Discharge: 2018-06-19 | Disposition: A | Discharge status: Home / Self Care | Payer: Medicaid Other | Attending: Emergency Medicine | Admitting: Emergency Medicine

## 2018-06-19 DIAGNOSIS — I10 Essential (primary) hypertension: Secondary | ICD-10-CM | POA: Diagnosis not present

## 2018-06-19 DIAGNOSIS — M25571 Pain in right ankle and joints of right foot: Secondary | ICD-10-CM

## 2018-06-19 DIAGNOSIS — S91001A Unspecified open wound, right ankle, initial encounter: Secondary | ICD-10-CM

## 2018-06-19 DIAGNOSIS — F17210 Nicotine dependence, cigarettes, uncomplicated: Secondary | ICD-10-CM | POA: Insufficient documentation

## 2018-06-19 DIAGNOSIS — Z79899 Other long term (current) drug therapy: Secondary | ICD-10-CM | POA: Diagnosis not present

## 2018-06-19 LAB — CBC WITH DIFFERENTIAL/PLATELET
Abs Immature Granulocytes: 0 10*3/uL (ref 0.0–0.1)
BASOS ABS: 0 10*3/uL (ref 0.0–0.1)
Basophils Relative: 0 %
EOS ABS: 0.1 10*3/uL (ref 0.0–0.7)
EOS PCT: 1 %
HEMATOCRIT: 44.6 % (ref 39.0–52.0)
Hemoglobin: 13.9 g/dL (ref 13.0–17.0)
Immature Granulocytes: 0 %
LYMPHS ABS: 2 10*3/uL (ref 0.7–4.0)
Lymphocytes Relative: 27 %
MCH: 28.4 pg (ref 26.0–34.0)
MCHC: 31.2 g/dL (ref 30.0–36.0)
MCV: 91 fL (ref 78.0–100.0)
Monocytes Absolute: 0.6 10*3/uL (ref 0.1–1.0)
Monocytes Relative: 8 %
Neutro Abs: 4.7 10*3/uL (ref 1.7–7.7)
Neutrophils Relative %: 64 %
Platelets: 259 10*3/uL (ref 150–400)
RBC: 4.9 MIL/uL (ref 4.22–5.81)
RDW: 15.5 % (ref 11.5–15.5)
WBC: 7.4 10*3/uL (ref 4.0–10.5)

## 2018-06-19 LAB — COMPREHENSIVE METABOLIC PANEL
ALK PHOS: 33 U/L — AB (ref 38–126)
ALT: 7 U/L (ref 0–44)
AST: 20 U/L (ref 15–41)
Albumin: 4 g/dL (ref 3.5–5.0)
Anion gap: 9 (ref 5–15)
BILIRUBIN TOTAL: 1 mg/dL (ref 0.3–1.2)
BUN: 11 mg/dL (ref 6–20)
CO2: 27 mmol/L (ref 22–32)
CREATININE: 1 mg/dL (ref 0.61–1.24)
Calcium: 9.2 mg/dL (ref 8.9–10.3)
Chloride: 106 mmol/L (ref 98–111)
GFR calc Af Amer: >60 mL/min (ref >60)
Glucose, Bld: 87 mg/dL (ref 70–99)
Potassium: 4.3 mmol/L (ref 3.5–5.1)
Sodium: 142 mmol/L (ref 135–145)
Total Protein: 7 g/dL (ref 6.5–8.1)

## 2018-06-19 LAB — URINALYSIS, ROUTINE W REFLEX MICROSCOPIC
BILIRUBIN URINE: NEGATIVE
Glucose, UA: NEGATIVE mg/dL
Hgb urine dipstick: NEGATIVE
KETONES UR: NEGATIVE mg/dL
Leukocytes, UA: NEGATIVE
NITRITE: NEGATIVE
PH: 6 (ref 5.0–8.0)
Protein, ur: NEGATIVE mg/dL
Specific Gravity, Urine: 1.024 (ref 1.005–1.030)

## 2018-06-19 LAB — C-REACTIVE PROTEIN: CRP: 0.9 mg/dL (ref <1.0)

## 2018-06-19 LAB — SEDIMENTATION RATE: Sed Rate: 1 mm/hr (ref 0–16)

## 2018-06-19 LAB — I-STAT CG4 LACTIC ACID, ED
Lactic Acid, Venous: 0.44 mmol/L — ABNORMAL LOW (ref 0.5–1.9)
Lactic Acid, Venous: 0.92 mmol/L (ref 0.5–1.9)

## 2018-06-19 MED ORDER — OXYCODONE HCL 5 MG PO TABS
5.0000 mg | ORAL_TABLET | Freq: Once | ORAL | Status: AC
  Administered 2018-06-19: 5 mg via ORAL
  Filled 2018-06-19: qty 1

## 2018-06-19 MED ORDER — SODIUM CHLORIDE 0.9 % IV SOLN
2.0000 g | Freq: Once | INTRAVENOUS | Status: AC
  Administered 2018-06-19: 2 g via INTRAVENOUS
  Filled 2018-06-19: qty 2
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
## 2018-06-24 LAB — CULTURE, BLOOD (ROUTINE X 2)
Culture: NO GROWTH
Culture: NO GROWTH
SPECIAL REQUESTS: ADEQUATE

## 2018-07-01 ENCOUNTER — Encounter (HOSPITAL_COMMUNITY): Payer: Self-pay

## 2018-07-01 ENCOUNTER — Emergency Department (HOSPITAL_COMMUNITY)
Admission: EM | Admit: 2018-07-01 | Discharge: 2018-07-02 | Disposition: A | Discharge status: Other Acute Inpatient Hospital | Payer: Medicaid Other | Attending: Emergency Medicine | Admitting: Emergency Medicine

## 2018-07-01 ENCOUNTER — Other Ambulatory Visit: Payer: Self-pay

## 2018-07-01 DIAGNOSIS — F1120 Opioid dependence, uncomplicated: Secondary | ICD-10-CM | POA: Insufficient documentation

## 2018-07-01 DIAGNOSIS — F3181 Bipolar II disorder: Secondary | ICD-10-CM | POA: Insufficient documentation

## 2018-07-01 DIAGNOSIS — I10 Essential (primary) hypertension: Secondary | ICD-10-CM | POA: Insufficient documentation

## 2018-07-01 DIAGNOSIS — R45851 Suicidal ideations: Secondary | ICD-10-CM | POA: Insufficient documentation

## 2018-07-01 DIAGNOSIS — F1410 Cocaine abuse, uncomplicated: Secondary | ICD-10-CM | POA: Insufficient documentation

## 2018-07-01 DIAGNOSIS — F17210 Nicotine dependence, cigarettes, uncomplicated: Secondary | ICD-10-CM | POA: Insufficient documentation

## 2018-07-01 DIAGNOSIS — Z79899 Other long term (current) drug therapy: Secondary | ICD-10-CM | POA: Insufficient documentation

## 2018-07-01 DIAGNOSIS — Z7982 Long term (current) use of aspirin: Secondary | ICD-10-CM | POA: Insufficient documentation

## 2018-07-01 LAB — COMPREHENSIVE METABOLIC PANEL
ALK PHOS: 39 U/L (ref 38–126)
ALT: 16 U/L (ref 0–44)
ANION GAP: 9 (ref 5–15)
AST: 21 U/L (ref 15–41)
Albumin: 4.2 g/dL (ref 3.5–5.0)
BILIRUBIN TOTAL: 1.7 mg/dL — AB (ref 0.3–1.2)
BUN: 11 mg/dL (ref 6–20)
CALCIUM: 9.1 mg/dL (ref 8.9–10.3)
CO2: 26 mmol/L (ref 22–32)
CREATININE: 1.11 mg/dL (ref 0.61–1.24)
Chloride: 105 mmol/L (ref 98–111)
GFR calc non Af Amer: >60 mL/min (ref >60)
Glucose, Bld: 107 mg/dL — ABNORMAL HIGH (ref 70–99)
Potassium: 3.5 mmol/L (ref 3.5–5.1)
Sodium: 140 mmol/L (ref 135–145)
Total Protein: 7.5 g/dL (ref 6.5–8.1)

## 2018-07-01 LAB — RAPID URINE DRUG SCREEN, HOSP PERFORMED
Amphetamines: NOT DETECTED
Benzodiazepines: NOT DETECTED
COCAINE: POSITIVE — AB
OPIATES: NOT DETECTED
TETRAHYDROCANNABINOL: POSITIVE — AB

## 2018-07-01 LAB — CBC
HEMATOCRIT: 43.3 % (ref 39.0–52.0)
HEMOGLOBIN: 13.9 g/dL (ref 13.0–17.0)
MCH: 28.8 pg (ref 26.0–34.0)
MCHC: 32.1 g/dL (ref 30.0–36.0)
MCV: 89.8 fL (ref 78.0–100.0)
Platelets: 215 10*3/uL (ref 150–400)
RBC: 4.82 MIL/uL (ref 4.22–5.81)
RDW: 14.7 % (ref 11.5–15.5)
WBC: 10.4 10*3/uL (ref 4.0–10.5)

## 2018-07-01 LAB — ETHANOL: Alcohol, Ethyl (B): <10 mg/dL (ref <10)

## 2018-07-01 LAB — SALICYLATE LEVEL

## 2018-07-01 LAB — ACETAMINOPHEN LEVEL
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
## 2018-07-02 ENCOUNTER — Encounter (HOSPITAL_COMMUNITY): Payer: Self-pay

## 2018-07-02 ENCOUNTER — Other Ambulatory Visit: Payer: Self-pay

## 2018-07-02 ENCOUNTER — Inpatient Hospital Stay (HOSPITAL_COMMUNITY)
Admission: AD | Admit: 2018-07-02 | Discharge: 2018-07-04 | DRG: 885 | Disposition: A | Discharge status: Home / Self Care | Payer: Medicaid Other | Source: Intra-hospital | Attending: Psychiatry | Admitting: Psychiatry

## 2018-07-02 DIAGNOSIS — F19151 Other psychoactive substance abuse with psychoactive substance-induced psychotic disorder with hallucinations: Secondary | ICD-10-CM | POA: Diagnosis present

## 2018-07-02 DIAGNOSIS — Z884 Allergy status to anesthetic agent status: Secondary | ICD-10-CM | POA: Diagnosis not present

## 2018-07-02 DIAGNOSIS — F17210 Nicotine dependence, cigarettes, uncomplicated: Secondary | ICD-10-CM | POA: Diagnosis present

## 2018-07-02 DIAGNOSIS — F1424 Cocaine dependence with cocaine-induced mood disorder: Secondary | ICD-10-CM | POA: Insufficient documentation

## 2018-07-02 DIAGNOSIS — F1020 Alcohol dependence, uncomplicated: Secondary | ICD-10-CM | POA: Diagnosis present

## 2018-07-02 DIAGNOSIS — Z8679 Personal history of other diseases of the circulatory system: Secondary | ICD-10-CM | POA: Diagnosis not present

## 2018-07-02 DIAGNOSIS — Z888 Allergy status to other drugs, medicaments and biological substances status: Secondary | ICD-10-CM

## 2018-07-02 DIAGNOSIS — Z653 Problems related to other legal circumstances: Secondary | ICD-10-CM

## 2018-07-02 DIAGNOSIS — R45851 Suicidal ideations: Secondary | ICD-10-CM

## 2018-07-02 DIAGNOSIS — Z8659 Personal history of other mental and behavioral disorders: Secondary | ICD-10-CM

## 2018-07-02 DIAGNOSIS — Z818 Family history of other mental and behavioral disorders: Secondary | ICD-10-CM

## 2018-07-02 DIAGNOSIS — F19951 Other psychoactive substance use, unspecified with psychoactive substance-induced psychotic disorder with hallucinations: Secondary | ICD-10-CM

## 2018-07-02 DIAGNOSIS — F3181 Bipolar II disorder: Principal | ICD-10-CM | POA: Diagnosis present

## 2018-07-02 DIAGNOSIS — M25579 Pain in unspecified ankle and joints of unspecified foot: Secondary | ICD-10-CM | POA: Diagnosis present

## 2018-07-02 DIAGNOSIS — G4700 Insomnia, unspecified: Secondary | ICD-10-CM | POA: Diagnosis present

## 2018-07-02 DIAGNOSIS — Z560 Unemployment, unspecified: Secondary | ICD-10-CM | POA: Diagnosis not present

## 2018-07-02 DIAGNOSIS — F419 Anxiety disorder, unspecified: Secondary | ICD-10-CM | POA: Diagnosis present

## 2018-07-02 DIAGNOSIS — I69954 Hemiplegia and hemiparesis following unspecified cerebrovascular disease affecting left non-dominant side: Secondary | ICD-10-CM | POA: Diagnosis not present

## 2018-07-02 DIAGNOSIS — M25571 Pain in right ankle and joints of right foot: Secondary | ICD-10-CM | POA: Diagnosis present

## 2018-07-02 DIAGNOSIS — Y900 Blood alcohol level of less than 20 mg/100 ml: Secondary | ICD-10-CM | POA: Diagnosis present

## 2018-07-02 DIAGNOSIS — Z23 Encounter for immunization: Secondary | ICD-10-CM

## 2018-07-02 DIAGNOSIS — I10 Essential (primary) hypertension: Secondary | ICD-10-CM | POA: Diagnosis not present

## 2018-07-02 MED ORDER — HALOPERIDOL LACTATE 5 MG/ML IJ SOLN: 2.0000 mg | Freq: Three times a day (TID) | INTRAMUSCULAR | Status: DC | PRN

## 2018-07-02 MED ORDER — LORAZEPAM 1 MG PO TABS
1.0000 mg | ORAL_TABLET | Freq: Four times a day (QID) | ORAL | Status: DC | PRN
  Administered 2018-07-03: 1 mg via ORAL
  Filled 2018-07-02: qty 1

## 2018-07-02 MED ORDER — HYDROXYZINE HCL 50 MG PO TABS
50.0000 mg | ORAL_TABLET | Freq: Every evening | ORAL | Status: DC | PRN
Start: 2018-07-02 — End: 2018-07-04

## 2018-07-02 MED ORDER — MAGNESIUM HYDROXIDE 400 MG/5ML PO SUSP: 30.0000 mL | Freq: Every day | ORAL | Status: DC | PRN

## 2018-07-02 MED ORDER — ACETAMINOPHEN 325 MG PO TABS
650.0000 mg | ORAL_TABLET | Freq: Four times a day (QID) | ORAL | Status: DC | PRN
  Administered 2018-07-02 – 2018-07-03 (×2): 650 mg via ORAL
  Filled 2018-07-02 (×2): qty 2

## 2018-07-02 MED ORDER — PNEUMOCOCCAL VAC POLYVALENT 25 MCG/0.5ML IJ INJ
0.5000 mL | INJECTION | INTRAMUSCULAR | Status: AC
  Administered 2018-07-03: 0.5 mL via INTRAMUSCULAR

## 2018-07-02 MED ORDER — HALOPERIDOL 5 MG PO TABS: 5.0000 mg | ORAL_TABLET | Freq: Three times a day (TID) | ORAL | Status: DC | PRN

## 2018-07-02 MED ORDER — NICOTINE 21 MG/24HR TD PT24
21.0000 mg | MEDICATED_PATCH | Freq: Every day | TRANSDERMAL | Status: DC
  Administered 2018-07-02 – 2018-07-04 (×3): 21 mg via TRANSDERMAL
  Filled 2018-07-02 (×6): qty 1

## 2018-07-02 MED ORDER — LORAZEPAM 2 MG/ML IJ SOLN: 1.0000 mg | Freq: Four times a day (QID) | INTRAMUSCULAR | Status: DC | PRN

## 2018-07-02 MED ORDER — ALUM & MAG HYDROXIDE-SIMETH 200-200-20 MG/5ML PO SUSP: 30.0000 mL | ORAL | Status: DC | PRN

## 2018-07-02 MED ORDER — NALTREXONE HCL 50 MG PO TABS
100.0000 mg | ORAL_TABLET | Freq: Every day | ORAL | Status: DC
  Administered 2018-07-02 – 2018-07-04 (×3): 100 mg via ORAL
  Filled 2018-07-02 (×6): qty 2

## 2018-07-02 MED ORDER — HYDROXYZINE HCL 50 MG PO TABS: 50.0000 mg | ORAL_TABLET | Freq: Three times a day (TID) | ORAL | Status: DC | PRN

## 2018-07-02 NOTE — ED Provider Notes (Signed)
[facility] EMERGENCY DEPARTMENT Provider Note   CSN: [id number] Arrival date & time: 07/01/18  2213     History   Chief Complaint Chief Complaint  Patient presents with  . Suicidal    HPI Dustin Marquez is a 37 y.o. male.  HPI Patient presents to the emergency department with suicidal ideations that have been ongoing over the last week or so.  Patient states he did attempt to jump in front of a train 2 days ago.  Patient states he has been hearing voices but denies any visual hallucinations.  Patient denies being homicidal but does feel suicidal.  Patient states that he has not had any other attempts at suicide recently.  The patient denies chest pain, shortness of breath, headache,blurred vision, neck pain, fever, cough, weakness, numbness, dizziness, anorexia, edema, abdominal pain, nausea, vomiting, diarrhea, rash, back pain, dysuria, hematemesis, bloody stool, near syncope, or syncope. Past Medical History:  Diagnosis Date  . Bipolar 2 disorder ([place])   . Depression   . Hypertension   . Jaw dislocation   . Opiate dependence ([place]) 11/28/2017    Patient Active Problem List   Diagnosis Date Noted  . Opiate dependence ([place]) 11/28/2017  . Encounter for care related to feeding tube   . H/O cervical fracture   . History of pneumothorax   . Tibia/fibula fracture   . Hardware complicating wound infection ([place])   . 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 Cytotoxic brain edema ([place]) 09/11/2017  . Left hemiplegia ([place]) 09/11/2017  . Left homonymous hemianopsia 09/11/2017  . Closed right pilon fracture, initial encounter 09/09/2017  . C6 cervical fracture ([place]) 09/06/2017  . Bipolar affective disorder, depressed, severe ([place]) 05/05/2013    Class: Acute  . Cocaine abuse ([place]) 05/05/2013    Past Surgical History:  Procedure Laterality Date  . EXTERNAL  FIXATION LEG Right 09/09/2017   Procedure: EXTERNAL FIXATION RIGHT LOWER LEG;  Surgeon: Hiram Gash, MD;  Location: [facility];  Service: Orthopedics;  Laterality: Right;  . FRACTURE SURGERY    . I&D EXTREMITY Right 10/02/2017   Procedure: IRRIGATION AND DEBRIDEMENT EXTREMITY;  Surgeon: Hiram Gash, MD;  Location: [facility];  Service: Orthopedics;  Laterality: Right;  . IR ANGIO EXTERNAL CAROTID SEL EXT CAROTID UNI L MOD SED  09/06/2017  . IR ANGIO INTRA EXTRACRAN SEL COM CAROTID INNOMINATE BILAT MOD SED  09/06/2017  . IR ANGIO VERTEBRAL SEL SUBCLAVIAN INNOMINATE BILAT MOD SED  09/06/2017  . ORIF TIBIA FRACTURE Right 09/19/2017   Procedure: OPEN REDUCTION INTERNAL FIXATION (ORIF) TIBIA FRACTURE;  Surgeon: Hiram Gash, MD;  Location: [facility];  Service: Orthopedics;  Laterality: Right;        Home Medications    Prior to Admission medications   Medication Sig Start Date End Date Taking? Authorizing Provider  acetaminophen (TYLENOL) 500 MG tablet Take 1,000 mg by mouth every 8 (eight) hours.    [provider]  aspirin 81 MG chewable tablet Chew 1 tablet (81 mg total) daily by mouth. 11/12/17   Simaan, Darci Current, PA-C  ceFEPime (MAXIPIME) IVPB Inject 2 g into the vein every 8 (eight) hours. Indication: Hardware associated osteomyelitis Last Day of Therapy:  11/28/2017 Labs - Once weekly:  CBC/D and BMP, Labs - Every other week:  ESR and CRP Patient not taking: Reported on 01/24/2018 10/08/17   Jill Alexanders, PA-C  clopidogrel (PLAVIX) 75  MG tablet Take 1 tablet (75 mg total) daily by mouth. Patient not taking: Reported on 01/24/2018 11/12/17   Jill Alexanders, PA-C  divalproex (DEPAKOTE) 500 MG DR tablet Take 2 tablets (1,000 mg total) every 12 (twelve) hours by mouth. Patient not taking: Reported on 01/24/2018 11/12/17   Jill Alexanders, PA-C  feeding supplement, ENSURE ENLIVE, (ENSURE ENLIVE) LIQD Take 237 mLs 3 (three) times daily between meals by mouth. Patient not taking:  Reported on 01/24/2018 11/12/17   Jill Alexanders, PA-C  methocarbamol (ROBAXIN) 500 MG tablet Take 1 tablet (500 mg total) every 8 (eight) hours as needed by mouth for muscle spasms. Patient not taking: Reported on 01/24/2018 11/12/17   Jill Alexanders, PA-C  naproxen (NAPROSYN) 500 MG tablet Take 1 tablet (500 mg total) by mouth 2 (two) times daily. 05/23/18   Law, Bea Graff, PA-C  oxyCODONE (OXY IR/ROXICODONE) 5 MG immediate release tablet Take 1 tablet (5 mg total) by mouth every 12 (twelve) hours. Patient not taking: Reported on 01/24/2018 12/06/17   Wille Celeste, PA-C  pantoprazole (PROTONIX) 40 MG tablet Take 1 tablet (40 mg total) daily by mouth. Patient not taking: Reported on 01/24/2018 11/12/17   Jill Alexanders, PA-C  polyethylene glycol [facility] / GLYCOLAX) packet Take 17 g daily as needed by mouth. Patient not taking: Reported on 01/24/2018 11/12/17   Jill Alexanders, PA-C    Family History No family history on file.  Social History Social History   Tobacco Use  . Smoking status: Current Every Day Smoker    Packs/day: 0.50    Types: Cigarettes  . Smokeless tobacco: Never Used  Substance Use Topics  . Alcohol use: Not Currently  . Drug use: Not Currently    Comment: denies     Allergies   Chlorhexidine and Lamictal [lamotrigine]   Review of Systems Review of Systems  All other systems negative except as documented in the HPI. All pertinent positives and negatives as reviewed in the HPI. Physical Exam Updated Vital Signs BP 122/88   Pulse 79   Temp 98.5 F (36.9 C) (Oral)   Resp 18   SpO2 100%   Physical Exam  Constitutional: He is oriented to person, place, and time. He appears well-developed and well-nourished. No distress.  HENT:  Head: Normocephalic and atraumatic.  Mouth/Throat: Oropharynx is clear and moist.  Eyes: Pupils are equal, round, and reactive to light.  Neck: Normal range of motion. Neck supple.  Cardiovascular: Normal rate,  regular rhythm and normal heart sounds. Exam reveals no gallop and no friction rub.  No murmur heard. Pulmonary/Chest: Effort normal and breath sounds normal. No respiratory distress. He has no wheezes.  Abdominal: Soft. Bowel sounds are normal. He exhibits no distension. There is no tenderness.  Neurological: He is alert and oriented to person, place, and time. He exhibits normal muscle tone. Coordination normal.  Skin: Skin is warm and dry. Capillary refill takes less than 2 seconds. No rash noted. No erythema.  Psychiatric: He has a normal mood and affect. His behavior is normal. He expresses suicidal ideation. He expresses suicidal plans.  Nursing note and vitals reviewed.    ED Treatments / Results  Labs (all labs ordered are listed, but only abnormal results are displayed) Labs Reviewed  COMPREHENSIVE METABOLIC PANEL - Abnormal; Notable for the following components:      Result Value   Glucose, Bld 107 (*)    Total Bilirubin 1.7 (*)    All other components  within normal limits  ACETAMINOPHEN LEVEL - Abnormal; Notable for the following components:   Acetaminophen (Tylenol), Serum <10 (*)    All other components within normal limits  RAPID URINE DRUG SCREEN, HOSP PERFORMED - Abnormal; Notable for the following components:   Cocaine POSITIVE (*)    Tetrahydrocannabinol POSITIVE (*)    Barbiturates   (*)    Value: Result not available. Reagent lot number recalled by manufacturer.   All other components within normal limits  ETHANOL  SALICYLATE LEVEL  CBC    EKG None  Radiology No results found.  Procedures Procedures (including critical care time)  Medications Ordered in ED Medications - No data to display   Initial Impression / Assessment and Plan / ED Course  I have reviewed the triage vital signs and the nursing notes.  Pertinent labs & imaging results that were available during my care of the patient were reviewed by me and considered in my medical decision  making (see chart for details).     The patient will need a TTS assessment to further evaluate suicidal ideation and attempt. Final Clinical Impressions(s) / ED Diagnoses   Final diagnoses:  None    ED Discharge Orders    None       Dalia Heading, PA-C 07/02/18 2952    Little, Wenda Overland, MD 07/02/18 1248

## 2018-07-02 NOTE — ED Notes (Signed)
Regular Diet was ordered for Lunch. 

## 2018-07-02 NOTE — Tx Team (Signed)
Initial Treatment Plan 07/02/2018 12:50 PM Dustin LoanSpencer M Signore [id number]    PATIENT STRESSORS: Financial difficulties Health problems Legal issue Loss of mother/father Substance abuse   PATIENT STRENGTHS: Ability for insight Active sense of humor Capable of independent living Communication skills Motivation for treatment/growth Supportive family/friends   PATIENT IDENTIFIED PROBLEMS: "stop having suicidal thoughts"  "get into a more positive environment"  "move forward with life"                 DISCHARGE CRITERIA:  Ability to meet basic life and health needs Adequate post-discharge living arrangements Improved stabilization in mood, thinking, and/or behavior Motivation to continue treatment in a less acute level of care  PRELIMINARY DISCHARGE PLAN: Attend aftercare/continuing care group Return to previous living arrangement  PATIENT/FAMILY INVOLVEMENT: This treatment plan has been presented to and reviewed with the patient, Dustin Marquez.  The patient and family have been given the opportunity to ask questions and make suggestions.  Raylene MiyamotoMichael R Marjie Chea, RN 07/02/2018, 12:50 PM

## 2018-07-02 NOTE — ED Notes (Signed)
TTS in process-Monique,RN  

## 2018-07-02 NOTE — Progress Notes (Signed)
Adult Psychoeducational Group Note  Date:  07/02/2018 Time:  9:41 PM  Group Topic/Focus:  Wrap-Up Group:   The focus of this group is to help patients review their daily goal of treatment and discuss progress on daily workbooks.  Participation Level:  Active  Participation Quality:  Appropriate  Affect:  Flat  Cognitive:  Appropriate  Insight: Appropriate  Engagement in Group:  Engaged  Modes of Intervention:  Socialization and Support  Additional Comments:  Patient attended and participated in group tonight. He reports having a good day. He rested, went for meals ane attended group. He also spoke with his doctor.  [name], [name] [facility] 07/02/2018, 9:41 PM

## 2018-07-02 NOTE — ED Notes (Signed)
PT sitting up in bed eating breakfast. No requests at this time

## 2018-07-02 NOTE — ED Notes (Signed)
[facility]
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
## 2018-07-02 NOTE — ED Notes (Signed)
The Patient was given a Snack and Drink. 
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
## 2018-07-02 NOTE — Plan of Care (Signed)
Pt progressing in the following metrics  Problem: Coping: Goal: Ability to verbalize frustrations and anger appropriately will improve Outcome: Progressing   Problem: Health Behavior/Discharge Planning: Goal: Compliance with treatment plan for underlying cause of condition will improve Outcome: Progressing   Problem: Physical Regulation: Goal: Ability to maintain clinical measurements within normal limits will improve Outcome: Progressing   Problem: Safety: Goal: Periods of time without injury will increase Outcome: Progressing   Problem: Health Behavior/Discharge Planning: Goal: Ability to remain free from injury will improve Outcome: Progressing   Problem: Self-Concept: Goal: Will verbalize positive feelings about self Outcome: Progressing   Problem: Activity: Goal: Will identify at least one activity in which they can participate Outcome: Progressing   Problem: Coping: Goal: Ability to interact with others will improve Outcome: Progressing   Problem: Coping: Goal: Will verbalize feelings Outcome: Progressing

## 2018-07-02 NOTE — Progress Notes (Signed)
[facility] Dustin Rankin, NP is the accepting provider.  Dr. Viviano SimasMaurer is the attending provider.  Call report to [phone]  [facility]in [email or web address]@ [facility] notified.   Pt is Voluntary.  Pt may be transported by [facility]  Pt [facility]  [facility]  Dustin BernJean T. Kaylyn LimSutter, MSW, LCSWA Disposition Clinical Social Work [phone] (cell) [phone] (office)

## 2018-07-02 NOTE — ED Notes (Signed)
[facility] aware of transport request and time.

## 2018-07-02 NOTE — ED Notes (Signed)
503 bed 2 ; accepting Dustin Marquez ; Attending Dr. Viviano SimasMaurer; [facility]  Fax [phone]  Report [phone]

## 2018-07-02 NOTE — ED Provider Notes (Signed)
  Physical Exam  BP (!) 100/56 (BP Location: Right Arm)   Pulse 62   Temp 98.5 F (36.9 C) (Oral)   Resp 16   SpO2 100%   Physical Exam  ED Course/Procedures     Procedures  MDM  Patient is medically cleared for placement.       Benjiman CorePickering, Divine Imber, MD 07/02/18 [phone]
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
## 2018-07-03 MED ORDER — NAPROXEN 500 MG PO TABS
500.0000 mg | ORAL_TABLET | Freq: Two times a day (BID) | ORAL | Status: DC
  Administered 2018-07-03 – 2018-07-04 (×3): 500 mg via ORAL
  Filled 2018-07-03 (×7): qty 1

## 2018-07-03 MED ORDER — CLOPIDOGREL BISULFATE 75 MG PO TABS
75.0000 mg | ORAL_TABLET | Freq: Every day | ORAL | Status: DC
  Administered 2018-07-03 – 2018-07-04 (×2): 75 mg via ORAL
  Filled 2018-07-03 (×5): qty 1

## 2018-07-03 NOTE — [facility] Suicide Risk Assessment (Signed)
[facility] INPATIENT:  Family/Significant Other Suicide Prevention Education  Suicide Prevention Education:  Patient Refusal for Family/Significant Other Suicide Prevention Education: The patient Dustin Marquez has refused to provide written consent for family/significant other to be provided Family/Significant Other Suicide Prevention Education during admission and/or prior to discharge.  Physician notified.  [name] 07/03/2018, 3:31 PM

## 2018-07-03 NOTE — Progress Notes (Signed)
Recreation Therapy Notes  Date: 7.11.19 Time: 1000 Location: 500 Hall Dayroom  Group Topic: Communication, Team Building, Problem Solving  Goal Area(s) Addresses:  Patient will effectively work with peer towards shared goal.  Patient will identify skill used to make activity successful.  Patient will identify how skills used during activity can be used to reach post d/c goals.   Intervention: STEM Activity   Activity: In team's, using 10 red plastic cups, patients were asked to build pyramid.  One of the cups will have a rubber band with 4 strings attached to it.  This cup will be used as the main tool to stack the rest of the cups.    Education: Social Skills, Discharge Planning.   Education Outcome: Acknowledges education/In group clarification offered/Needs additional education.   Clinical Observations/Feedback: Pt did not attend group.    Tahiri Shareef, LRT/CTRS         Hailey Stormer A 07/03/2018 11:54 AM 

## 2018-07-03 NOTE — [facility] Group Notes (Signed)
LCSW Group Therapy Note  07/03/2018 1:15pm  Type of Therapy/Topic:  Group Therapy:  Balance in Life  Participation Level:  Did Not Attend  Description of Group:    This group will address the concept of balance and how it feels and looks when one is unbalanced. Patients will be encouraged to process areas in their lives that are out of balance and identify reasons for remaining unbalanced. Facilitators will guide patients in utilizing problem-solving interventions to address and correct the stressor making their life unbalanced. Understanding and applying boundaries will be explored and addressed for obtaining and maintaining a balanced life. Patients will be encouraged to explore ways to assertively make their unbalanced needs known to significant others in their lives, using other group members and facilitator for support and feedback.  Therapeutic Goals: 1. Patient will identify two or more emotions or situations they have that consume much of in their lives. 2. Patient will identify signs/triggers that life has become out of balance:  3. Patient will identify two ways to set boundaries in order to achieve balance in their lives:  4. Patient will demonstrate ability to communicate their needs through discussion and/or role plays  Summary of Patient Progress:      Therapeutic Modalities:   Cognitive Behavioral Therapy Solution-Focused Therapy Assertiveness Training  Demetric Dunnaway B Martie Fulgham, LCSW 07/03/2018 3:14 PM  

## 2018-07-03 NOTE — Plan of Care (Signed)
Pt progressing in the following metrics  D: pt found in bed this morning. Pt compliant with medication administration.  Pt states he slept fair last night. Pt rates his ankle pain at an 8/10. Pt rates his depression/hopelessness/anxiety a 07/30/09 out of 10 respectively. Pt states his goal for today is to continuing to manage his physical pain and anxiety. The patient didn't state how he would achieve this though. Pt denies any si/hi/ah/vh and verbally agrees to approach staff if these become apparent.  A: pt provided support and encouragement. Pt provided medications including the pneumonia vaccine per protocol and standing orders. Q6031m safety checks implemented and continued.  R: pt safe on the unit. Will continue to monitor.   Problem: Education: Goal: Emotional status will improve Outcome: Progressing Goal: Mental status will improve Outcome: Progressing   Problem: Activity: Goal: Interest or engagement in activities will improve Outcome: Progressing   Problem: Coping: Goal: Ability to verbalize frustrations and anger appropriately will improve Outcome: Progressing   Problem: Physical Regulation: Goal: Ability to maintain clinical measurements within normal limits will improve Outcome: Progressing   Problem: Safety: Goal: Periods of time without injury will increase Outcome: Progressing   Problem: Health Behavior/Discharge Planning: Goal: Ability to remain free from injury will improve Outcome: Progressing   Problem: Self-Concept: Goal: Will verbalize positive feelings about self Outcome: Progressing   Problem: Coping: Goal: Level of anxiety will decrease Outcome: Progressing
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
## 2018-07-04 ENCOUNTER — Encounter (HOSPITAL_COMMUNITY): Payer: Self-pay | Admitting: *Deleted

## 2018-07-04 ENCOUNTER — Other Ambulatory Visit: Payer: Self-pay

## 2018-07-04 ENCOUNTER — Emergency Department (HOSPITAL_COMMUNITY)
Admission: EM | Admit: 2018-07-04 | Discharge: 2018-07-04 | Disposition: A | Discharge status: Home / Self Care | Payer: Medicaid Other | Attending: Emergency Medicine | Admitting: Emergency Medicine

## 2018-07-04 DIAGNOSIS — M25571 Pain in right ankle and joints of right foot: Secondary | ICD-10-CM | POA: Diagnosis not present

## 2018-07-04 DIAGNOSIS — F17210 Nicotine dependence, cigarettes, uncomplicated: Secondary | ICD-10-CM | POA: Insufficient documentation

## 2018-07-04 DIAGNOSIS — I10 Essential (primary) hypertension: Secondary | ICD-10-CM | POA: Insufficient documentation

## 2018-07-04 DIAGNOSIS — G8929 Other chronic pain: Secondary | ICD-10-CM

## 2018-07-04 MED ORDER — NALTREXONE HCL 50 MG PO TABS: 100.0000 mg | ORAL_TABLET | Freq: Every day | ORAL | 0 refills | Status: DC

## 2018-07-04 MED ORDER — HYDROXYZINE HCL 50 MG PO TABS: 50.0000 mg | ORAL_TABLET | Freq: Four times a day (QID) | ORAL | 0 refills | Status: DC | PRN

## 2018-07-04 MED ORDER — NICOTINE 21 MG/24HR TD PT24: 21.0000 mg | MEDICATED_PATCH | Freq: Every day | TRANSDERMAL | 0 refills | Status: AC

## 2018-07-04 MED ORDER — NAPROXEN 500 MG PO TABS: 500.0000 mg | ORAL_TABLET | Freq: Two times a day (BID) | ORAL | Status: DC

## 2018-07-04 MED ORDER — KETOROLAC TROMETHAMINE 10 MG PO TABS: 10.0000 mg | ORAL_TABLET | Freq: Four times a day (QID) | ORAL | 0 refills | Status: DC | PRN

## 2018-07-04 MED ORDER — CLOPIDOGREL BISULFATE 75 MG PO TABS: 75.0000 mg | ORAL_TABLET | Freq: Every day | ORAL | Status: DC

## 2018-07-04 NOTE — Plan of Care (Signed)
Discharge note  Patient verbalizes readiness for discharge. Follow up plan explained, AVS, Transition record and SRA given. Prescriptions and teaching provided. Belongings returned and signed for. Suicide safety plan completed and signed. Patient verbalizes understanding. Patient denies SI/HI and assures this Clinical research associatewriter he will seek assistance should that change. Patient discharged to lobby with cab and bus voucher.  Problem: Education: Goal: Knowledge of [place] [facility] Education information/materials will improve Outcome: Adequate for Discharge Goal: Emotional status will improve Outcome: Adequate for Discharge Goal: Mental status will improve Outcome: Adequate for Discharge Goal: Verbalization of understanding the information provided will improve Outcome: Adequate for Discharge   Problem: Activity: Goal: Interest or engagement in activities will improve Outcome: Adequate for Discharge Goal: Sleeping patterns will improve Outcome: Adequate for Discharge   Problem: Coping: Goal: Ability to verbalize frustrations and anger appropriately will improve Outcome: Adequate for Discharge Goal: Ability to demonstrate self-control will improve Outcome: Adequate for Discharge   Problem: Health Behavior/Discharge Planning: Goal: Identification of resources available to assist in meeting health care needs will improve Outcome: Adequate for Discharge Goal: Compliance with treatment plan for underlying cause of condition will improve Outcome: Adequate for Discharge   Problem: Physical Regulation: Goal: Ability to maintain clinical measurements within normal limits will improve Outcome: Adequate for Discharge   Problem: Safety: Goal: Periods of time without injury will increase Outcome: Adequate for Discharge   Problem: Education: Goal: Ability to incorporate positive changes in behavior to improve self-esteem will improve Outcome: Adequate for Discharge   Problem: Health  Behavior/Discharge Planning: Goal: Ability to identify and utilize available resources and services will improve Outcome: Adequate for Discharge Goal: Ability to remain free from injury will improve Outcome: Adequate for Discharge   Problem: Self-Concept: Goal: Will verbalize positive feelings about self Outcome: Adequate for Discharge   Problem: Skin Integrity: Goal: Demonstration of wound healing without infection will improve Outcome: Adequate for Discharge   Problem: Activity: Goal: Will identify at least one activity in which they can participate Outcome: Adequate for Discharge   Problem: Coping: Goal: Ability to identify and develop effective coping behavior will improve Outcome: Adequate for Discharge Goal: Ability to interact with others will improve Outcome: Adequate for Discharge Goal: Demonstration of participation in decision-making regarding own care will improve Outcome: Adequate for Discharge Goal: Ability to use eye contact when communicating with others will improve Outcome: Adequate for Discharge   Problem: Health Behavior/Discharge Planning: Goal: Identification of resources available to assist in meeting health care needs will improve Outcome: Adequate for Discharge   Problem: Self-Concept: Goal: Will verbalize positive feelings about self Outcome: Adequate for Discharge   Problem: Education: Goal: Verbalization of understanding the information provided will improve Outcome: Adequate for Discharge   Problem: Coping: Goal: Level of anxiety will decrease Outcome: Adequate for Discharge Goal: Ability to identify and utilize appropriate coping strategies will improve Outcome: Adequate for Discharge Goal: Ability to identify and utilize available resources and services will improve Outcome: Adequate for Discharge Goal: Will verbalize feelings Outcome: Adequate for Discharge

## 2018-07-04 NOTE — Progress Notes (Signed)
  [facility] :  Will you be returning to the same living situation after discharge:  Yes,  home-but would not give deatials as to exactly where that is At discharge, do you have transportation home?: Yes,  cab to Dr appointment, bus pass Do you have the ability to pay for your medications: Yes,  MCD  Release of information consent forms completed and in the chart;  Patient's signature needed at discharge.  Patient to Follow up at: Follow-up Information    [facility] Follow up on 07/07/2018.   Why:  Tahleal's number is [phone].  Call him for help with transportation.  Your hospital follow up appointment is Monday at 8AM with Nicole Cellaorothy.  Bring your MCD card and hospital d/c paperwork Contact information: [address] [place] [address] [phone]        [facility], [facility] Follow up on 07/14/2018.   Why:  Monday at 8AM for your screening for admission appointment.  Bring your ID, MCD card and 28 day supply of medication.  If you don't bring these things, you will not be admitted. Contact information: [place] [address] [place] [address] [phone]           Next level of care provider has [facility] to [facility] Link:no  Safety Planning and Suicide Prevention discussed: Yes,  yes     Has patient been referred to the Quitline?: Patient refused referral  Patient has been referred for addiction treatment: Yes  Ida RogueRodney B Rasheida Broden, LCSW 07/04/2018, 1:19 PM

## 2018-07-04 NOTE — ED Triage Notes (Signed)
Pt in stating he has had right leg and ankle pain for months and pain is not getting any better because he has to walk everywhere, [facility] and [facility] were giving him naproxen which did not help, ambulatory to room

## 2018-07-04 NOTE — Progress Notes (Signed)
Recreation Therapy Notes  Date: 7.12.19 Time: 1000 Location: 500 Hall Dayroom  Group Topic: Leisure Education, Goal Setting  Goal Area(s) Addresses:  Patient will be able to identify at least 3 goals for leisure participation.  Patient will be able to identify benefit of investing in leisure participation.  Patient will be able to identify benefit of setting leisure goals.   Intervention: Worksheet  Activity: Goal Planning.  Patients were to set goals for the next week, month, year and next five years.  Patients were to then identify obstacles, what they need to achieve their goals and what they can start doing tomorrow to work towards their goals.  Education:  Discharge Planning, Coping Skills, Leisure Education   Education Outcome: Acknowledges Education/In Group Clarification Provided/Needs Additional Education  Clinical Observations: Pt did not attend group.    Dustin Marquez, LRT/CTRS         Dustin Marquez A 07/04/2018 12:23 PM 
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
## 2018-07-04 NOTE — Progress Notes (Signed)
Nursing Progress Note: 7p-7a D: Pt currently presents with a anxious/sad/flat affect and behavior. Pt states "I would really like to have my cane." Interacting minimally with the milieu. Pt reports good sleep during the previous night with current medication regimen.   A: Pt provided with medications per providers orders. Pt's labs and vitals were monitored throughout the night. Pt supported emotionally and encouraged to express concerns and questions. Pt educated on medications.  R: Pt's safety ensured with 15 minute and environmental checks. Pt currently denies SI, HI, and AVH. Pt verbally contracts to seek staff if SI,HI, or AVH occurs and to consult with staff before acting on any harmful thoughts. Will continue to monitor.

## 2018-07-04 NOTE — ED Notes (Addendum)
Patient received crutches education and then left without discharge paperwork or follow up vitals.
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
## 2018-07-04 NOTE — ED Notes (Addendum)
Paged ortho tech, aware of need for crutches education
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
## 2018-07-04 NOTE — Discharge Instructions (Addendum)
You were seen in the ER for chronic right ankle pain.  Exam was reassuring.  There are no signs of infection of the skin.  X-rays done recently looked good.  Wear crutches to help with walking.  Take 500 to 1000 mg of acetaminophen every 6-8 hours.  We have switched your naproxen for Toradol to see if this will help your pain.  You need to contact orthopedist for follow-up as soon as you are able to.  See above for orthopedists in the area.   Return for joint redness, warmth, swelling, fevers, chills, calf pain or swelling

## 2018-07-04 NOTE — Tx Team (Signed)
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 acetaminophen (TYLENOL) tablet 650 mg  650 mg Oral Q6H PRN Lavella Hammock, MD   650 mg at 07/03/18 0937  . alum & mag hydroxide-simeth (MAALOX/MYLANTA) 200-200-20 MG/5ML suspension 30 mL  30 mL Oral Q4H PRN Lavella Hammock, MD      . clopidogrel (PLAVIX) tablet 75 mg  75 mg Oral Daily Lavella Hammock, MD   75 mg at 07/03/18 1204  . haloperidol (HALDOL) tablet 5 mg  5 mg Oral Q8H PRN Lavella Hammock, MD       Or  . haloperidol lactate (HALDOL) injection 2 mg  2 mg Intramuscular Q8H PRN Lavella Hammock, MD      . hydrOXYzine (ATARAX/VISTARIL) tablet 50 mg  50 mg Oral TID PRN Lavella Hammock, MD      . hydrOXYzine (ATARAX/VISTARIL) tablet 50 mg  50 mg Oral QHS PRN,MR X 1 Lavella Hammock, MD      . LORazepam (ATIVAN) tablet 1 mg  1 mg Oral Q6H PRN Lavella Hammock, MD   1 mg at 07/03/18 2234   Or  . LORazepam (ATIVAN) injection 1 mg  1 mg Intramuscular Q6H PRN Lavella Hammock, MD      . magnesium hydroxide (MILK OF MAGNESIA) suspension 30 mL  30 mL Oral Daily PRN Lavella Hammock, MD      . naltrexone (DEPADE) tablet 100 mg  100 mg Oral Daily Lavella Hammock, MD   100 mg at 07/03/18 1761  . naproxen (NAPROSYN) tablet 500 mg  500 mg Oral BID WC Lavella Hammock, MD   500 mg at 07/04/18 6073  . nicotine (NICODERM CQ - dosed in mg/24 hours) patch 21 mg  21 mg Transdermal Daily Lavella Hammock, MD   21 mg at 07/04/18 7106    PTA Medications: Medications Prior to Admission  Medication Sig Dispense Refill Last Dose  . acetaminophen (TYLENOL) 500 MG tablet Take 1,000 mg by mouth every 8 (eight) hours.   Past Week at Unknown time  . aspirin 81 MG chewable tablet Chew 1 tablet (81 mg total) daily by mouth. (Patient not taking: Reported on 07/02/2018)   Not Taking at Unknown time  . ceFEPime (MAXIPIME) IVPB Inject 2 g into the vein every 8 (eight) hours. Indication: Hardware associated osteomyelitis Last Day of Therapy:  11/28/2017 Labs - Once weekly:  CBC/D and BMP, Labs - Every other week:  ESR and CRP (Patient not taking: Reported on 01/24/2018) 60 Units 0 Not Taking at Unknown time  . clopidogrel (PLAVIX) 75 MG tablet Take 1 tablet (75 mg total) daily by mouth. (Patient not taking: Reported on 01/24/2018)   Not Taking at Unknown time  . divalproex (DEPAKOTE) 500 MG DR tablet Take 2 tablets (1,000 mg total) every 12 (twelve) hours by mouth. (Patient not taking: Reported on 01/24/2018)   Not Taking at Unknown time  . feeding supplement, ENSURE ENLIVE, (ENSURE ENLIVE) LIQD Take 237 mLs 3 (three) times daily between  meals by mouth. (Patient not taking: Reported on 01/24/2018) 237 mL 12 Not Taking at Unknown time  . methocarbamol (ROBAXIN) 500 MG tablet Take 1 tablet (500 mg total) every 8 (eight) hours as needed by mouth for muscle spasms. (Patient not taking: Reported on 01/24/2018) 15 tablet 0 Not Taking at Unknown time  . naproxen (NAPROSYN) 500 MG tablet Take 1 tablet (500 mg total) by mouth 2 (two) times daily. (Patient not taking: Reported on 07/02/2018) 30 tablet 0 Not Taking at Unknown time  . oxyCODONE (OXY IR/ROXICODONE) 5 MG immediate release tablet Take 1 tablet (5 mg total) by mouth every 12 (twelve) hours. (Patient not taking: Reported on 01/24/2018) 30 tablet 0 Not Taking at Unknown time  . pantoprazole (PROTONIX) 40 MG tablet Take 1 tablet (40 mg total) daily by mouth. (Patient not  taking: Reported on 01/24/2018)   Not Taking at Unknown time  . polyethylene glycol (MIRALAX / GLYCOLAX) packet Take 17 g daily as needed by mouth. (Patient not taking: Reported on 01/24/2018) 14 each 0 Not Taking at Unknown time    Patient Stressors: Financial difficulties Health problems Legal issue Loss of mother/father Substance abuse  Patient Strengths: Ability for insight Active sense of humor Capable of independent living Communication skills Motivation for treatment/growth Supportive family/friends  Treatment Modalities: Medication Management, Group therapy, Case management,  1 to 1 session with clinician, Psychoeducation, Recreational therapy.   Physician Treatment Plan for Primary Diagnosis: Substance-induced psychotic disorder with hallucinations ([place]) Long Term Goal(s): Improvement in symptoms so as ready for discharge  Short Term Goals: Ability to identify changes in lifestyle to reduce recurrence of condition will improve Ability to verbalize feelings will improve Ability to disclose and discuss suicidal ideas Ability to identify and develop effective coping behaviors will improve Ability to identify triggers associated with substance abuse/mental health issues will improve Ability to identify changes in lifestyle to reduce recurrence of condition will improve Ability to verbalize feelings will improve Ability to disclose and discuss suicidal ideas Ability to demonstrate self-control will improve Ability to identify and develop effective coping behaviors will improve Ability to identify triggers associated with substance abuse/mental health issues will improve  Medication Management: Evaluate patient's response, side effects, and tolerance of medication regimen.  Therapeutic Interventions: 1 to 1 sessions, Unit Group sessions and Medication administration.  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[place])   Bipolar 2 disorder ([place])   Suicidal ideation   Long Term Goal(s): Improvement in symptoms so as ready for discharge  Short Term Goals: Ability to identify changes in lifestyle to reduce recurrence of condition will improve Ability to verbalize feelings will improve Ability to disclose and discuss suicidal ideas Ability to identify and develop effective coping behaviors will improve Ability to identify triggers associated with substance abuse/mental health issues will improve Ability to identify changes in lifestyle to reduce recurrence of condition will improve Ability to verbalize feelings will improve Ability to disclose and discuss suicidal ideas Ability to demonstrate self-control will improve Ability to identify and develop effective coping behaviors will improve Ability to identify triggers associated with substance abuse/mental health issues will improve  Medication Management: Evaluate patient's response, side effects, and tolerance of medication regimen.  Therapeutic Interventions: 1 to 1 sessions, Unit Group sessions and Medication administration.  Evaluation of Outcomes: Adequate for Discharge  RN Treatment Plan for Primary Diagnosis: Substance-induced psychotic disorder with hallucinations ([place]) Long Term Goal(s): Knowledge of disease and therapeutic regimen to maintain health will improve  Short Term Goals: Ability to identify and develop effective coping behaviors will improve and Compliance with prescribed medications will improve  Medication Management: RN will administer medications as ordered by provider, will assess and evaluate patient's response and provide education to patient for prescribed medication. RN will report any adverse and/or side effects to prescribing  provider.  Therapeutic Interventions: 1 on 1 counseling sessions, Psychoeducation, Medication administration, Evaluate responses to treatment, Monitor vital signs and CBGs as ordered, Perform/monitor CIWA, COWS, AIMS and Fall Risk screenings as ordered, Perform wound care treatments as ordered.  Evaluation of Outcomes: Adequate for Discharge   LCSW Treatment Plan for Primary Diagnosis: Substance-induced psychotic disorder with hallucinations ([place]) Long Term Goal(s): Safe transition to appropriate next level of care at discharge, Engage patient in therapeutic group addressing interpersonal concerns.  Short Term Goals: Engage patient in aftercare planning with referrals and resources  Therapeutic Interventions: Assess for all discharge needs, 1 to 1 time with Social worker, Explore available resources and support systems, Assess for adequacy in community support network, Educate family and significant other(s) on suicide prevention, Complete Psychosocial Assessment, Interpersonal group therapy.  Evaluation of Outcomes: Met  Return home, follow up [facility]   Progress in Treatment: Attending groups: Yes Participating in groups: Yes Taking medication as prescribed: Yes Toleration medication: Yes, no side effects reported at this time Family/Significant other contact made: No Patient understands diagnosis: Yes AEB asking for help with addictions Discussing patient identified problems/goals with staff: Yes Medical problems stabilized or resolved: Yes Denies suicidal/homicidal ideation: Yes Issues/concerns per patient self-inventory: None Other: N/A  New problem(s) identified: None identified at this time.   New Short Term/Long Term Goal(s): "Get help with getting sober.  I needed a timeout."   Discharge Plan or Barriers:   Reason for Continuation of Hospitalization:   Medication stabilization   Estimated Length of Stay: D/C today  Attendees: Patient: Dustin Marquez 07/04/2018  8:43 AM  Physician: Melba Coon, MD 07/04/2018  8:43 AM  Nursing: Sena Hitch, RN 07/04/2018  8:43 AM  RN Care Manager: Lars Pinks, RN 07/04/2018  8:43 AM  Social Worker: Ripley Fraise 07/04/2018  8:43 AM  Recreational Therapist: Winfield Cunas 07/04/2018  8:43 AM  Other: Norberto Sorenson 07/04/2018  8:43 AM  Other:  07/04/2018  8:43 AM    Scribe for Treatment Team:  Roque Lias LCSW 07/04/2018 8:43 AM

## 2018-07-04 NOTE — Progress Notes (Signed)
Orthopedic Tech Progress Note Patient Details:  Dustin Marquez 10/06/1981 [id number]  Ortho Devices Type of Ortho Device: Crutches Ortho Device/Splint Interventions: Application   Post Interventions Patient Tolerated: Well Instructions Provided: Care of device   Nikki DomCrawford, Camellia Popescu 07/04/2018, 2:37 PM
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
## 2018-08-10 IMAGING — DX DG CHEST 1V PORT
1 series · 1 of 1 positions shown · non-contrast
Comparison: 09/12/2017; 09/06/2017

CLINICAL DATA: Hypoxemia

EXAM:
PORTABLE CHEST 1 VIEW

[chest ap]
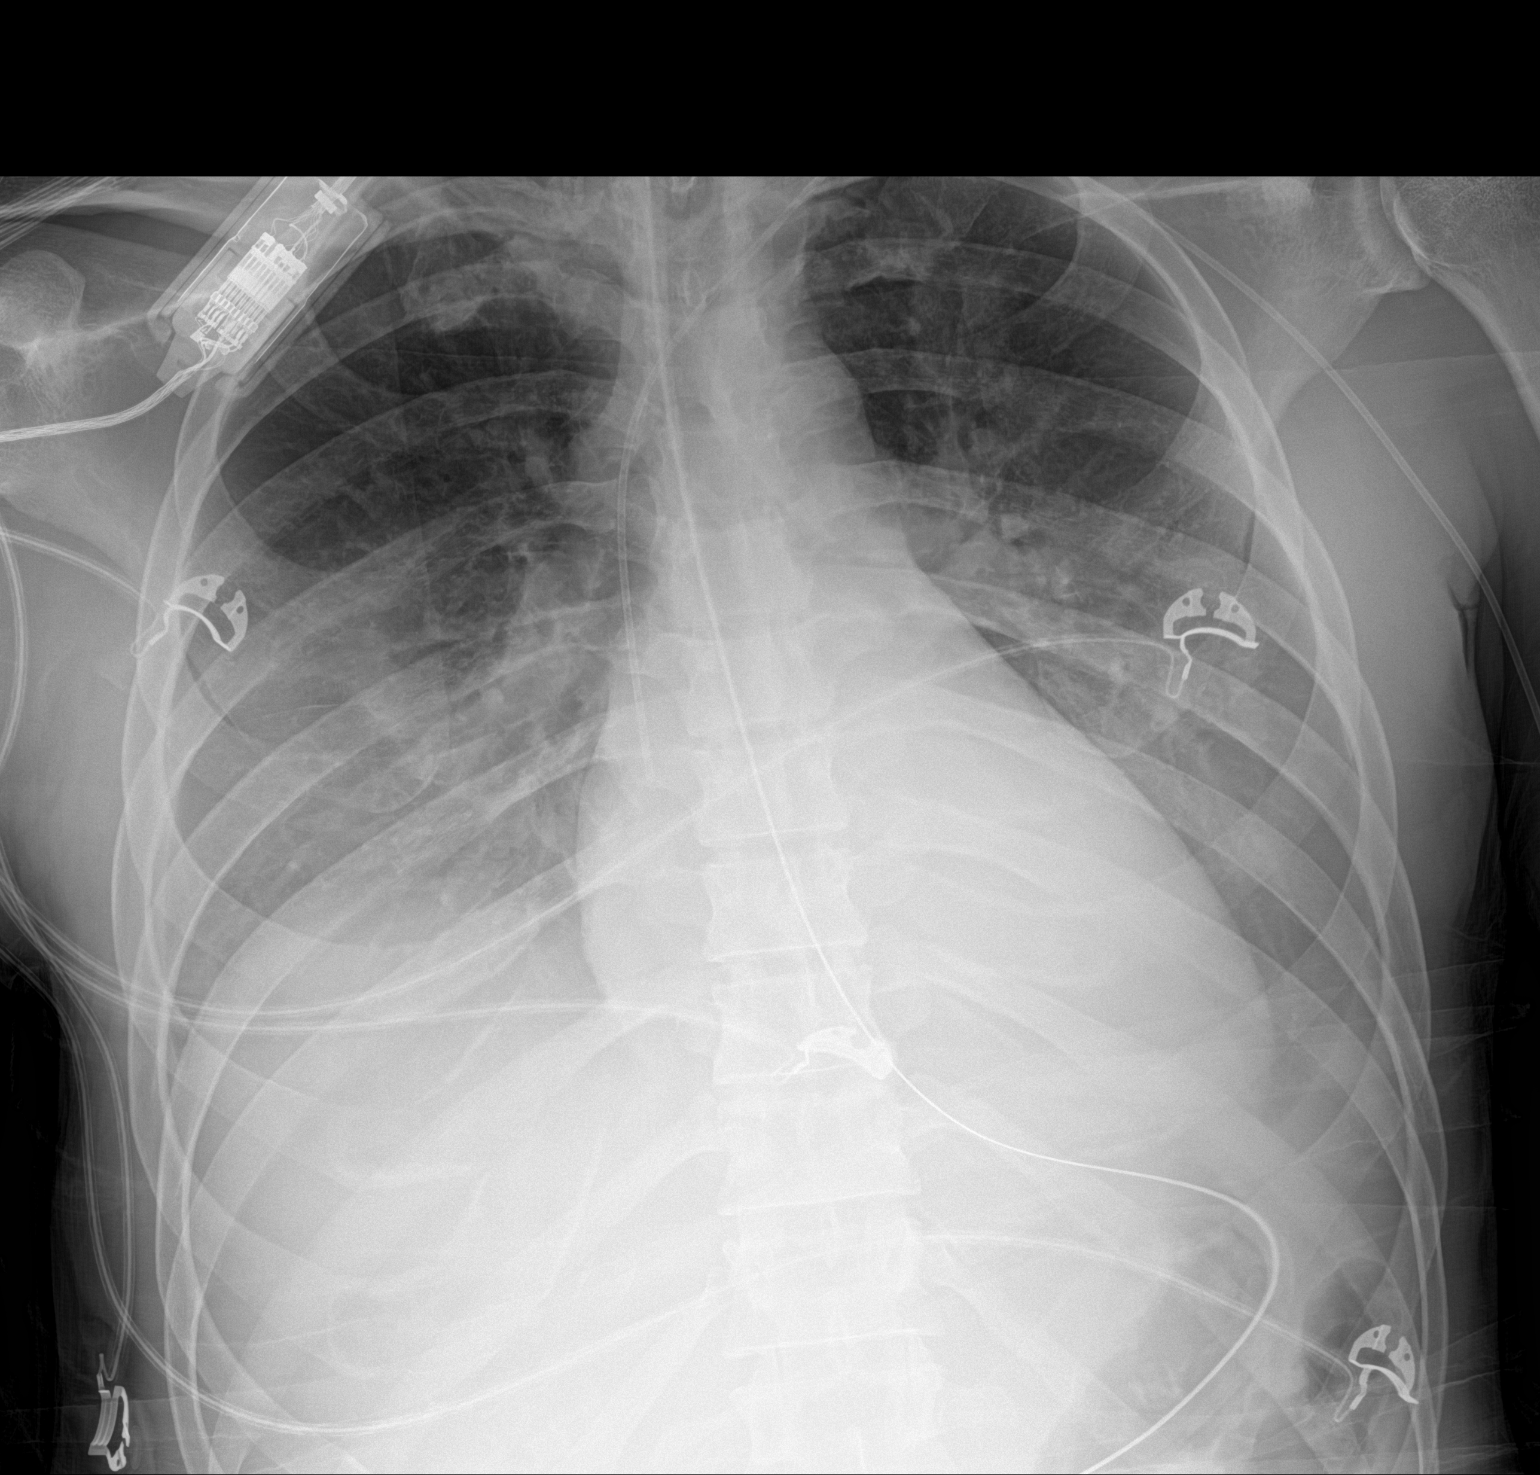

[1 of 1 positions shown; findings below may reference images not displayed]

FINDINGS: Grossly unchanged cardiac silhouette and mediastinal contours.
Stable positioning of support apparatus. The pulmonary vasculature
remains indistinct with cephalization of flow. Suspected increase in
size of now (at least) small sized layering bilateral effusions and
associated bibasilar opacities. No new focal airspace opacities. No
definite pneumothorax. No acute osseus abnormalities.
IMPRESSION: Similar findings of pulmonary edema with suspected increase in size
of now (at least) small sized layering effusions and worsening
bibasilar opacities, atelectasis versus infiltrate.
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
## 2018-09-15 ENCOUNTER — Encounter (HOSPITAL_COMMUNITY): Payer: Self-pay | Admitting: Emergency Medicine

## 2018-09-15 DIAGNOSIS — Z7902 Long term (current) use of antithrombotics/antiplatelets: Secondary | ICD-10-CM

## 2018-09-15 DIAGNOSIS — M25571 Pain in right ankle and joints of right foot: Secondary | ICD-10-CM | POA: Diagnosis present

## 2018-09-15 DIAGNOSIS — I10 Essential (primary) hypertension: Secondary | ICD-10-CM | POA: Diagnosis present

## 2018-09-15 DIAGNOSIS — F17210 Nicotine dependence, cigarettes, uncomplicated: Secondary | ICD-10-CM | POA: Diagnosis present

## 2018-09-15 DIAGNOSIS — Z79899 Other long term (current) drug therapy: Secondary | ICD-10-CM

## 2018-09-15 DIAGNOSIS — Z888 Allergy status to other drugs, medicaments and biological substances status: Secondary | ICD-10-CM

## 2018-09-15 DIAGNOSIS — M009 Pyogenic arthritis, unspecified: Secondary | ICD-10-CM | POA: Diagnosis present

## 2018-09-15 DIAGNOSIS — B9561 Methicillin susceptible Staphylococcus aureus infection as the cause of diseases classified elsewhere: Secondary | ICD-10-CM | POA: Diagnosis present

## 2018-09-15 DIAGNOSIS — I69354 Hemiplegia and hemiparesis following cerebral infarction affecting left non-dominant side: Secondary | ICD-10-CM

## 2018-09-15 DIAGNOSIS — S82201K Unspecified fracture of shaft of right tibia, subsequent encounter for closed fracture with nonunion: Secondary | ICD-10-CM

## 2018-09-15 DIAGNOSIS — Z5329 Procedure and treatment not carried out because of patient's decision for other reasons: Secondary | ICD-10-CM

## 2018-09-15 DIAGNOSIS — G8929 Other chronic pain: Secondary | ICD-10-CM | POA: Diagnosis present

## 2018-09-15 DIAGNOSIS — K5900 Constipation, unspecified: Secondary | ICD-10-CM | POA: Diagnosis not present

## 2018-09-15 DIAGNOSIS — F3181 Bipolar II disorder: Secondary | ICD-10-CM | POA: Diagnosis present

## 2018-09-15 DIAGNOSIS — M86661 Other chronic osteomyelitis, right tibia and fibula: Principal | ICD-10-CM | POA: Diagnosis present

## 2018-09-15 DIAGNOSIS — R946 Abnormal results of thyroid function studies: Secondary | ICD-10-CM | POA: Diagnosis not present

## 2018-09-15 LAB — COMPREHENSIVE METABOLIC PANEL
ALBUMIN: 4.5 g/dL (ref 3.5–5.0)
ALK PHOS: 38 U/L (ref 38–126)
ALT: 37 U/L (ref 0–44)
ANION GAP: 12 (ref 5–15)
AST: 32 U/L (ref 15–41)
BILIRUBIN TOTAL: 1.3 mg/dL — AB (ref 0.3–1.2)
BUN: 14 mg/dL (ref 6–20)
CO2: 25 mmol/L (ref 22–32)
Calcium: 9.7 mg/dL (ref 8.9–10.3)
Chloride: 105 mmol/L (ref 98–111)
Creatinine, Ser: 0.92 mg/dL (ref 0.61–1.24)
GFR calc Af Amer: >60 mL/min (ref >60)
GFR calc non Af Amer: >60 mL/min (ref >60)
GLUCOSE: 95 mg/dL (ref 70–99)
POTASSIUM: 4.2 mmol/L (ref 3.5–5.1)
SODIUM: 142 mmol/L (ref 135–145)
TOTAL PROTEIN: 8.4 g/dL — AB (ref 6.5–8.1)

## 2018-09-15 LAB — CBC WITH DIFFERENTIAL/PLATELET
Basophils Absolute: 0 10*3/uL (ref 0.0–0.1)
Basophils Relative: 0 %
Eosinophils Absolute: 0.2 10*3/uL (ref 0.0–0.7)
Eosinophils Relative: 2 %
HEMATOCRIT: 46.5 % (ref 39.0–52.0)
Hemoglobin: 15.4 g/dL (ref 13.0–17.0)
LYMPHS PCT: 21 %
Lymphs Abs: 1.9 10*3/uL (ref 0.7–4.0)
MCH: 30 pg (ref 26.0–34.0)
MCHC: 33.1 g/dL (ref 30.0–36.0)
MCV: 90.6 fL (ref 78.0–100.0)
MONO ABS: 0.9 10*3/uL (ref 0.1–1.0)
MONOS PCT: 9 %
NEUTROS ABS: 6.1 10*3/uL (ref 1.7–7.7)
Neutrophils Relative %: 68 %
PLATELETS: 219 10*3/uL (ref 150–400)
RBC: 5.13 MIL/uL (ref 4.22–5.81)
RDW: 14.6 % (ref 11.5–15.5)
WBC: 9.1 10*3/uL (ref 4.0–10.5)

## 2018-09-15 LAB — I-STAT CG4 LACTIC ACID, ED: Lactic Acid, Venous: 1.57 mmol/L (ref 0.5–1.9)

## 2018-09-16 ENCOUNTER — Emergency Department (HOSPITAL_COMMUNITY): Payer: Medicaid Other

## 2018-09-16 ENCOUNTER — Encounter (HOSPITAL_COMMUNITY): Payer: Self-pay | Admitting: Radiology

## 2018-09-16 ENCOUNTER — Other Ambulatory Visit: Payer: Self-pay

## 2018-09-16 ENCOUNTER — Inpatient Hospital Stay (HOSPITAL_COMMUNITY)
Admission: EM | Admit: 2018-09-16 | Discharge: 2018-10-31 | DRG: 493 | Discharge status: Against Medical Advice | Payer: Medicaid Other | Attending: Internal Medicine | Admitting: Internal Medicine

## 2018-09-16 DIAGNOSIS — M86661 Other chronic osteomyelitis, right tibia and fibula: Principal | ICD-10-CM | POA: Diagnosis present

## 2018-09-16 DIAGNOSIS — Z888 Allergy status to other drugs, medicaments and biological substances status: Secondary | ICD-10-CM

## 2018-09-16 DIAGNOSIS — F17210 Nicotine dependence, cigarettes, uncomplicated: Secondary | ICD-10-CM

## 2018-09-16 DIAGNOSIS — F1910 Other psychoactive substance abuse, uncomplicated: Secondary | ICD-10-CM | POA: Diagnosis present

## 2018-09-16 DIAGNOSIS — Z967 Presence of other bone and tendon implants: Secondary | ICD-10-CM

## 2018-09-16 DIAGNOSIS — M869 Osteomyelitis, unspecified: Secondary | ICD-10-CM | POA: Diagnosis present

## 2018-09-16 DIAGNOSIS — T847XXS Infection and inflammatory reaction due to other internal orthopedic prosthetic devices, implants and grafts, sequela: Secondary | ICD-10-CM

## 2018-09-16 DIAGNOSIS — T847XXA Infection and inflammatory reaction due to other internal orthopedic prosthetic devices, implants and grafts, initial encounter: Secondary | ICD-10-CM | POA: Diagnosis present

## 2018-09-16 DIAGNOSIS — Z8781 Personal history of (healed) traumatic fracture: Secondary | ICD-10-CM | POA: Diagnosis not present

## 2018-09-16 DIAGNOSIS — Z9889 Other specified postprocedural states: Secondary | ICD-10-CM

## 2018-09-16 DIAGNOSIS — M8660 Other chronic osteomyelitis, unspecified site: Secondary | ICD-10-CM

## 2018-09-16 DIAGNOSIS — S82409A Unspecified fracture of shaft of unspecified fibula, initial encounter for closed fracture: Secondary | ICD-10-CM

## 2018-09-16 DIAGNOSIS — Z8619 Personal history of other infectious and parasitic diseases: Secondary | ICD-10-CM

## 2018-09-16 DIAGNOSIS — Z8673 Personal history of transient ischemic attack (TIA), and cerebral infarction without residual deficits: Secondary | ICD-10-CM

## 2018-09-16 DIAGNOSIS — S82209A Unspecified fracture of shaft of unspecified tibia, initial encounter for closed fracture: Secondary | ICD-10-CM | POA: Diagnosis present

## 2018-09-16 LAB — URINALYSIS, ROUTINE W REFLEX MICROSCOPIC
BACTERIA UA: NONE SEEN
Glucose, UA: NEGATIVE mg/dL
HGB URINE DIPSTICK: NEGATIVE
Ketones, ur: 20 mg/dL — AB
Nitrite: NEGATIVE
PROTEIN: NEGATIVE mg/dL
Specific Gravity, Urine: 1.033 — ABNORMAL HIGH (ref 1.005–1.030)
pH: 5 (ref 5.0–8.0)

## 2018-09-16 LAB — RAPID URINE DRUG SCREEN, HOSP PERFORMED
Amphetamines: NOT DETECTED
Barbiturates: NOT DETECTED
Benzodiazepines: NOT DETECTED
Cocaine: NOT DETECTED
OPIATES: NOT DETECTED
Tetrahydrocannabinol: NOT DETECTED

## 2018-09-16 MED ORDER — ENOXAPARIN SODIUM 40 MG/0.4ML ~~LOC~~ SOLN
40.0000 mg | SUBCUTANEOUS | Status: DC
  Administered 2018-09-16 – 2018-10-30 (×45): 40 mg via SUBCUTANEOUS
  Filled 2018-09-16 (×45): qty 0.4

## 2018-09-16 MED ORDER — VANCOMYCIN HCL IN DEXTROSE 1-5 GM/200ML-% IV SOLN
1000.0000 mg | Freq: Once | INTRAVENOUS | Status: AC
  Administered 2018-09-16: 1000 mg via INTRAVENOUS
  Filled 2018-09-16: qty 200

## 2018-09-16 MED ORDER — CLOPIDOGREL BISULFATE 75 MG PO TABS
75.0000 mg | ORAL_TABLET | Freq: Every day | ORAL | Status: DC
  Administered 2018-09-16 – 2018-09-18 (×3): 75 mg via ORAL
  Filled 2018-09-16 (×3): qty 1

## 2018-09-16 MED ORDER — NICOTINE 21 MG/24HR TD PT24
21.0000 mg | MEDICATED_PATCH | Freq: Every day | TRANSDERMAL | Status: DC
  Administered 2018-09-17 – 2018-10-29 (×15): 21 mg via TRANSDERMAL
  Filled 2018-09-16 (×28): qty 1

## 2018-09-16 MED ORDER — GABAPENTIN 300 MG PO CAPS
300.0000 mg | ORAL_CAPSULE | Freq: Two times a day (BID) | ORAL | Status: DC
  Administered 2018-09-16 – 2018-10-30 (×87): 300 mg via ORAL
  Filled 2018-09-16 (×88): qty 1

## 2018-09-16 MED ORDER — MUPIROCIN 2 % EX OINT
1.0000 "application " | TOPICAL_OINTMENT | Freq: Two times a day (BID) | CUTANEOUS | Status: AC
  Administered 2018-09-17 – 2018-09-18 (×4): 1 via NASAL
  Filled 2018-09-16: qty 22

## 2018-09-16 MED ORDER — SODIUM CHLORIDE 0.9 % IV SOLN
2.0000 g | Freq: Once | INTRAVENOUS | Status: AC
  Administered 2018-09-16: 2 g via INTRAVENOUS
  Filled 2018-09-16: qty 2

## 2018-09-16 MED ORDER — GADOBUTROL 1 MMOL/ML IV SOLN
7.0000 mL | Freq: Once | INTRAVENOUS | Status: AC | PRN
  Administered 2018-09-16: 7.5 mL via INTRAVENOUS

## 2018-09-16 MED ORDER — HYDROXYZINE HCL 25 MG PO TABS
50.0000 mg | ORAL_TABLET | Freq: Four times a day (QID) | ORAL | Status: DC | PRN
  Administered 2018-09-21 – 2018-10-24 (×12): 50 mg via ORAL
  Filled 2018-09-16 (×13): qty 2

## 2018-09-16 MED ORDER — SODIUM CHLORIDE 0.9 % IJ SOLN
INTRAMUSCULAR | Status: AC
  Filled 2018-09-16: qty 50

## 2018-09-16 MED ORDER — KETOROLAC TROMETHAMINE 30 MG/ML IJ SOLN
15.0000 mg | Freq: Once | INTRAMUSCULAR | Status: AC
  Administered 2018-09-16: 15 mg via INTRAVENOUS
  Filled 2018-09-16: qty 1

## 2018-09-16 MED ORDER — IOHEXOL 300 MG/ML  SOLN
100.0000 mL | Freq: Once | INTRAMUSCULAR | Status: AC | PRN
  Administered 2018-09-16: 100 mL via INTRAVENOUS
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
## 2018-09-16 NOTE — ED Notes (Signed)
Patient transported to MRI 

## 2018-09-16 NOTE — ED Provider Notes (Signed)
Care assumed from previous provider PA Upstill. Please see note for further details. Briefly, patient is a 37 y.o. male with complex PMH. Initially injured right ankle in 11/2017.  Fortunately, he developed a postop infection to the right ankle requiring extended rehab stay and IV antibiotics for Pseudomonas positive culture at that time.  He was discharged with PICC line to receive continued IV antibiotics, however was lost to follow-up.  He came to the emergency department on 01/24/18 to have PICC line removed which was done.  He is afebrile with reassuring labs today.  Cultures were obtained and pending. He came in for acute worsening of chronic ankle pain and recurrent wound which is now draining purulent discharge. He received a dose of vancomycin and cefepime in the emergency department last night. Case discussed, plan agreed upon. Will follow up on pending CT to assess for osteo (MRI not available last night). Will likely need ortho consultation once imaging results.   CT showing subcutaneous edema without definitive drainable abscess.  No definitive signs of osteo-either.  Consulted orthopedics who recommends obtaining MRI.  If negative, can follow-up with orthopedics as an outpatient.  MRI reviewed showing findings consistent with chronic osteomyelitis.  I discussed findings with orthopedics, Earney HamburgMichael Jeffrey, who recommends admission for PICC line placement, ID consult and orthopedics will follow.  Possibly will need hardware removed.  Will consult medicine for admission.  Hospitalist will admit.    Tayvian Holycross, Chase PicketJaime Pilcher, PA-C 09/16/18 1316    Derwood KaplanNanavati, Ankit, MD 09/17/18 [phone]

## 2018-09-16 NOTE — ED Notes (Signed)
5W to call for report in 5-10mins

## 2018-09-16 NOTE — Plan of Care (Signed)
?  Problem: Education: ?Goal: Knowledge of General Education information will improve ?Description: Including pain rating scale, medication(s)/side effects and non-pharmacologic comfort measures ?Outcome: Progressing ?  ?Problem: Health Behavior/Discharge Planning: ?Goal: Ability to manage health-related needs will improve ?Outcome: Progressing ?  ?Problem: Clinical Measurements: ?Goal: Ability to maintain clinical measurements within normal limits will improve ?Outcome: Progressing ?Goal: Diagnostic test results will improve ?Outcome: Progressing ?  ?Problem: Activity: ?Goal: Risk for activity intolerance will decrease ?Outcome: Progressing ?  ?Problem: Nutrition: ?Goal: Adequate nutrition will be maintained ?Outcome: Progressing ?  ?Problem: Coping: ?Goal: Level of anxiety will decrease ?Outcome: Progressing ?  ?Problem: Elimination: ?Goal: Will not experience complications related to bowel motility ?Outcome: Progressing ?Goal: Will not experience complications related to urinary retention ?Outcome: Progressing ?  ?Problem: Pain Managment: ?Goal: General experience of comfort will improve ?Outcome: Progressing ?  ?Problem: Safety: ?Goal: Ability to remain free from injury will improve ?Outcome: Progressing ?  ?Problem: Skin Integrity: ?Goal: Risk for impaired skin integrity will decrease ?Outcome: Progressing ?  ?
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
## 2018-09-16 NOTE — ED Notes (Signed)
ED TO INPATIENT HANDOFF REPORT  Name/Age/Gender Dustin Marquez 37 y.o. male  Code Status Code Status History    Date Active Date Inactive Code Status Order ID Comments User Context   07/02/2018 1335 07/04/2018 1232 Full Code [id number]  Lavella Hammock, MD Inpatient   07/02/2018 1155 07/02/2018 1334 Full Code [id number]  Consuello Closs, NP Inpatient   07/02/2018 0108 07/02/2018 1153 Full Code [id number]  Dalia Heading, PA-C ED   09/06/2017 0723 11/12/2017 1407 Full Code [id number]  Georganna Skeans, MD Inpatient   09/15/2013 0352 09/16/2013 1633 Full Code [id number]  Hoy Morn, MD ED   05/04/2013 0909 05/05/2013 0234 Full Code [id number]  Wandra Arthurs, MD ED      Home/SNF/Other Home  Chief Complaint ankle swelling   Level of Care/Admitting Diagnosis ED Disposition    ED Disposition Condition [place] [facility] Area: [facility] [[id number]]  Level of Care: Med-Surg [16]  Diagnosis: Osteomyelitis [facility]) [[id number]]  Admitting Physician: Caren Griffins [phone]  Attending Physician: Caren Griffins [[id number]]  PT Class (Do Not Modify): Observation [104]  PT Acc Code (Do Not Modify): Observation [10022]       Medical History Past Medical History:  Diagnosis Date  . Bipolar 2 disorder ([place])   . Depression   . Hypertension   . Jaw dislocation   . Opiate dependence ([place]) 11/28/2017    Allergies Allergies  Allergen Reactions  . Chlorhexidine Rash    CHG wipes in ICU caused rash that persisted until they were discontinued.  . Lamictal [Lamotrigine] Rash    IV Location/Drains/Wounds Patient Lines/Drains/Airways Status   Active Line/Drains/Airways    Name:   Placement date:   Placement time:   Site:   Days:   Peripheral IV 09/16/18 Right Forearm   09/16/18    0241    Forearm   less than 1   Incision (Closed) 09/19/17 Leg   09/19/17    1223     362   Incision (Closed) 10/02/17 Ankle Right   10/02/17    1159     349   Wound / Incision (Open or  Dehisced) 09/09/17 Laceration;Non-pressure wound Arm Right;Posterior "road rash" and lacerations on right arm   09/09/17    2000    Arm   372          Labs/Imaging Results for orders placed or performed during the hospital encounter of 09/16/18 (from the past 48 hour(s))  Comprehensive metabolic panel     Status: Abnormal   Collection Time: 09/15/18  8:02 PM  Result Value Ref Range   Sodium 142 135 - 145 mmol/L   Potassium 4.2 3.5 - 5.1 mmol/L   Chloride 105 98 - 111 mmol/L   CO2 25 22 - 32 mmol/L   Glucose, Bld 95 70 - 99 mg/dL   BUN 14 6 - 20 mg/dL   Creatinine, Ser 0.92 0.61 - 1.24 mg/dL   Calcium 9.7 8.9 - 10.3 mg/dL   Total Protein 8.4 (H) 6.5 - 8.1 g/dL   Albumin 4.5 3.5 - 5.0 g/dL   AST 32 15 - 41 U/L   ALT 37 0 - 44 U/L   Alkaline Phosphatase 38 38 - 126 U/L   Total Bilirubin 1.3 (H) 0.3 - 1.2 mg/dL   GFR calc non Af Amer >60 >60 mL/min   GFR calc Af Amer >60 >60 mL/min    Comment: (NOTE) The eGFR has been calculated using the CKD EPI  equation. This calculation has not been validated in all clinical situations. eGFR's persistently <60 mL/min signify possible Chronic Kidney Disease.    Anion gap 12 5 - 15    Comment: Performed at [facility], [place] [address] [place], [place] [address]  CBC with Differential     Status: None   Collection Time: 09/15/18  8:02 PM  Result Value Ref Range   WBC 9.1 4.0 - 10.5 K/uL   RBC 5.13 4.22 - 5.81 MIL/uL   Hemoglobin 15.4 13.0 - 17.0 g/dL   HCT 46.5 39.0 - 52.0 %   MCV 90.6 78.0 - 100.0 fL   MCH 30.0 26.0 - 34.0 pg   MCHC 33.1 30.0 - 36.0 g/dL   RDW 14.6 11.5 - 15.5 %   Platelets 219 150 - 400 K/uL   Neutrophils Relative % 68 %   Neutro Abs 6.1 1.7 - 7.7 K/uL   Lymphocytes Relative 21 %   Lymphs Abs 1.9 0.7 - 4.0 K/uL   Monocytes Relative 9 %   Monocytes Absolute 0.9 0.1 - 1.0 K/uL   Eosinophils Relative 2 %   Eosinophils Absolute 0.2 0.0 - 0.7 K/uL   Basophils Relative 0 %   Basophils Absolute 0.0  0.0 - 0.1 K/uL    Comment: Performed at [facility], [place] [address], [place], [place] [address]  I-Stat CG4 Lactic Acid, ED     Status: None   Collection Time: 09/15/18  8:08 PM  Result Value Ref Range   Lactic Acid, Venous 1.57 0.5 - 1.9 mmol/L  Urinalysis, Routine w reflex microscopic     Status: Abnormal   Collection Time: 09/16/18  1:42 AM  Result Value Ref Range   Color, Urine AMBER (A) YELLOW    Comment: BIOCHEMICALS MAY BE AFFECTED BY COLOR   APPearance CLEAR CLEAR   Specific Gravity, Urine 1.033 (H) 1.005 - 1.030   pH 5.0 5.0 - 8.0   Glucose, UA NEGATIVE NEGATIVE mg/dL   Hgb urine dipstick NEGATIVE NEGATIVE   Bilirubin Urine SMALL (A) NEGATIVE   Ketones, ur 20 (A) NEGATIVE mg/dL   Protein, ur NEGATIVE NEGATIVE mg/dL   Nitrite NEGATIVE NEGATIVE   Leukocytes, UA TRACE (A) NEGATIVE   RBC / HPF 0-5 0 - 5 RBC/hpf   WBC, UA 6-10 0 - 5 WBC/hpf   Bacteria, UA NONE SEEN NONE SEEN   Squamous Epithelial / LPF 0-5 0 - 5   Mucus PRESENT    Hyaline Casts, UA PRESENT     Comment: Performed at [facility], [place] [address] [place], [place] [address]  Urine rapid drug screen (hosp performed)     Status: None   Collection Time: 09/16/18  1:42 AM  Result Value Ref Range   Opiates NONE DETECTED NONE DETECTED   Cocaine NONE DETECTED NONE DETECTED   Benzodiazepines NONE DETECTED NONE DETECTED   Amphetamines NONE DETECTED NONE DETECTED   Tetrahydrocannabinol NONE DETECTED NONE DETECTED   Barbiturates NONE DETECTED NONE DETECTED    Comment: (NOTE) DRUG SCREEN FOR MEDICAL PURPOSES ONLY.  IF CONFIRMATION IS NEEDED FOR ANY PURPOSE, NOTIFY LAB WITHIN 5 DAYS. LOWEST DETECTABLE LIMITS FOR URINE DRUG SCREEN Drug Class                     Cutoff (ng/mL) Amphetamine and metabolites    1000 Barbiturate and metabolites    200 Benzodiazepine  254 Tricyclics and metabolites     300 Opiates and metabolites        300 Cocaine and metabolites         300 THC                            50 Performed at [facility], [place] [address], [place], [place] [address]   Wound or Superficial Culture     Status: None (Preliminary result)   Collection Time: 09/16/18  4:42 AM  Result Value Ref Range   Specimen Description      WOUND RIGHT LEG Performed at [place] [address], [place], [place] [address]    Special Requests      Normal Performed at [facility], [place] [address], [place], [place] [address]    Gram Stain      RARE WBC PRESENT, PREDOMINANTLY PMN NO ORGANISMS SEEN Performed at [facility], [place] [address] [place], [place] [address]    Culture PENDING    Report Status PENDING    Dg Tibia/fibula Right  Result Date: 09/16/2018 CLINICAL DATA:  38 year old male with recent trauma and surgery on the right lower extremity presenting with increased pain and drainage. EXAM: RIGHT TIBIA AND FIBULA - 2 VIEW COMPARISON:  Right lower extremity radiograph dated 06/19/2018 FINDINGS: There is no acute fracture or dislocation. Old healed proximal fibular diaphysis fracture. Distal tibial fixation sideplate and screws noted. The orthopedic hardware appears intact. There is approximately 2 mm gap between the distal aspect of the sideplate and bone cortex similar to the prior radiograph. Interval progression of healing of the fracture with bridging bone formation. There is osteopenia of the ankle and distal leg secondary to disuse. The ankle mortise remains intact. There is increased soft tissue swelling over the medial ankle and sideplate. Clinical correlation is recommended to evaluate for possibility of an infectious process. IMPRESSION: 1. Soft tissue swelling over the medial ankle and sideplate with infiltration of the subcutaneous fat, new since the prior radiograph. Clinical correlation is recommended to evaluate for an infectious process. 2. Intact appearance of the fixation  hardware. A 2 mm gap between the sideplate and bone cortex is similar to prior radiograph. 3. No acute fracture or dislocation. Interval progression of fracture healing. Electronically Signed   By: Anner Crete M.D.   On: 09/16/2018 03:07   Ct Tibia Fibula Right W Contrast  Result Date: 09/16/2018 CLINICAL DATA:  Nonhealing wound. Prior operative reduction and internal fixation on 09/19/2017 EXAM: CT OF THE LOWER RIGHT EXTREMITY WITH CONTRAST TECHNIQUE: Multidetector CT imaging of the lower right extremity was performed according to the standard protocol following intravenous contrast administration. COMPARISON:  09/16/2018 CONTRAST:  164m OMNIPAQUE IOHEXOL 300 MG/ML  SOLN FINDINGS: Bones/Joint/Cartilage Old healed fibular fractures including a proximal diaphyseal fracture with some residual deformity, as well as some deformity of the distal tip of the fibula with associated spurring. Medial tibial plate and screw fixator without any abnormal lucency along the screw fixators, an without visible hardware failure. This traverses a partially healed oblique fracture of the distal tibial metadiaphysis. Two of the distal-most screws extend across the distal tibiofibular articulation and into the distal fibula metaphysis. Ligaments Suboptimally assessed by CT. Muscles and Tendons The distal tibial plate lips out over the posterior margin of the medial malleolus by 6 mm as shown on image 280/5. The tibialis posterior tendon extends in the resulting  groove between the medial malleolus and the plate. Plate does hook in laterally at its bottom and could be impinging on the distal tibialis posterior tendon, which does appear abnormally thickened distal to the plate. Soft tissues Streak artifact from the plate and screw fixator somewhat limits the assessment of the soft tissues. There is clearly subcutaneous edema and swelling overlying the plate and screw fixator especially inferiorly. I do not appreciated drainable  abscess. There is peritendinitis or tenosynovitis of the distal tibialis posterior tendon. No thrombosis of regional superficial venous structures identified. No observed gas in the soft tissues. IMPRESSION: 1. There is subcutaneous edema overlying the plate and screw fixator but I do not observe a definite drainable abscess. Sensitivity for smaller abscesses is reduced due to streak artifact. No gas in the soft tissues. 2. The plate lips out posteriorly 6 mm behind the medial malleolus. The tibialis posterior tendon is in the resulting groove. The plate hooks in laterally at its inferior margin and is probably impinging on the tibialis posterior tendon. The tendon appears thickened and increased in diameter distal to this location. 3. Partial union of the oblique distal tibial fracture traversed by the plate. Old healed fibular fractures. Electronically Signed   By: Van Clines M.D.   On: 09/16/2018 08:41   Mr Ankle Right W Wo Contrast  Result Date: 09/16/2018 CLINICAL DATA:  Right ankle pain and swelling.  Soft tissue wound. EXAM: MRI OF THE RIGHT ANKLE WITHOUT AND WITH CONTRAST TECHNIQUE: Multiplanar, multisequence MR imaging of the ankle was performed before and after the administration of intravenous contrast. CONTRAST:  7.5 cc Gadavist COMPARISON:  CT scan dated 09/16/2018, radiographs dated 05/23/2018 and CT scan dated 09/07/2017 FINDINGS: TENDONS Peroneal: Normal. Posteromedial: Normal. Anterior: Normal. Achilles: Normal. Plantar Fascia: Normal. LIGAMENTS Lateral: Intact. Medial: Intact. CARTILAGE Ankle Joint: Slight joint space narrowing. Tiny marginal osteophytes on the anterior and posterior aspects of the distal tibia. No joint effusion. Subtalar Joints/Sinus Tarsi: Normal. Bones: There is incomplete healing of the oblique fracture of the distal tibial shaft. Prior CT scan demonstrates extensive solid areas of bone union but there is an area of irregular abnormal increased signal intensity on  T2 weighted imaging with what appear to be 2 small bony sequestra. This most likely represents chronic osteomyelitis. There is no loosening of the hardware in the distal tibia. No definable soft tissue ulceration or subcutaneous abscess. There is skin thickening overlying the medial aspect of the distal tibia. Other: None IMPRESSION: IMPRESSION 1. Findings consistent with chronic osteomyelitis involving the distal tibial shaft at the site of prior fracture with 2 small bony sequestrations. 2. No discrete soft tissue abscess. Electronically Signed   By: Lorriane Shire M.D.   On: 09/16/2018 12:00    Pending Labs Unresulted Labs (From admission, onward)    Start     Ordered   Signed and Held  HIV antibody (Routine Testing)  Once,   R     Signed and Held          Vitals/Pain Today's Vitals   09/16/18 1203 09/16/18 1300 09/16/18 1330 09/16/18 1400  BP: 114/78 105/65  105/62  Pulse: 60 (!) 53 69 75  Resp: 16     Temp:      TempSrc:      SpO2: 100% 100% 100% 99%  Weight:      Height:      PainSc:        Isolation Precautions No active isolations  Medications Medications  sodium chloride  0.9 % injection (has no administration in time range)  ceFEPIme (MAXIPIME) 2 g in sodium chloride 0.9 % 100 mL IVPB (0 g Intravenous Stopped 09/16/18 0314)  ketorolac (TORADOL) 30 MG/ML injection 15 mg (15 mg Intravenous Given 09/16/18 0242)  vancomycin (VANCOCIN) IVPB 1000 mg/200 mL premix (0 mg Intravenous Stopped 09/16/18 0809)  iohexol (OMNIPAQUE) 300 MG/ML solution 100 mL (100 mLs Intravenous Contrast Given 09/16/18 0723)  gadobutrol (GADAVIST) 1 MMOL/ML injection 7 mL (7.5 mLs Intravenous Contrast Given 09/16/18 1139)    Mobility walks

## 2018-09-16 NOTE — Consult Note (Signed)
Reason for Consult:Right ankle infection Referring Physician: A Dustin Marquez is an 37 y.o. male.  HPI: Dustin Marquez came to the ED with a 2d hx/o right ankle pain/swelling. He has a hx/o chronic wounds around that ankle ever since ORIF last year. His last infection was about a week ago but was higher in the leg than this one. That one cleared up with abx. He denies fevers, chills, and sweats but has had some nausea. Apparently while the RN was examining him the lesion drained purulent material.  Past Medical History:  Diagnosis Date  . Bipolar 2 disorder ([place])   . Depression   . Hypertension   . Jaw dislocation   . Opiate dependence ([place]) 11/28/2017    Past Surgical History:  Procedure Laterality Date  . EXTERNAL FIXATION LEG Right 09/09/2017   Procedure: EXTERNAL FIXATION RIGHT LOWER LEG;  Surgeon: Hiram Gash, MD;  Location: [facility];  Service: Orthopedics;  Laterality: Right;  . FRACTURE SURGERY    . I&D EXTREMITY Right 10/02/2017   Procedure: IRRIGATION AND DEBRIDEMENT EXTREMITY;  Surgeon: Hiram Gash, MD;  Location: [facility];  Service: Orthopedics;  Laterality: Right;  . IR ANGIO EXTERNAL CAROTID SEL EXT CAROTID UNI L MOD SED  09/06/2017  . IR ANGIO INTRA EXTRACRAN SEL COM CAROTID INNOMINATE BILAT MOD SED  09/06/2017  . IR ANGIO VERTEBRAL SEL SUBCLAVIAN INNOMINATE BILAT MOD SED  09/06/2017  . ORIF TIBIA FRACTURE Right 09/19/2017   Procedure: OPEN REDUCTION INTERNAL FIXATION (ORIF) TIBIA FRACTURE;  Surgeon: Hiram Gash, MD;  Location: [facility];  Service: Orthopedics;  Laterality: Right;    No family history on file.  Social History:  reports that he has been smoking cigarettes. He has been smoking about 0.50 packs per day. He has never used smokeless tobacco. He reports that he drinks alcohol. He reports that he has current or past drug history. Drug: Cocaine. Frequency: 2.00 times per week.  Allergies:  Allergies  Allergen Reactions  . Chlorhexidine Rash    CHG wipes in  ICU caused rash that persisted until they were discontinued.  . Lamictal [Lamotrigine] Rash    Medications: I have reviewed the patient's current medications.  Results for orders placed or performed during the hospital encounter of 09/16/18 (from the past 48 hour(s))  Comprehensive metabolic panel     Status: Abnormal   Collection Time: 09/15/18  8:02 PM  Result Value Ref Range   Sodium 142 135 - 145 mmol/L   Potassium 4.2 3.5 - 5.1 mmol/L   Chloride 105 98 - 111 mmol/L   CO2 25 22 - 32 mmol/L   Glucose, Bld 95 70 - 99 mg/dL   BUN 14 6 - 20 mg/dL   Creatinine, Ser 0.92 0.61 - 1.24 mg/dL   Calcium 9.7 8.9 - 10.3 mg/dL   Total Protein 8.4 (H) 6.5 - 8.1 g/dL   Albumin 4.5 3.5 - 5.0 g/dL   AST 32 15 - 41 U/L   ALT 37 0 - 44 U/L   Alkaline Phosphatase 38 38 - 126 U/L   Total Bilirubin 1.3 (H) 0.3 - 1.2 mg/dL   GFR calc non Af Amer >60 >60 mL/min   GFR calc Af Amer >60 >60 mL/min    Comment: (NOTE) The eGFR has been calculated using the CKD EPI equation. This calculation has not been validated in all clinical situations. eGFR's persistently <60 mL/min signify possible Chronic Kidney Disease.    Anion gap 12 5 - 15  Comment: Performed at [facility], [place] [address], [place], [place] [address]  CBC with Differential     Status: None   Collection Time: 09/15/18  8:02 PM  Result Value Ref Range   WBC 9.1 4.0 - 10.5 K/uL   RBC 5.13 4.22 - 5.81 MIL/uL   Hemoglobin 15.4 13.0 - 17.0 g/dL   HCT 46.5 39.0 - 52.0 %   MCV 90.6 78.0 - 100.0 fL   MCH 30.0 26.0 - 34.0 pg   MCHC 33.1 30.0 - 36.0 g/dL   RDW 14.6 11.5 - 15.5 %   Platelets 219 150 - 400 K/uL   Neutrophils Relative % 68 %   Neutro Abs 6.1 1.7 - 7.7 K/uL   Lymphocytes Relative 21 %   Lymphs Abs 1.9 0.7 - 4.0 K/uL   Monocytes Relative 9 %   Monocytes Absolute 0.9 0.1 - 1.0 K/uL   Eosinophils Relative 2 %   Eosinophils Absolute 0.2 0.0 - 0.7 K/uL   Basophils Relative 0 %   Basophils Absolute 0.0 0.0 -  0.1 K/uL    Comment: Performed at [facility], [place] [address] [place], [place] [address]  I-Stat CG4 Lactic Acid, ED     Status: None   Collection Time: 09/15/18  8:08 PM  Result Value Ref Range   Lactic Acid, Venous 1.57 0.5 - 1.9 mmol/L  Urinalysis, Routine w reflex microscopic     Status: Abnormal   Collection Time: 09/16/18  1:42 AM  Result Value Ref Range   Color, Urine AMBER (A) YELLOW    Comment: BIOCHEMICALS MAY BE AFFECTED BY COLOR   APPearance CLEAR CLEAR   Specific Gravity, Urine 1.033 (H) 1.005 - 1.030   pH 5.0 5.0 - 8.0   Glucose, UA NEGATIVE NEGATIVE mg/dL   Hgb urine dipstick NEGATIVE NEGATIVE   Bilirubin Urine SMALL (A) NEGATIVE   Ketones, ur 20 (A) NEGATIVE mg/dL   Protein, ur NEGATIVE NEGATIVE mg/dL   Nitrite NEGATIVE NEGATIVE   Leukocytes, UA TRACE (A) NEGATIVE   RBC / HPF 0-5 0 - 5 RBC/hpf   WBC, UA 6-10 0 - 5 WBC/hpf   Bacteria, UA NONE SEEN NONE SEEN   Squamous Epithelial / LPF 0-5 0 - 5   Mucus PRESENT    Hyaline Casts, UA PRESENT     Comment: Performed at [facility], [place] [address] [place], [place] [address]  Urine rapid drug screen (hosp performed)     Status: None   Collection Time: 09/16/18  1:42 AM  Result Value Ref Range   Opiates NONE DETECTED NONE DETECTED   Cocaine NONE DETECTED NONE DETECTED   Benzodiazepines NONE DETECTED NONE DETECTED   Amphetamines NONE DETECTED NONE DETECTED   Tetrahydrocannabinol NONE DETECTED NONE DETECTED   Barbiturates NONE DETECTED NONE DETECTED    Comment: (NOTE) DRUG SCREEN FOR MEDICAL PURPOSES ONLY.  IF CONFIRMATION IS NEEDED FOR ANY PURPOSE, NOTIFY LAB WITHIN 5 DAYS. LOWEST DETECTABLE LIMITS FOR URINE DRUG SCREEN Drug Class                     Cutoff (ng/mL) Amphetamine and metabolites    1000 Barbiturate and metabolites    200 Benzodiazepine                 854 Tricyclics and metabolites     300 Opiates and metabolites        300 Cocaine and metabolites  300 THC                            50 Performed at [facility], [place] [address] [place], [place] [address]   Wound or Superficial Culture     Status: None (Preliminary result)   Collection Time: 09/16/18  4:42 AM  Result Value Ref Range   Specimen Description      WOUND RIGHT LEG Performed at [place] [address] [place], [place] [address]    Special Requests      Normal Performed at [facility], [place] [address] [place], [place] [address]    Gram Stain      RARE WBC PRESENT, PREDOMINANTLY PMN NO ORGANISMS SEEN Performed at [facility], [place] [address] [place], [place] [address]    Culture PENDING    Report Status PENDING     Dg Tibia/fibula Right  Result Date: 09/16/2018 CLINICAL DATA:  37 year old male with recent trauma and surgery on the right lower extremity presenting with increased pain and drainage. EXAM: RIGHT TIBIA AND FIBULA - 2 VIEW COMPARISON:  Right lower extremity radiograph dated 06/19/2018 FINDINGS: There is no acute fracture or dislocation. Old healed proximal fibular diaphysis fracture. Distal tibial fixation sideplate and screws noted. The orthopedic hardware appears intact. There is approximately 2 mm gap between the distal aspect of the sideplate and bone cortex similar to the prior radiograph. Interval progression of healing of the fracture with bridging bone formation. There is osteopenia of the ankle and distal leg secondary to disuse. The ankle mortise remains intact. There is increased soft tissue swelling over the medial ankle and sideplate. Clinical correlation is recommended to evaluate for possibility of an infectious process. IMPRESSION: 1. Soft tissue swelling over the medial ankle and sideplate with infiltration of the subcutaneous fat, new since the prior radiograph. Clinical correlation is recommended to evaluate for an infectious process. 2. Intact appearance of the fixation  hardware. A 2 mm gap between the sideplate and bone cortex is similar to prior radiograph. 3. No acute fracture or dislocation. Interval progression of fracture healing. Electronically Signed   By: Anner Crete M.D.   On: 09/16/2018 03:07   Ct Tibia Fibula Right W Contrast  Result Date: 09/16/2018 CLINICAL DATA:  Nonhealing wound. Prior operative reduction and internal fixation on 09/19/2017 EXAM: CT OF THE LOWER RIGHT EXTREMITY WITH CONTRAST TECHNIQUE: Multidetector CT imaging of the lower right extremity was performed according to the standard protocol following intravenous contrast administration. COMPARISON:  09/16/2018 CONTRAST:  167m OMNIPAQUE IOHEXOL 300 MG/ML  SOLN FINDINGS: Bones/Joint/Cartilage Old healed fibular fractures including a proximal diaphyseal fracture with some residual deformity, as well as some deformity of the distal tip of the fibula with associated spurring. Medial tibial plate and screw fixator without any abnormal lucency along the screw fixators, an without visible hardware failure. This traverses a partially healed oblique fracture of the distal tibial metadiaphysis. Two of the distal-most screws extend across the distal tibiofibular articulation and into the distal fibula metaphysis. Ligaments Suboptimally assessed by CT. Muscles and Tendons The distal tibial plate lips out over the posterior margin of the medial malleolus by 6 mm as shown on image 280/5. The tibialis posterior tendon extends in the resulting groove between the medial malleolus and the plate. Plate does hook in laterally at its bottom and could be impinging on the distal tibialis posterior tendon, which does appear abnormally  thickened distal to the plate. Soft tissues Streak artifact from the plate and screw fixator somewhat limits the assessment of the soft tissues. There is clearly subcutaneous edema and swelling overlying the plate and screw fixator especially inferiorly. I do not appreciated drainable  abscess. There is peritendinitis or tenosynovitis of the distal tibialis posterior tendon. No thrombosis of regional superficial venous structures identified. No observed gas in the soft tissues. IMPRESSION: 1. There is subcutaneous edema overlying the plate and screw fixator but I do not observe a definite drainable abscess. Sensitivity for smaller abscesses is reduced due to streak artifact. No gas in the soft tissues. 2. The plate lips out posteriorly 6 mm behind the medial malleolus. The tibialis posterior tendon is in the resulting groove. The plate hooks in laterally at its inferior margin and is probably impinging on the tibialis posterior tendon. The tendon appears thickened and increased in diameter distal to this location. 3. Partial union of the oblique distal tibial fracture traversed by the plate. Old healed fibular fractures. Electronically Signed   By: Van Clines M.D.   On: 09/16/2018 08:41    Review of Systems  Constitutional: Negative for weight loss.  HENT: Negative for ear discharge, ear pain, hearing loss and tinnitus.   Eyes: Negative for blurred vision, double vision, photophobia and pain.  Respiratory: Negative for cough, sputum production and shortness of breath.   Cardiovascular: Negative for chest pain.  Gastrointestinal: Negative for abdominal pain, nausea and vomiting.  Genitourinary: Negative for dysuria, flank pain, frequency and urgency.  Musculoskeletal: Positive for joint pain (Right ankle). Negative for back pain, falls, myalgias and neck pain.  Neurological: Negative for dizziness, tingling, sensory change, focal weakness, loss of consciousness and headaches.  Endo/Heme/Allergies: Does not bruise/bleed easily.  Psychiatric/Behavioral: Negative for depression, memory loss and substance abuse. The patient is not nervous/anxious.    Blood pressure 111/79, pulse (!) 54, temperature 98.9 F (37.2 C), temperature source Oral, resp. rate 18, height _0  (1.803  m), weight 68 kg, SpO2 100 %. Physical Exam  Constitutional: He appears well-developed and well-nourished. No distress.  HENT:  Head: Normocephalic and atraumatic.  Eyes: Conjunctivae are normal. Right eye exhibits no discharge. Left eye exhibits no discharge. No scleral icterus.  Neck: Normal range of motion.  Cardiovascular: Normal rate and regular rhythm.  Respiratory: Effort normal. No respiratory distress.  Musculoskeletal:  RLE No traumatic wounds, ecchymosis, or rash  Ankle mod TTP, 1cm circular ulceration over medial ankle/lower leg, purulent discharge  No knee or ankle effusion  Knee stable to varus/ valgus and anterior/posterior stress  Sens DPN, SPN, TN intact  Motor EHL, ext, flex, evers 5/5  DP 2+, PT 1+, No significant edema  Neurological: He is alert.  Skin: Skin is warm and dry. He is not diaphoretic.  Psychiatric: He has a normal mood and affect. His behavior is normal.    Assessment/Plan: Right ankle infection -- Given the recurrent infections in this area I worry about an underlying osteomyelitis. Will order MRI to r/o. If negative then can discharge home with oral abx. If positive will need admission for treatment. If discharged he should f/u with Dr. Griffin Basil in a week or so.    Lisette Abu, PA-C Orthopedic Surgery [phone] 09/16/2018, 9:50 AM

## 2018-09-16 NOTE — ED Provider Notes (Signed)
[facility]-EMERGENCY DEPT Provider Note   CSN: [id number] Arrival date & time: 09/15/18  1641     History   Chief Complaint Chief Complaint  Patient presents with  . Leg Injury  . Leg Swelling    HPI Dustin Marquez is a 37 y.o. male.  Patient with history of previous right lower leg injury, surgically repaired by Dr. Everardo Pacific approximately one year ago involving placement of hardware, presents with complaint of chronic right lower leg pain. No fever. He has an open wound to the lower leg that is draining purulent material that he noticed tonight. Ulcerations and open wounds have been a recurrent issue since the initial surgery, per patient.   The history is provided by the patient. No language interpreter was used.    Past Medical History:  Diagnosis Date  . Bipolar 2 disorder ([facility])   . Depression   . Hypertension   . Jaw dislocation   . Opiate dependence ([facility]) 11/28/2017    Patient Active Problem List   Diagnosis Date Noted  . Bipolar 2 disorder ([facility]) 07/02/2018  . Suicidal ideation 07/02/2018  . 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 Opiate dependence ([facility]) 11/28/2017  . Encounter for care related to feeding tube   . H/O cervical fracture   . History of pneumothorax   . Tibia/fibula fracture   . 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 Cytotoxic brain edema ([facility]) 09/11/2017  . Left hemiplegia ([facility]) 09/11/2017  . Left homonymous hemianopsia 09/11/2017  . Closed right pilon fracture, initial encounter 09/09/2017  . C6 cervical fracture ([facility]) 09/06/2017  . Bipolar affective disorder, depressed, severe ([facility]) 05/05/2013    Class: Acute  . 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 EXTERNAL FIXATION LEG Right 09/09/2017   Procedure: EXTERNAL FIXATION RIGHT LOWER LEG;  Surgeon: Bjorn Pippin, MD;  Location: [facility];  Service: Orthopedics;  Laterality: Right;  . FRACTURE SURGERY    . I&D EXTREMITY Right 10/02/2017   Procedure: IRRIGATION AND DEBRIDEMENT EXTREMITY;  Surgeon: Bjorn Pippin, MD;  Location: [facility];  Service: Orthopedics;  Laterality: Right;  . IR ANGIO EXTERNAL CAROTID SEL EXT CAROTID UNI L MOD SED  09/06/2017  . IR ANGIO INTRA EXTRACRAN SEL COM CAROTID INNOMINATE BILAT MOD SED  09/06/2017  . IR ANGIO VERTEBRAL SEL SUBCLAVIAN INNOMINATE BILAT MOD SED  09/06/2017  . ORIF TIBIA FRACTURE Right 09/19/2017   Procedure: OPEN REDUCTION INTERNAL FIXATION (ORIF) TIBIA FRACTURE;  Surgeon: Bjorn Pippin, MD;  Location: [facility];  Service: Orthopedics;  Laterality: Right;        Home Medications    Prior to Admission medications   Medication Sig Start Date End Date Taking? Authorizing Provider  gabapentin (NEURONTIN) 300 MG capsule Take 300 mg by mouth 2 (two) times daily. 09/01/18  Yes [provider]  clopidogrel (PLAVIX) 75 MG tablet Take 1 tablet (75 mg total) by mouth daily. For clot prevention Patient not taking: Reported on 09/16/2018 07/04/18   Armandina Stammer I, NP  hydrOXYzine (ATARAX/VISTARIL) 50 MG tablet Take 1 tablet (50 mg total) by mouth every 6 (six) hours as needed for anxiety. Patient not taking: Reported on 09/16/2018 07/04/18   Armandina Stammer I, NP  ketorolac (TORADOL) 10 MG  tablet Take 1 tablet (10 mg total) by mouth every 6 (six) hours as needed. Patient not taking: Reported on 09/16/2018 07/04/18   Liberty HandyGibbons, Claudia J, PA-C  naltrexone (DEPADE) 50 MG tablet Take 2 tablets (100 mg total) by mouth daily. For alcoholism Patient not taking: Reported on 09/16/2018 07/04/18   Armandina StammerNwoko, Agnes I, NP  nicotine (NICODERM CQ - DOSED IN MG/24 HOURS) 21 mg/24hr patch Place 1 patch (21 mg total) onto the skin daily. (May buy from over the  counter): For smoking cessation Patient not taking: Reported on 09/16/2018 07/05/18   Armandina StammerNwoko, Agnes I, NP    Family History No family history on file.  Social History Social History   Tobacco Use  . Smoking status: Current Every Day Smoker    Packs/day: 0.50    Types: Cigarettes  . Smokeless tobacco: Never Used  Substance Use Topics  . Alcohol use: Yes  . Drug use: Yes    Frequency: 2.0 times per week    Types: Cocaine     Allergies   Chlorhexidine and Lamictal [lamotrigine]   Review of Systems Review of Systems  Constitutional: Negative for chills and fever.  Respiratory: Negative.   Cardiovascular: Negative.   Gastrointestinal: Negative.  Negative for nausea.  Musculoskeletal: Negative.        See HPI.  Skin: Positive for wound.  Neurological: Negative.  Negative for weakness.     Physical Exam Updated Vital Signs BP 115/82 (BP Location: Left Arm)   Pulse 81   Temp 98.9 F (37.2 C) (Oral)   Resp 16   Ht 5\' 11"  (1.803 m)   Wt 68 kg   SpO2 100%   BMI 20.92 kg/m   Physical Exam  Constitutional: He is oriented to person, place, and time. He appears well-developed and well-nourished.  Neck: Normal range of motion.  Cardiovascular: Intact distal pulses.  Pulmonary/Chest: Effort normal.  Musculoskeletal: Normal range of motion.  Right lower leg has multiple well healed surgical scars. There is moderate edema of dorsal foot without redness, warmth or tenderness. There is hyperpigmentation of the medial lower extremity that appears chronic. There is a shallow ulceration distal medial lower leg that has purulent drainage. There is minimal erythema.   Neurological: He is alert and oriented to person, place, and time.  Skin: Skin is warm and dry.  Psychiatric: He has a normal mood and affect.     ED Treatments / Results  Labs (all labs ordered are listed, but only abnormal results are displayed) Labs Reviewed  COMPREHENSIVE METABOLIC PANEL - Abnormal; Notable  for the following components:      Result Value   Total Protein 8.4 (*)    Total Bilirubin 1.3 (*)    All other components within normal limits  CBC WITH DIFFERENTIAL/PLATELET  URINALYSIS, ROUTINE W REFLEX MICROSCOPIC  RAPID URINE DRUG SCREEN, HOSP PERFORMED  I-STAT CG4 LACTIC ACID, ED  I-STAT CG4 LACTIC ACID, ED    EKG None  Radiology No results found.  Procedures Procedures (including critical care time)  Medications Ordered in ED Medications  ceFEPIme (MAXIPIME) 2 g in sodium chloride 0.9 % 100 mL IVPB (has no administration in time range)  ketorolac (TORADOL) 30 MG/ML injection 15 mg (has no administration in time range)     Initial Impression / Assessment and Plan / ED Course  I have reviewed the triage vital signs and the nursing notes.  Pertinent labs & imaging results that were available during my care of the patient were reviewed  by me and considered in my medical decision making (see chart for details).     Patient is here for management of chronic right ankle pain and recurrent wound that is now draining. No fever.  Chart reviewed. His initial injury in 11/2017 included significant other injuries, as well as post-op infection of his right ankle, requiring extended rehab stay where he received IV antibiotics for pseudomonas positive wound cultures at that time. He was eventually discharged home to continue IV abx therapy with cefepime, per infectious disease (Dr. Algis Liming) with the plan to remove the hardware in the ankle before antibiotic therapy would conclude. The patient reports the antibiotics were stopped by Dr. Algis Liming. Notes show that the patient was discharged home to his aunt's house who eventually kicked him out (early January). On 01/24/18 he came to the ED to have the PICC line removed which was done. He reports having seen Dr. Everardo Pacific in the office x 1 since discharge.   DDx today: osteomyelitis vs infection involving hardware-recurrent pseudomonas vs MRSA.  Cultures pending here.  The patient is nontoxic in appearance. No fever, vomiting. There is no significant erythema to ankle but there is pain with movement of the ankle and he is tender from the draining wound down to ankle medially. No lateral tenderness. IV cefepime has been given, Vanc added for additional converage.   Plan: CT w/CM to decipher soft tissue infection vs osteo - MR not currently available. Likely will need admission for IV antibiotics and assessment by orthopedics.   Patient signed out to [facility], PA-C, pending CT result and appropriate consultations.    Final Clinical Impressions(s) / ED Diagnoses   Final diagnoses:  None   1. Right lower extremity infection 2. Chronic right ankle pain  ED Discharge Orders    None       Elpidio Anis, PA-C 09/17/18 1610    Glynn Octave, MD 09/17/18 [phone]

## 2018-09-16 NOTE — ED Notes (Signed)
Pt remains in MRI 

## 2018-09-17 ENCOUNTER — Encounter (HOSPITAL_COMMUNITY): Payer: Self-pay | Admitting: *Deleted

## 2018-09-17 DIAGNOSIS — Z9889 Other specified postprocedural states: Secondary | ICD-10-CM | POA: Diagnosis not present

## 2018-09-17 DIAGNOSIS — T84622D Infection and inflammatory reaction due to internal fixation device of right tibia, subsequent encounter: Secondary | ICD-10-CM | POA: Diagnosis not present

## 2018-09-17 DIAGNOSIS — M86661 Other chronic osteomyelitis, right tibia and fibula: Secondary | ICD-10-CM | POA: Diagnosis not present

## 2018-09-17 DIAGNOSIS — S82401K Unspecified fracture of shaft of right fibula, subsequent encounter for closed fracture with nonunion: Secondary | ICD-10-CM

## 2018-09-17 DIAGNOSIS — M86461 Chronic osteomyelitis with draining sinus, right tibia and fibula: Secondary | ICD-10-CM | POA: Diagnosis not present

## 2018-09-17 DIAGNOSIS — F1910 Other psychoactive substance abuse, uncomplicated: Secondary | ICD-10-CM | POA: Diagnosis not present

## 2018-09-17 DIAGNOSIS — S82201K Unspecified fracture of shaft of right tibia, subsequent encounter for closed fracture with nonunion: Secondary | ICD-10-CM

## 2018-09-17 DIAGNOSIS — T847XXS Infection and inflammatory reaction due to other internal orthopedic prosthetic devices, implants and grafts, sequela: Secondary | ICD-10-CM

## 2018-09-17 LAB — SURGICAL PCR SCREEN
MRSA, PCR: NEGATIVE
Staphylococcus aureus: NEGATIVE

## 2018-09-17 LAB — HIV ANTIBODY (ROUTINE TESTING W REFLEX): HIV SCREEN 4TH GENERATION: NONREACTIVE

## 2018-09-17 MED ORDER — OXYCODONE-ACETAMINOPHEN 5-325 MG PO TABS
1.0000 | ORAL_TABLET | Freq: Four times a day (QID) | ORAL | Status: DC | PRN
  Administered 2018-09-17 – 2018-09-18 (×5): 2 via ORAL
  Filled 2018-09-17 (×5): qty 2

## 2018-09-17 MED ORDER — OXYCODONE-ACETAMINOPHEN 5-325 MG PO TABS
2.0000 | ORAL_TABLET | Freq: Once | ORAL | Status: AC
  Administered 2018-09-17: 2 via ORAL
  Filled 2018-09-17: qty 2

## 2018-09-17 NOTE — Plan of Care (Signed)

## 2018-09-17 NOTE — Progress Notes (Signed)
[facility]  [facility] Infusion Coordinator will follow pt with ID team to support [facility] services for home IV ABX if needed.  If patient discharges after hours, please call [phone].   Sedalia Muta 09/17/2018, 4:44 PM

## 2018-09-17 NOTE — Consult Note (Signed)
  ORTHOPAEDIC CONSULTATION  REQUESTING PHYSICIAN: Danford, Christopher P, *  Chief Complaint: Purulent draining ulcer medial right tibia.  HPI: Dustin Marquez is a 37 y.o. male who presents with chronic osteomyelitis and a tibial nonunion status post open reduction internal fixation for distal tibia fracture.  Past Medical History:  Diagnosis Date  . Bipolar 2 disorder (HCC)   . Depression   . Hypertension   . Jaw dislocation   . Opiate dependence (HCC) 11/28/2017   Past Surgical History:  Procedure Laterality Date  . EXTERNAL FIXATION LEG Right 09/09/2017   Procedure: EXTERNAL FIXATION RIGHT LOWER LEG;  Surgeon: Varkey, Dax T, MD;  Location: [facility] OR;  Service: Orthopedics;  Laterality: Right;  . FRACTURE SURGERY    . I&D EXTREMITY Right 10/02/2017   Procedure: IRRIGATION AND DEBRIDEMENT EXTREMITY;  Surgeon: Varkey, Dax T, MD;  Location: [facility] OR;  Service: Orthopedics;  Laterality: Right;  . IR ANGIO EXTERNAL CAROTID SEL EXT CAROTID UNI L MOD SED  09/06/2017  . IR ANGIO INTRA EXTRACRAN SEL COM CAROTID INNOMINATE BILAT MOD SED  09/06/2017  . IR ANGIO VERTEBRAL SEL SUBCLAVIAN INNOMINATE BILAT MOD SED  09/06/2017  . ORIF TIBIA FRACTURE Right 09/19/2017   Procedure: OPEN REDUCTION INTERNAL FIXATION (ORIF) TIBIA FRACTURE;  Surgeon: Varkey, Dax T, MD;  Location: [facility] OR;  Service: Orthopedics;  Laterality: Right;   Social History   Socioeconomic History  . Marital status: Single    Spouse name: Not on file  . Number of children: Not on file  . Years of education: Not on file  . Highest education level: Not on file  Occupational History  . Not on file  Social Needs  . Financial resource strain: Not on file  . Food insecurity:    Worry: Not on file    Inability: Not on file  . Transportation needs:    Medical: Not on file    Non-medical: Not on file  Tobacco Use  . Smoking status: Current Every Day Smoker    Packs/day: 0.50    Types: Cigarettes  . Smokeless tobacco: Never  Used  Substance and Sexual Activity  . Alcohol use: Yes  . Drug use: Yes    Frequency: 2.0 times per week    Types: Cocaine  . Sexual activity: Not Currently    Birth control/protection: None  Lifestyle  . Physical activity:    Days per week: Not on file    Minutes per session: Not on file  . Stress: Not on file  Relationships  . Social connections:    Talks on phone: Not on file    Gets together: Not on file    Attends religious service: Not on file    Active member of club or organization: Not on file    Attends meetings of clubs or organizations: Not on file    Relationship status: Not on file  Other Topics Concern  . Not on file  Social History Narrative   ** Merged History Encounter **       History reviewed. No pertinent family history. - negative except otherwise stated in the family history section Allergies  Allergen Reactions  . Chlorhexidine Rash    CHG wipes in ICU caused rash that persisted until they were discontinued.  . Lamictal [Lamotrigine] Rash   Prior to Admission medications   Medication Sig Start Date End Date Taking? Authorizing Provider  gabapentin (NEURONTIN) 300 MG capsule Take 300 mg by mouth 2 (two) times daily. 09/01/18  Yes   [provider]  clopidogrel (PLAVIX) 75 MG tablet Take 1 tablet (75 mg total) by mouth daily. For clot prevention Patient not taking: Reported on 09/16/2018 07/04/18   Nwoko, Agnes I, NP  hydrOXYzine (ATARAX/VISTARIL) 50 MG tablet Take 1 tablet (50 mg total) by mouth every 6 (six) hours as needed for anxiety. Patient not taking: Reported on 09/16/2018 07/04/18   Nwoko, Agnes I, NP  ketorolac (TORADOL) 10 MG tablet Take 1 tablet (10 mg total) by mouth every 6 (six) hours as needed. Patient not taking: Reported on 09/16/2018 07/04/18   Gibbons, Claudia J, PA-C  naltrexone (DEPADE) 50 MG tablet Take 2 tablets (100 mg total) by mouth daily. For alcoholism Patient not taking: Reported on 09/16/2018 07/04/18   Nwoko, Agnes I, NP   nicotine (NICODERM CQ - DOSED IN MG/24 HOURS) 21 mg/24hr patch Place 1 patch (21 mg total) onto the skin daily. (May buy from over the counter): For smoking cessation Patient not taking: Reported on 09/16/2018 07/05/18   Nwoko, Agnes I, NP   Dg Tibia/fibula Right  Result Date: 09/16/2018 CLINICAL DATA:  37-year-old male with recent trauma and surgery on the right lower extremity presenting with increased pain and drainage. EXAM: RIGHT TIBIA AND FIBULA - 2 VIEW COMPARISON:  Right lower extremity radiograph dated 06/19/2018 FINDINGS: There is no acute fracture or dislocation. Old healed proximal fibular diaphysis fracture. Distal tibial fixation sideplate and screws noted. The orthopedic hardware appears intact. There is approximately 2 mm gap between the distal aspect of the sideplate and bone cortex similar to the prior radiograph. Interval progression of healing of the fracture with bridging bone formation. There is osteopenia of the ankle and distal leg secondary to disuse. The ankle mortise remains intact. There is increased soft tissue swelling over the medial ankle and sideplate. Clinical correlation is recommended to evaluate for possibility of an infectious process. IMPRESSION: 1. Soft tissue swelling over the medial ankle and sideplate with infiltration of the subcutaneous fat, new since the prior radiograph. Clinical correlation is recommended to evaluate for an infectious process. 2. Intact appearance of the fixation hardware. A 2 mm gap between the sideplate and bone cortex is similar to prior radiograph. 3. No acute fracture or dislocation. Interval progression of fracture healing. Electronically Signed   By: Arash  Radparvar M.D.   On: 09/16/2018 03:07   Ct Tibia Fibula Right W Contrast  Result Date: 09/16/2018 CLINICAL DATA:  Nonhealing wound. Prior operative reduction and internal fixation on 09/19/2017 EXAM: CT OF THE LOWER RIGHT EXTREMITY WITH CONTRAST TECHNIQUE: Multidetector CT imaging of  the lower right extremity was performed according to the standard protocol following intravenous contrast administration. COMPARISON:  09/16/2018 CONTRAST:  100mL OMNIPAQUE IOHEXOL 300 MG/ML  SOLN FINDINGS: Bones/Joint/Cartilage Old healed fibular fractures including a proximal diaphyseal fracture with some residual deformity, as well as some deformity of the distal tip of the fibula with associated spurring. Medial tibial plate and screw fixator without any abnormal lucency along the screw fixators, an without visible hardware failure. This traverses a partially healed oblique fracture of the distal tibial metadiaphysis. Two of the distal-most screws extend across the distal tibiofibular articulation and into the distal fibula metaphysis. Ligaments Suboptimally assessed by CT. Muscles and Tendons The distal tibial plate lips out over the posterior margin of the medial malleolus by 6 mm as shown on image 280/5. The tibialis posterior tendon extends in the resulting groove between the medial malleolus and the plate. Plate does hook in laterally at its bottom   and could be impinging on the distal tibialis posterior tendon, which does appear abnormally thickened distal to the plate. Soft tissues Streak artifact from the plate and screw fixator somewhat limits the assessment of the soft tissues. There is clearly subcutaneous edema and swelling overlying the plate and screw fixator especially inferiorly. I do not appreciated drainable abscess. There is peritendinitis or tenosynovitis of the distal tibialis posterior tendon. No thrombosis of regional superficial venous structures identified. No observed gas in the soft tissues. IMPRESSION: 1. There is subcutaneous edema overlying the plate and screw fixator but I do not observe a definite drainable abscess. Sensitivity for smaller abscesses is reduced due to streak artifact. No gas in the soft tissues. 2. The plate lips out posteriorly 6 mm behind the medial malleolus. The  tibialis posterior tendon is in the resulting groove. The plate hooks in laterally at its inferior margin and is probably impinging on the tibialis posterior tendon. The tendon appears thickened and increased in diameter distal to this location. 3. Partial union of the oblique distal tibial fracture traversed by the plate. Old healed fibular fractures. Electronically Signed   By: Walter  Liebkemann M.D.   On: 09/16/2018 08:41   Dustin Ankle Right W Wo Contrast  Result Date: 09/16/2018 CLINICAL DATA:  Right ankle pain and swelling.  Soft tissue wound. EXAM: MRI OF THE RIGHT ANKLE WITHOUT AND WITH CONTRAST TECHNIQUE: Multiplanar, multisequence Dustin imaging of the ankle was performed before and after the administration of intravenous contrast. CONTRAST:  7.5 cc Gadavist COMPARISON:  CT scan dated 09/16/2018, radiographs dated 05/23/2018 and CT scan dated 09/07/2017 FINDINGS: TENDONS Peroneal: Normal. Posteromedial: Normal. Anterior: Normal. Achilles: Normal. Plantar Fascia: Normal. LIGAMENTS Lateral: Intact. Medial: Intact. CARTILAGE Ankle Joint: Slight joint space narrowing. Tiny marginal osteophytes on the anterior and posterior aspects of the distal tibia. No joint effusion. Subtalar Joints/Sinus Tarsi: Normal. Bones: There is incomplete healing of the oblique fracture of the distal tibial shaft. Prior CT scan demonstrates extensive solid areas of bone union but there is an area of irregular abnormal increased signal intensity on T2 weighted imaging with what appear to be 2 small bony sequestra. This most likely represents chronic osteomyelitis. There is no loosening of the hardware in the distal tibia. No definable soft tissue ulceration or subcutaneous abscess. There is skin thickening overlying the medial aspect of the distal tibia. Other: None IMPRESSION: IMPRESSION 1. Findings consistent with chronic osteomyelitis involving the distal tibial shaft at the site of prior fracture with 2 small bony sequestrations. 2.  No discrete soft tissue abscess. Electronically Signed   By: James  Maxwell M.D.   On: 09/16/2018 12:00   - pertinent xrays, CT, MRI studies were reviewed and independently interpreted  Positive ROS: All other systems have been reviewed and were otherwise negative with the exception of those mentioned in the HPI and as above.  Physical Exam: General: Alert, no acute distress Psychiatric: Patient is competent for consent with normal mood and affect Lymphatic: No axillary or cervical lymphadenopathy Cardiovascular: No pedal edema Respiratory: No cyanosis, no use of accessory musculature GI: No organomegaly, abdomen is soft and non-tender    Images:  @ENCIMAGES@   Labs:  Lab Results  Component Value Date   HGBA1C 5.1 09/11/2017   ESRSEDRATE 1 06/19/2018   ESRSEDRATE 0 01/24/2018   ESRSEDRATE 3 12/10/2017   CRP 0.9 06/19/2018   CRP <0.8 01/24/2018   CRP <0.8 12/10/2017   REPTSTATUS PENDING 09/16/2018   GRAMSTAIN  09/16/2018    RARE   WBC PRESENT, PREDOMINANTLY PMN NO ORGANISMS SEEN Performed at [facility], [address] [place], [place] [address]    CULT FEW STAPHYLOCOCCUS AUREUS 09/16/2018   LABORGA PSEUDOMONAS AERUGINOSA 10/02/2017    Lab Results  Component Value Date   ALBUMIN 4.5 09/15/2018   ALBUMIN 4.2 07/01/2018   ALBUMIN 4.0 06/19/2018    Neurologic: Patient does not have protective sensation bilateral lower extremities.   MUSCULOSKELETAL:   Skin: Examination patient has a small wound with purulent drainage medially over the tibial hardware.  Patient's leg is thin and atrophic.  Patient has a good dorsalis pedis pulse.  Review of the MRI scan shows a nonunion with osteomyelitis of the tibial fracture.  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  Plan: Plan: Discussed with the patient we could proceed with limb salvage which would be removal of the tibial plate debridement of the nonunion and osteomyelitis  placement of antibiotic beads.  Discussed the risks and benefits of surgery including the potential risk of amputation.  Patient states he understands wished to proceed at this time.  Thank you for the consult and the opportunity to see Dustin. Marquez  Marcus Duda, MD [facility] [phone] 9:36 AM     

## 2018-09-17 NOTE — Progress Notes (Signed)
INFECTIOUS DISEASE PROGRESS NOTE  ID: Dustin Marquez is a 37 y.o. male with  Principal Problem:   Osteomyelitis of right tibia [facility]) Active Problems:   Tibia/fibula fracture   Hardware complicating wound infection (HCC)   Status post multiple system trauma surgery   Polysubstance abuse (HCC)  Subjective: No complaints, still some d/c from wound os on RLE.   Abtx:  Anti-infectives (From admission, onward)   Start     Dose/Rate Route Frequency Ordered Stop   09/16/18 0700  vancomycin (VANCOCIN) IVPB 1000 mg/200 mL premix     1,000 mg 200 mL/hr over 60 Minutes Intravenous  Once 09/16/18 0653 09/16/18 0809   09/16/18 0215  ceFEPIme (MAXIPIME) 2 g in sodium chloride 0.9 % 100 mL IVPB     2 g 200 mL/hr over 30 Minutes Intravenous  Once 09/16/18 0205 09/16/18 0314      Medications:  Scheduled: . clopidogrel  75 mg Oral Daily  . enoxaparin (LOVENOX) injection  40 mg Subcutaneous Q24H  . gabapentin  300 mg Oral BID  . mupirocin ointment  1 application Nasal BID  . nicotine  21 mg Transdermal Daily    Objective: Vital signs in last 24 hours: Temp:  [98 F (36.7 C)-98.6 F (37 C)] 98 F (36.7 C) (09/25 0448) Pulse Rate:  [53-75] 60 (09/25 0448) Resp:  [15-18] 15 (09/25 0448) BP: (97-114)/(62-78) 97/76 (09/25 0448) SpO2:  [98 %-100 %] 100 % (09/25 0448)   General appearance: alert, cooperative and no distress Incision/Wound: no d/c from his RLE lower wound. Non-tender.   Lab Results Recent Labs    09/15/18 2002  WBC 9.1  HGB 15.4  HCT 46.5  NA 142  K 4.2  CL 105  CO2 25  BUN 14  CREATININE 0.92   Liver Panel Recent Labs    09/15/18 2002  PROT 8.4*  ALBUMIN 4.5  AST 32  ALT 37  ALKPHOS 38  BILITOT 1.3*   Sedimentation Rate No results for input(s): ESRSEDRATE in the last 72 hours. C-Reactive Protein No results for input(s): CRP in the last 72 hours.  Microbiology: Recent Results (from the past 240 hour(s))  Wound or Superficial Culture      Status: None (Preliminary result)   Collection Time: 09/16/18  4:42 AM  Result Value Ref Range Status   Specimen Description   Final    WOUND RIGHT LEG Performed at [facility], [address], [place], [place] [address]    Special Requests   Final    Normal Performed at [facility], [address] [place] [address], [place], [place] [address]    Gram Stain   Final    RARE WBC PRESENT, PREDOMINANTLY PMN NO ORGANISMS SEEN Performed at [facility], [address], [place], [place] [address]    Culture FEW STAPHYLOCOCCUS AUREUS  Final   Report Status PENDING  Incomplete  Surgical PCR screen     Status: None   Collection Time: 09/16/18 11:01 PM  Result Value Ref Range Status   MRSA, PCR NEGATIVE NEGATIVE Final   Staphylococcus aureus NEGATIVE NEGATIVE Final    Comment: (NOTE) The Xpert SA Assay (FDA approved for NASAL specimens in patients 48 years of age and older), is one component of a comprehensive surveillance program. It is not intended to diagnose infection nor to guide or monitor treatment. Performed at [facility], [address], [place], [place] [address]     Studies/Results: Dg Tibia/fibula Right  Result Date: 09/16/2018 CLINICAL DATA:  37 year old male with recent trauma and surgery on the right lower extremity presenting with increased pain and drainage. EXAM: RIGHT TIBIA AND FIBULA - 2 VIEW COMPARISON:  Right lower extremity radiograph dated 06/19/2018 FINDINGS: There is no acute fracture or dislocation. Old healed proximal fibular diaphysis fracture. Distal tibial fixation sideplate and screws noted. The orthopedic hardware appears intact. There is approximately 2 mm gap between the distal aspect of the sideplate and bone cortex similar to the prior radiograph. Interval progression of healing of the fracture with bridging bone formation. There is osteopenia of the ankle and distal leg secondary to disuse. The ankle mortise remains  intact. There is increased soft tissue swelling over the medial ankle and sideplate. Clinical correlation is recommended to evaluate for possibility of an infectious process. IMPRESSION: 1. Soft tissue swelling over the medial ankle and sideplate with infiltration of the subcutaneous fat, new since the prior radiograph. Clinical correlation is recommended to evaluate for an infectious process. 2. Intact appearance of the fixation hardware. A 2 mm gap between the sideplate and bone cortex is similar to prior radiograph. 3. No acute fracture or dislocation. Interval progression of fracture healing. Electronically Signed   By: Elgie Collard M.D.   On: 09/16/2018 03:07   Ct Tibia Fibula Right W Contrast  Result Date: 09/16/2018 CLINICAL DATA:  Nonhealing wound. Prior operative reduction and internal fixation on 09/19/2017 EXAM: CT OF THE LOWER RIGHT EXTREMITY WITH CONTRAST TECHNIQUE: Multidetector CT imaging of the lower right extremity was performed according to the standard protocol following intravenous contrast administration. COMPARISON:  09/16/2018 CONTRAST:  OMNIPAQUE IOHEXOL 300 MG/ML  SOLN FINDINGS: Bones/Joint/Cartilage Old healed fibular fractures including a proximal diaphyseal fracture with some residual deformity, as well as some deformity of the distal tip of the fibula with associated spurring. Medial tibial plate and screw fixator without any abnormal lucency along the screw fixators, an without visible hardware failure. This traverses a partially healed oblique fracture of the distal tibial metadiaphysis. Two of the distal-most screws extend across the distal tibiofibular articulation and into the distal fibula metaphysis. Ligaments Suboptimally assessed by CT. Muscles and Tendons The distal tibial plate lips out over the posterior margin of the medial malleolus by 6 mm as shown on image 280/5. The tibialis posterior tendon extends in the resulting groove between the medial malleolus and  the plate. Plate does hook in laterally at its bottom and could be impinging on the distal tibialis posterior tendon, which does appear abnormally thickened distal to the plate. Soft tissues Streak artifact from the plate and screw fixator somewhat limits the assessment of the soft tissues. There is clearly subcutaneous edema and swelling overlying the plate and screw fixator especially inferiorly. I do not appreciated drainable abscess. There is peritendinitis or tenosynovitis of the distal tibialis posterior tendon. No thrombosis of regional superficial venous structures identified. No observed gas in the soft tissues. IMPRESSION: 1. There is subcutaneous edema overlying the plate and screw fixator but I do not observe a definite drainable abscess. Sensitivity for smaller abscesses is reduced due to streak artifact. No gas in the soft tissues. 2. The plate lips out posteriorly 6 mm behind the medial malleolus. The tibialis posterior tendon is in the resulting groove. The plate hooks in laterally at its inferior margin and is probably impinging on the tibialis posterior tendon. The tendon appears thickened and increased in diameter distal to this location. 3. Partial union of the oblique distal tibial fracture traversed  by the plate. Old healed fibular fractures. Electronically Signed   By: Gaylyn Rong M.D.   On: 09/16/2018 08:41   Mr Ankle Right W Wo Contrast  Result Date: 09/16/2018 CLINICAL DATA:  Right ankle pain and swelling.  Soft tissue wound. EXAM: MRI OF THE RIGHT ANKLE WITHOUT AND WITH CONTRAST TECHNIQUE: Multiplanar, multisequence MR imaging of the ankle was performed before and after the administration of intravenous contrast. CONTRAST:  7.5 cc Gadavist COMPARISON:  CT scan dated 09/16/2018, radiographs dated 05/23/2018 and CT scan dated 09/07/2017 FINDINGS: TENDONS Peroneal: Normal. Posteromedial: Normal. Anterior: Normal. Achilles: Normal. Plantar Fascia: Normal. LIGAMENTS Lateral: Intact.  Medial: Intact. CARTILAGE Ankle Joint: Slight joint space narrowing. Tiny marginal osteophytes on the anterior and posterior aspects of the distal tibia. No joint effusion. Subtalar Joints/Sinus Tarsi: Normal. Bones: There is incomplete healing of the oblique fracture of the distal tibial shaft. Prior CT scan demonstrates extensive solid areas of bone union but there is an area of irregular abnormal increased signal intensity on T2 weighted imaging with what appear to be 2 small bony sequestra. This most likely represents chronic osteomyelitis. There is no loosening of the hardware in the distal tibia. No definable soft tissue ulceration or subcutaneous abscess. There is skin thickening overlying the medial aspect of the distal tibia. Other: None IMPRESSION: IMPRESSION 1. Findings consistent with chronic osteomyelitis involving the distal tibial shaft at the site of prior fracture with 2 small bony sequestrations. 2. No discrete soft tissue abscess. Electronically Signed   By: Francene Boyers M.D.   On: 09/16/2018 12:00     Assessment/Plan: Chronic Osteo R tibia Hardware post traumatic fracture Superficial Cx Staph aureus  Total days of antibiotics: none  Await deep Cx done at time of his ortho procedure.  Continue to hold anbx.  Repeat HIV testing.  Will continue to follow.          Johny Sax MD, FACP Infectious Diseases (pager) [phone] [email or web address] 09/17/2018, 10:23 AM  LOS: 0 days

## 2018-09-17 NOTE — H&P (View-Only) (Signed)
ORTHOPAEDIC CONSULTATION  REQUESTING PHYSICIAN: Alberteen SamDanford, Christopher P, *  Chief Complaint: Purulent draining ulcer medial right tibia.  HPI: Dustin Marquez is a 37 y.o. male who presents with chronic osteomyelitis and a tibial nonunion status post open reduction internal fixation for distal tibia fracture.  Past Medical History:  Diagnosis Date  . Bipolar 2 disorder (HCC)   . Depression   . Hypertension   . Jaw dislocation   . Opiate dependence (HCC) 11/28/2017   Past Surgical History:  Procedure Laterality Date  . EXTERNAL FIXATION LEG Right 09/09/2017   Procedure: EXTERNAL FIXATION RIGHT LOWER LEG;  Surgeon: Bjorn PippinVarkey, Dax T, MD;  Location: [facility] OR;  Service: Orthopedics;  Laterality: Right;  . FRACTURE SURGERY    . I&D EXTREMITY Right 10/02/2017   Procedure: IRRIGATION AND DEBRIDEMENT EXTREMITY;  Surgeon: Bjorn PippinVarkey, Dax T, MD;  Location: [facility] OR;  Service: Orthopedics;  Laterality: Right;  . IR ANGIO EXTERNAL CAROTID SEL EXT CAROTID UNI L MOD SED  09/06/2017  . IR ANGIO INTRA EXTRACRAN SEL COM CAROTID INNOMINATE BILAT MOD SED  09/06/2017  . IR ANGIO VERTEBRAL SEL SUBCLAVIAN INNOMINATE BILAT MOD SED  09/06/2017  . ORIF TIBIA FRACTURE Right 09/19/2017   Procedure: OPEN REDUCTION INTERNAL FIXATION (ORIF) TIBIA FRACTURE;  Surgeon: Bjorn PippinVarkey, Dax T, MD;  Location: [facility] OR;  Service: Orthopedics;  Laterality: Right;   Social History   Socioeconomic History  . Marital status: Single    Spouse name: Not on file  . Number of children: Not on file  . Years of education: Not on file  . Highest education level: Not on file  Occupational History  . Not on file  Social Needs  . Financial resource strain: Not on file  . Food insecurity:    Worry: Not on file    Inability: Not on file  . Transportation needs:    Medical: Not on file    Non-medical: Not on file  Tobacco Use  . Smoking status: Current Every Day Smoker    Packs/day: 0.50    Types: Cigarettes  . Smokeless tobacco: Never  Used  Substance and Sexual Activity  . Alcohol use: Yes  . Drug use: Yes    Frequency: 2.0 times per week    Types: Cocaine  . Sexual activity: Not Currently    Birth control/protection: None  Lifestyle  . Physical activity:    Days per week: Not on file    Minutes per session: Not on file  . Stress: Not on file  Relationships  . Social connections:    Talks on phone: Not on file    Gets together: Not on file    Attends religious service: Not on file    Active member of club or organization: Not on file    Attends meetings of clubs or organizations: Not on file    Relationship status: Not on file  Other Topics Concern  . Not on file  Social History Narrative   ** Merged History Encounter **       History reviewed. No pertinent family history. - negative except otherwise stated in the family history section Allergies  Allergen Reactions  . Chlorhexidine Rash    CHG wipes in ICU caused rash that persisted until they were discontinued.  . Lamictal [Lamotrigine] Rash   Prior to Admission medications   Medication Sig Start Date End Date Taking? Authorizing Provider  gabapentin (NEURONTIN) 300 MG capsule Take 300 mg by mouth 2 (two) times daily. 09/01/18  Yes  [provider]  clopidogrel (PLAVIX) 75 MG tablet Take 1 tablet (75 mg total) by mouth daily. For clot prevention Patient not taking: Reported on 09/16/2018 07/04/18   Armandina Stammer I, NP  hydrOXYzine (ATARAX/VISTARIL) 50 MG tablet Take 1 tablet (50 mg total) by mouth every 6 (six) hours as needed for anxiety. Patient not taking: Reported on 09/16/2018 07/04/18   Armandina Stammer I, NP  ketorolac (TORADOL) 10 MG tablet Take 1 tablet (10 mg total) by mouth every 6 (six) hours as needed. Patient not taking: Reported on 09/16/2018 07/04/18   Liberty Handy, PA-C  naltrexone (DEPADE) 50 MG tablet Take 2 tablets (100 mg total) by mouth daily. For alcoholism Patient not taking: Reported on 09/16/2018 07/04/18   Armandina Stammer I, NP   nicotine (NICODERM CQ - DOSED IN MG/24 HOURS) 21 mg/24hr patch Place 1 patch (21 mg total) onto the skin daily. (May buy from over the counter): For smoking cessation Patient not taking: Reported on 09/16/2018 07/05/18   Sanjuana Kava, NP   Dg Tibia/fibula Right  Result Date: 09/16/2018 CLINICAL DATA:  37 year old male with recent trauma and surgery on the right lower extremity presenting with increased pain and drainage. EXAM: RIGHT TIBIA AND FIBULA - 2 VIEW COMPARISON:  Right lower extremity radiograph dated 06/19/2018 FINDINGS: There is no acute fracture or dislocation. Old healed proximal fibular diaphysis fracture. Distal tibial fixation sideplate and screws noted. The orthopedic hardware appears intact. There is approximately 2 mm gap between the distal aspect of the sideplate and bone cortex similar to the prior radiograph. Interval progression of healing of the fracture with bridging bone formation. There is osteopenia of the ankle and distal leg secondary to disuse. The ankle mortise remains intact. There is increased soft tissue swelling over the medial ankle and sideplate. Clinical correlation is recommended to evaluate for possibility of an infectious process. IMPRESSION: 1. Soft tissue swelling over the medial ankle and sideplate with infiltration of the subcutaneous fat, new since the prior radiograph. Clinical correlation is recommended to evaluate for an infectious process. 2. Intact appearance of the fixation hardware. A 2 mm gap between the sideplate and bone cortex is similar to prior radiograph. 3. No acute fracture or dislocation. Interval progression of fracture healing. Electronically Signed   By: Elgie Collard M.D.   On: 09/16/2018 03:07   Ct Tibia Fibula Right W Contrast  Result Date: 09/16/2018 CLINICAL DATA:  Nonhealing wound. Prior operative reduction and internal fixation on 09/19/2017 EXAM: CT OF THE LOWER RIGHT EXTREMITY WITH CONTRAST TECHNIQUE: Multidetector CT imaging of  the lower right extremity was performed according to the standard protocol following intravenous contrast administration. COMPARISON:  09/16/2018 CONTRAST:  OMNIPAQUE IOHEXOL 300 MG/ML  SOLN FINDINGS: Bones/Joint/Cartilage Old healed fibular fractures including a proximal diaphyseal fracture with some residual deformity, as well as some deformity of the distal tip of the fibula with associated spurring. Medial tibial plate and screw fixator without any abnormal lucency along the screw fixators, an without visible hardware failure. This traverses a partially healed oblique fracture of the distal tibial metadiaphysis. Two of the distal-most screws extend across the distal tibiofibular articulation and into the distal fibula metaphysis. Ligaments Suboptimally assessed by CT. Muscles and Tendons The distal tibial plate lips out over the posterior margin of the medial malleolus by 6 mm as shown on image 280/5. The tibialis posterior tendon extends in the resulting groove between the medial malleolus and the plate. Plate does hook in laterally at its bottom  and could be impinging on the distal tibialis posterior tendon, which does appear abnormally thickened distal to the plate. Soft tissues Streak artifact from the plate and screw fixator somewhat limits the assessment of the soft tissues. There is clearly subcutaneous edema and swelling overlying the plate and screw fixator especially inferiorly. I do not appreciated drainable abscess. There is peritendinitis or tenosynovitis of the distal tibialis posterior tendon. No thrombosis of regional superficial venous structures identified. No observed gas in the soft tissues. IMPRESSION: 1. There is subcutaneous edema overlying the plate and screw fixator but I do not observe a definite drainable abscess. Sensitivity for smaller abscesses is reduced due to streak artifact. No gas in the soft tissues. 2. The plate lips out posteriorly 6 mm behind the medial malleolus. The  tibialis posterior tendon is in the resulting groove. The plate hooks in laterally at its inferior margin and is probably impinging on the tibialis posterior tendon. The tendon appears thickened and increased in diameter distal to this location. 3. Partial union of the oblique distal tibial fracture traversed by the plate. Old healed fibular fractures. Electronically Signed   By: Gaylyn Rong M.D.   On: 09/16/2018 08:41   Mr Ankle Right W Wo Contrast  Result Date: 09/16/2018 CLINICAL DATA:  Right ankle pain and swelling.  Soft tissue wound. EXAM: MRI OF THE RIGHT ANKLE WITHOUT AND WITH CONTRAST TECHNIQUE: Multiplanar, multisequence MR imaging of the ankle was performed before and after the administration of intravenous contrast. CONTRAST:  7.5 cc Gadavist COMPARISON:  CT scan dated 09/16/2018, radiographs dated 05/23/2018 and CT scan dated 09/07/2017 FINDINGS: TENDONS Peroneal: Normal. Posteromedial: Normal. Anterior: Normal. Achilles: Normal. Plantar Fascia: Normal. LIGAMENTS Lateral: Intact. Medial: Intact. CARTILAGE Ankle Joint: Slight joint space narrowing. Tiny marginal osteophytes on the anterior and posterior aspects of the distal tibia. No joint effusion. Subtalar Joints/Sinus Tarsi: Normal. Bones: There is incomplete healing of the oblique fracture of the distal tibial shaft. Prior CT scan demonstrates extensive solid areas of bone union but there is an area of irregular abnormal increased signal intensity on T2 weighted imaging with what appear to be 2 small bony sequestra. This most likely represents chronic osteomyelitis. There is no loosening of the hardware in the distal tibia. No definable soft tissue ulceration or subcutaneous abscess. There is skin thickening overlying the medial aspect of the distal tibia. Other: None IMPRESSION: IMPRESSION 1. Findings consistent with chronic osteomyelitis involving the distal tibial shaft at the site of prior fracture with 2 small bony sequestrations. 2.  No discrete soft tissue abscess. Electronically Signed   By: Francene Boyers M.D.   On: 09/16/2018 12:00   - pertinent xrays, CT, MRI studies were reviewed and independently interpreted  Positive ROS: All other systems have been reviewed and were otherwise negative with the exception of those mentioned in the HPI and as above.  Physical Exam: General: Alert, no acute distress Psychiatric: Patient is competent for consent with normal mood and affect Lymphatic: No axillary or cervical lymphadenopathy Cardiovascular: No pedal edema Respiratory: No cyanosis, no use of accessory musculature GI: No organomegaly, abdomen is soft and non-tender    Images:  @ENCIMAGES @   Labs:  Lab Results  Component Value Date   HGBA1C 5.1 09/11/2017   ESRSEDRATE 1 06/19/2018   ESRSEDRATE 0 01/24/2018   ESRSEDRATE 3 12/10/2017   CRP 0.9 06/19/2018   CRP <0.8 01/24/2018   CRP <0.8 12/10/2017   REPTSTATUS PENDING 09/16/2018   GRAMSTAIN  09/16/2018    RARE  WBC PRESENT, PREDOMINANTLY PMN NO ORGANISMS SEEN Performed at [facility], [address], [place], [place] [address]    CULT FEW STAPHYLOCOCCUS AUREUS 09/16/2018   LABORGA PSEUDOMONAS AERUGINOSA 10/02/2017    Lab Results  Component Value Date   ALBUMIN 4.5 09/15/2018   ALBUMIN 4.2 07/01/2018   ALBUMIN 4.0 06/19/2018    Neurologic: Patient does not have protective sensation bilateral lower extremities.   MUSCULOSKELETAL:   Skin: Examination patient has a small wound with purulent drainage medially over the tibial hardware.  Patient's leg is thin and atrophic.  Patient has a good dorsalis pedis pulse.  Review of the MRI scan shows a nonunion with osteomyelitis of the tibial fracture.  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  Plan: Plan: Discussed with the patient we could proceed with limb salvage which would be removal of the tibial plate debridement of the nonunion and osteomyelitis  placement of antibiotic beads.  Discussed the risks and benefits of surgery including the potential risk of amputation.  Patient states he understands wished to proceed at this time.  Thank you for the consult and the opportunity to see Mr. Jamerson Vonbargen, MD [facility] [phone] 9:36 AM
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
## 2018-09-18 ENCOUNTER — Ambulatory Visit (INDEPENDENT_AMBULATORY_CARE_PROVIDER_SITE_OTHER): Payer: Self-pay | Admitting: Physician Assistant

## 2018-09-18 DIAGNOSIS — S82401K Unspecified fracture of shaft of right fibula, subsequent encounter for closed fracture with nonunion: Secondary | ICD-10-CM | POA: Diagnosis not present

## 2018-09-18 DIAGNOSIS — R946 Abnormal results of thyroid function studies: Secondary | ICD-10-CM | POA: Diagnosis not present

## 2018-09-18 DIAGNOSIS — Z9889 Other specified postprocedural states: Secondary | ICD-10-CM | POA: Diagnosis not present

## 2018-09-18 DIAGNOSIS — S82201D Unspecified fracture of shaft of right tibia, subsequent encounter for closed fracture with routine healing: Secondary | ICD-10-CM

## 2018-09-18 DIAGNOSIS — B9561 Methicillin susceptible Staphylococcus aureus infection as the cause of diseases classified elsewhere: Secondary | ICD-10-CM

## 2018-09-18 DIAGNOSIS — X58XXXD Exposure to other specified factors, subsequent encounter: Secondary | ICD-10-CM

## 2018-09-18 DIAGNOSIS — M86171 Other acute osteomyelitis, right ankle and foot: Secondary | ICD-10-CM | POA: Diagnosis not present

## 2018-09-18 DIAGNOSIS — F1910 Other psychoactive substance abuse, uncomplicated: Secondary | ICD-10-CM | POA: Diagnosis not present

## 2018-09-18 DIAGNOSIS — M25571 Pain in right ankle and joints of right foot: Secondary | ICD-10-CM | POA: Diagnosis present

## 2018-09-18 DIAGNOSIS — T847XXA Infection and inflammatory reaction due to other internal orthopedic prosthetic devices, implants and grafts, initial encounter: Secondary | ICD-10-CM | POA: Diagnosis not present

## 2018-09-18 DIAGNOSIS — Z888 Allergy status to other drugs, medicaments and biological substances status: Secondary | ICD-10-CM | POA: Diagnosis not present

## 2018-09-18 DIAGNOSIS — I10 Essential (primary) hypertension: Secondary | ICD-10-CM | POA: Diagnosis present

## 2018-09-18 DIAGNOSIS — M869 Osteomyelitis, unspecified: Secondary | ICD-10-CM | POA: Diagnosis present

## 2018-09-18 DIAGNOSIS — Z7902 Long term (current) use of antithrombotics/antiplatelets: Secondary | ICD-10-CM | POA: Diagnosis not present

## 2018-09-18 DIAGNOSIS — M86661 Other chronic osteomyelitis, right tibia and fibula: Secondary | ICD-10-CM | POA: Diagnosis present

## 2018-09-18 DIAGNOSIS — G8929 Other chronic pain: Secondary | ICD-10-CM | POA: Diagnosis present

## 2018-09-18 DIAGNOSIS — Z8673 Personal history of transient ischemic attack (TIA), and cerebral infarction without residual deficits: Secondary | ICD-10-CM | POA: Diagnosis not present

## 2018-09-18 DIAGNOSIS — M86461 Chronic osteomyelitis with draining sinus, right tibia and fibula: Secondary | ICD-10-CM | POA: Diagnosis not present

## 2018-09-18 DIAGNOSIS — F3181 Bipolar II disorder: Secondary | ICD-10-CM | POA: Diagnosis present

## 2018-09-18 DIAGNOSIS — I69354 Hemiplegia and hemiparesis following cerebral infarction affecting left non-dominant side: Secondary | ICD-10-CM | POA: Diagnosis not present

## 2018-09-18 DIAGNOSIS — T847XXD Infection and inflammatory reaction due to other internal orthopedic prosthetic devices, implants and grafts, subsequent encounter: Secondary | ICD-10-CM

## 2018-09-18 DIAGNOSIS — S82201K Unspecified fracture of shaft of right tibia, subsequent encounter for closed fracture with nonunion: Secondary | ICD-10-CM | POA: Diagnosis not present

## 2018-09-18 DIAGNOSIS — Z79899 Other long term (current) drug therapy: Secondary | ICD-10-CM | POA: Diagnosis not present

## 2018-09-18 DIAGNOSIS — F17210 Nicotine dependence, cigarettes, uncomplicated: Secondary | ICD-10-CM | POA: Diagnosis present

## 2018-09-18 DIAGNOSIS — M8660 Other chronic osteomyelitis, unspecified site: Secondary | ICD-10-CM | POA: Diagnosis not present

## 2018-09-18 DIAGNOSIS — S82401D Unspecified fracture of shaft of right fibula, subsequent encounter for closed fracture with routine healing: Secondary | ICD-10-CM

## 2018-09-18 DIAGNOSIS — T847XXS Infection and inflammatory reaction due to other internal orthopedic prosthetic devices, implants and grafts, sequela: Secondary | ICD-10-CM | POA: Diagnosis not present

## 2018-09-18 DIAGNOSIS — Z5329 Procedure and treatment not carried out because of patient's decision for other reasons: Secondary | ICD-10-CM | POA: Diagnosis not present

## 2018-09-18 DIAGNOSIS — K5900 Constipation, unspecified: Secondary | ICD-10-CM | POA: Diagnosis not present

## 2018-09-18 DIAGNOSIS — M009 Pyogenic arthritis, unspecified: Secondary | ICD-10-CM | POA: Diagnosis present

## 2018-09-18 LAB — AEROBIC CULTURE  (SUPERFICIAL SPECIMEN): SPECIAL REQUESTS: NORMAL

## 2018-09-18 LAB — AEROBIC CULTURE W GRAM STAIN (SUPERFICIAL SPECIMEN)

## 2018-09-18 LAB — HEPATITIS PANEL, ACUTE
HEP A IGM: NEGATIVE
HEP B C IGM: NEGATIVE
HEP B S AG: NEGATIVE

## 2018-09-18 LAB — HIV ANTIBODY (ROUTINE TESTING W REFLEX): HIV Screen 4th Generation wRfx: NONREACTIVE

## 2018-09-18 MED ORDER — CEFAZOLIN SODIUM-DEXTROSE 2-4 GM/100ML-% IV SOLN
2.0000 g | Freq: Three times a day (TID) | INTRAVENOUS | Status: DC
  Administered 2018-09-18: 2 g via INTRAVENOUS
  Filled 2018-09-18: qty 100

## 2018-09-18 NOTE — Plan of Care (Signed)
  Problem: Education: Goal: Knowledge of General Education information will improve Description Including pain rating scale, medication(s)/side effects and non-pharmacologic comfort measures Outcome: Progressing   

## 2018-09-18 NOTE — Progress Notes (Signed)
INFECTIOUS DISEASE PROGRESS NOTE  ID: Dustin Marquez is a 37 y.o. male with  Principal Problem:   Osteomyelitis of right tibia [facility]) Active Problems:   Tibia/fibula fracture   Hardware complicating wound infection (HCC)   Status post multiple system trauma surgery   Polysubstance abuse (HCC)  Subjective: C/o pain in RLE, burning  Abtx:  Anti-infectives (From admission, onward)   Start     Dose/Rate Route Frequency Ordered Stop   09/18/18 1100  ceFAZolin (ANCEF) IVPB 2g/100 mL premix     2 g 200 mL/hr over 30 Minutes Intravenous Every 8 hours 09/18/18 0921     09/16/18 0700  vancomycin (VANCOCIN) IVPB 1000 mg/200 mL premix     1,000 mg 200 mL/hr over 60 Minutes Intravenous  Once 09/16/18 0653 09/16/18 0809   09/16/18 0215  ceFEPIme (MAXIPIME) 2 g in sodium chloride 0.9 % 100 mL IVPB     2 g 200 mL/hr over 30 Minutes Intravenous  Once 09/16/18 0205 09/16/18 0314      Medications:  Scheduled: . clopidogrel  75 mg Oral Daily  . enoxaparin (LOVENOX) injection  40 mg Subcutaneous Q24H  . gabapentin  300 mg Oral BID  . mupirocin ointment  1 application Nasal BID  . nicotine  21 mg Transdermal Daily    Objective: Vital signs in last 24 hours: Temp:  [97.9 F (36.6 C)-98.3 F (36.8 C)] 97.9 F (36.6 C) (09/26 0443) Pulse Rate:  [56-77] 56 (09/26 0443) Resp:  [14] 14 (09/26 0443) BP: (98-115)/(69-86) 98/69 (09/26 0443) SpO2:  [98 %-100 %] 98 % (09/26 0443)   General appearance: alert, cooperative and no distress Extremities: RLE wound wrapped.   Lab Results Recent Labs    09/15/18 2002  WBC 9.1  HGB 15.4  HCT 46.5  NA 142  K 4.2  CL 105  CO2 25  BUN 14  CREATININE 0.92   Liver Panel Recent Labs    09/15/18 2002  PROT 8.4*  ALBUMIN 4.5  AST 32  ALT 37  ALKPHOS 38  BILITOT 1.3*   Sedimentation Rate No results for input(s): ESRSEDRATE in the last 72 hours. C-Reactive Protein No results for input(s): CRP in the last 72  hours.  Microbiology: Recent Results (from the past 240 hour(s))  Wound or Superficial Culture     Status: None   Collection Time: 09/16/18  4:42 AM  Result Value Ref Range Status   Specimen Description   Final    WOUND RIGHT LEG Performed at [facility], [address], [place], [place] [address]    Special Requests   Final    Normal Performed at [facility], [address], [place], [place] [address]    Gram Stain   Final    RARE WBC PRESENT, PREDOMINANTLY PMN NO ORGANISMS SEEN Performed at [facility], [address] [place], [place] [address]    Culture FEW STAPHYLOCOCCUS AUREUS  Final   Report Status 09/18/2018 FINAL  Final   Organism ID, Bacteria STAPHYLOCOCCUS AUREUS  Final      Susceptibility   Staphylococcus aureus - MIC*    CIPROFLOXACIN <=0.5 SENSITIVE Sensitive     ERYTHROMYCIN <=0.25 SENSITIVE Sensitive     GENTAMICIN <=0.5 SENSITIVE Sensitive     OXACILLIN 0.5 SENSITIVE Sensitive     TETRACYCLINE <=1 SENSITIVE Sensitive     VANCOMYCIN 1 SENSITIVE Sensitive     TRIMETH/SULFA <=10 SENSITIVE Sensitive     CLINDAMYCIN <=0.25 SENSITIVE Sensitive  RIFAMPIN <=0.5 SENSITIVE Sensitive     Inducible Clindamycin NEGATIVE Sensitive     * FEW STAPHYLOCOCCUS AUREUS  Surgical PCR screen     Status: None   Collection Time: 09/16/18 11:01 PM  Result Value Ref Range Status   MRSA, PCR NEGATIVE NEGATIVE Final   Staphylococcus aureus NEGATIVE NEGATIVE Final    Comment: (NOTE) The Xpert SA Assay (FDA approved for NASAL specimens in patients 47 years of age and older), is one component of a comprehensive surveillance program. It is not intended to diagnose infection nor to guide or monitor treatment. Performed at [facility], [address], [place], [place] [address]     Studies/Results: Mr Ankle Right W Wo Contrast  Result Date: 09/16/2018 CLINICAL DATA:  Right ankle pain and swelling.  Soft tissue wound. EXAM:  MRI OF THE RIGHT ANKLE WITHOUT AND WITH CONTRAST TECHNIQUE: Multiplanar, multisequence MR imaging of the ankle was performed before and after the administration of intravenous contrast. CONTRAST:  7.5 cc Gadavist COMPARISON:  CT scan dated 09/16/2018, radiographs dated 05/23/2018 and CT scan dated 09/07/2017 FINDINGS: TENDONS Peroneal: Normal. Posteromedial: Normal. Anterior: Normal. Achilles: Normal. Plantar Fascia: Normal. LIGAMENTS Lateral: Intact. Medial: Intact. CARTILAGE Ankle Joint: Slight joint space narrowing. Tiny marginal osteophytes on the anterior and posterior aspects of the distal tibia. No joint effusion. Subtalar Joints/Sinus Tarsi: Normal. Bones: There is incomplete healing of the oblique fracture of the distal tibial shaft. Prior CT scan demonstrates extensive solid areas of bone union but there is an area of irregular abnormal increased signal intensity on T2 weighted imaging with what appear to be 2 small bony sequestra. This most likely represents chronic osteomyelitis. There is no loosening of the hardware in the distal tibia. No definable soft tissue ulceration or subcutaneous abscess. There is skin thickening overlying the medial aspect of the distal tibia. Other: None IMPRESSION: IMPRESSION 1. Findings consistent with chronic osteomyelitis involving the distal tibial shaft at the site of prior fracture with 2 small bony sequestrations. 2. No discrete soft tissue abscess. Electronically Signed   By: Francene Boyers M.D.   On: 09/16/2018 12:00     Assessment/Plan: Chronic Osteo R tibia Hardware post traumatic fracture Superficial Cx Staph aureus  Total days of antibiotics: none  Awaiting OR Cx (tomorrow) Continue to hold anbx until post-op, then start ancef.  Repeat HIV test, Hepatitis testing negative.          Johny Sax MD, FACP Infectious Diseases (pager) [phone] [email or web address] 09/18/2018, 10:20 AM  LOS: 0 days
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
## 2018-09-18 NOTE — Plan of Care (Signed)

## 2018-09-19 ENCOUNTER — Encounter (HOSPITAL_COMMUNITY): Payer: Self-pay

## 2018-09-19 ENCOUNTER — Encounter (HOSPITAL_COMMUNITY)
Admission: EM | Discharge status: Against Medical Advice | Payer: Self-pay | Source: Home / Self Care | Attending: Internal Medicine

## 2018-09-19 ENCOUNTER — Inpatient Hospital Stay (HOSPITAL_COMMUNITY): Payer: Medicaid Other | Admitting: Anesthesiology

## 2018-09-19 ENCOUNTER — Inpatient Hospital Stay: Payer: Self-pay

## 2018-09-19 DIAGNOSIS — T847XXA Infection and inflammatory reaction due to other internal orthopedic prosthetic devices, implants and grafts, initial encounter: Secondary | ICD-10-CM

## 2018-09-19 HISTORY — PX: I & D EXTREMITY: SHX5045

## 2018-09-19 HISTORY — PX: HARDWARE REMOVAL: SHX979

## 2018-09-19 HISTORY — PX: APPLICATION OF WOUND VAC: SHX5189

## 2018-09-19 LAB — BASIC METABOLIC PANEL
ANION GAP: 7 (ref 5–15)
BUN: 14 mg/dL (ref 6–20)
CALCIUM: 8.9 mg/dL (ref 8.9–10.3)
CO2: 27 mmol/L (ref 22–32)
Chloride: 104 mmol/L (ref 98–111)
Creatinine, Ser: 1 mg/dL (ref 0.61–1.24)
GFR calc Af Amer: >60 mL/min (ref >60)
GFR calc non Af Amer: >60 mL/min (ref >60)
GLUCOSE: 104 mg/dL — AB (ref 70–99)
Potassium: 4.8 mmol/L (ref 3.5–5.1)
Sodium: 138 mmol/L (ref 135–145)

## 2018-09-19 LAB — CBC
HCT: 44.9 % (ref 39.0–52.0)
HEMOGLOBIN: 14.3 g/dL (ref 13.0–17.0)
MCH: 29.2 pg (ref 26.0–34.0)
MCHC: 31.8 g/dL (ref 30.0–36.0)
MCV: 91.8 fL (ref 78.0–100.0)
Platelets: 268 10*3/uL (ref 150–400)
RBC: 4.89 MIL/uL (ref 4.22–5.81)
RDW: 14 % (ref 11.5–15.5)
WBC: 3.9 10*3/uL — ABNORMAL LOW (ref 4.0–10.5)

## 2018-09-19 SURGERY — REMOVAL, HARDWARE
Anesthesia: General | Laterality: Right

## 2018-09-19 MED ORDER — CLOPIDOGREL BISULFATE 75 MG PO TABS
75.0000 mg | ORAL_TABLET | Freq: Every day | ORAL | Status: DC
  Administered 2018-09-19 – 2018-10-30 (×42): 75 mg via ORAL
  Filled 2018-09-19 (×43): qty 1

## 2018-09-19 MED ORDER — LIDOCAINE 2% (20 MG/ML) 5 ML SYRINGE
INTRAMUSCULAR | Status: AC
  Filled 2018-09-19: qty 5

## 2018-09-19 MED ORDER — OXYCODONE HCL 5 MG PO TABS: 10.0000 mg | ORAL_TABLET | ORAL | Status: DC | PRN

## 2018-09-19 MED ORDER — 0.9 % SODIUM CHLORIDE (POUR BTL) OPTIME
TOPICAL | Status: DC | PRN
  Administered 2018-09-19: 1000 mL

## 2018-09-19 MED ORDER — MIDAZOLAM HCL 2 MG/2ML IJ SOLN
INTRAMUSCULAR | Status: AC
  Filled 2018-09-19: qty 2

## 2018-09-19 MED ORDER — HYDROMORPHONE HCL 1 MG/ML IJ SOLN
INTRAMUSCULAR | Status: AC
  Filled 2018-09-19: qty 0.5

## 2018-09-19 MED ORDER — PROPOFOL 10 MG/ML IV BOLUS
INTRAVENOUS | Status: AC
  Filled 2018-09-19: qty 20

## 2018-09-19 MED ORDER — CEFAZOLIN SODIUM-DEXTROSE 2-4 GM/100ML-% IV SOLN
INTRAVENOUS | Status: AC
  Filled 2018-09-19: qty 100

## 2018-09-19 MED ORDER — BISACODYL 10 MG RE SUPP: 10.0000 mg | Freq: Every day | RECTAL | Status: DC | PRN

## 2018-09-19 MED ORDER — METOCLOPRAMIDE HCL 5 MG PO TABS: 5.0000 mg | ORAL_TABLET | Freq: Three times a day (TID) | ORAL | Status: DC | PRN

## 2018-09-19 MED ORDER — OXYCODONE HCL 5 MG PO TABS: 5.0000 mg | ORAL_TABLET | Freq: Once | ORAL | Status: DC | PRN

## 2018-09-19 MED ORDER — ONDANSETRON HCL 4 MG/2ML IJ SOLN
INTRAMUSCULAR | Status: DC | PRN
  Administered 2018-09-19: 4 mg via INTRAVENOUS

## 2018-09-19 MED ORDER — ARTIFICIAL TEARS OPHTHALMIC OINT: TOPICAL_OINTMENT | OPHTHALMIC | Status: DC | PRN

## 2018-09-19 MED ORDER — ARTIFICIAL TEARS OPHTHALMIC OINT
TOPICAL_OINTMENT | OPHTHALMIC | Status: AC
  Filled 2018-09-19: qty 3.5

## 2018-09-19 MED ORDER — GENTAMICIN SULFATE 40 MG/ML IJ SOLN
INTRAMUSCULAR | Status: AC
  Filled 2018-09-19: qty 6

## 2018-09-19 MED ORDER — HYDROMORPHONE HCL 1 MG/ML IJ SOLN
INTRAMUSCULAR | Status: AC
  Filled 2018-09-19: qty 1

## 2018-09-19 MED ORDER — FENTANYL CITRATE (PF) 250 MCG/5ML IJ SOLN
INTRAMUSCULAR | Status: AC
  Filled 2018-09-19: qty 5

## 2018-09-19 MED ORDER — OXYCODONE HCL 5 MG/5ML PO SOLN: 5.0000 mg | Freq: Once | ORAL | Status: DC | PRN

## 2018-09-19 MED ORDER — GLYCOPYRROLATE PF 0.2 MG/ML IJ SOSY
PREFILLED_SYRINGE | INTRAMUSCULAR | Status: AC
  Filled 2018-09-19: qty 1

## 2018-09-19 MED ORDER — OXYCODONE HCL 5 MG PO TABS: 5.0000 mg | ORAL_TABLET | ORAL | Status: DC | PRN

## 2018-09-19 MED ORDER — ONDANSETRON HCL 4 MG PO TABS
4.0000 mg | ORAL_TABLET | Freq: Four times a day (QID) | ORAL | Status: DC | PRN
  Administered 2018-10-17 – 2018-10-20 (×2): 4 mg via ORAL
  Filled 2018-09-19 (×2): qty 1

## 2018-09-19 MED ORDER — PHENYLEPHRINE 40 MCG/ML (10ML) SYRINGE FOR IV PUSH (FOR BLOOD PRESSURE SUPPORT)
PREFILLED_SYRINGE | INTRAVENOUS | Status: AC
  Filled 2018-09-19: qty 10

## 2018-09-19 MED ORDER — FENTANYL CITRATE (PF) 250 MCG/5ML IJ SOLN
INTRAMUSCULAR | Status: DC | PRN
  Administered 2018-09-19 (×3): 50 ug via INTRAVENOUS

## 2018-09-19 MED ORDER — CEFAZOLIN SODIUM-DEXTROSE 2-4 GM/100ML-% IV SOLN
2.0000 g | INTRAVENOUS | Status: AC
  Administered 2018-09-19: 2 g via INTRAVENOUS

## 2018-09-19 MED ORDER — LIDOCAINE HCL (CARDIAC) PF 100 MG/5ML IV SOSY
PREFILLED_SYRINGE | INTRAVENOUS | Status: DC | PRN
  Administered 2018-09-19: 100 mg via INTRATRACHEAL

## 2018-09-19 MED ORDER — HYDROMORPHONE HCL 1 MG/ML IJ SOLN
0.2500 mg | INTRAMUSCULAR | Status: DC | PRN
  Administered 2018-09-19 (×6): 0.5 mg via INTRAVENOUS

## 2018-09-19 MED ORDER — PROPOFOL 10 MG/ML IV BOLUS
INTRAVENOUS | Status: DC | PRN
  Administered 2018-09-19: 200 mg via INTRAVENOUS

## 2018-09-19 MED ORDER — VANCOMYCIN HCL 500 MG IV SOLR
INTRAVENOUS | Status: DC | PRN
  Administered 2018-09-19: 1000 mg

## 2018-09-19 MED ORDER — POLYETHYLENE GLYCOL 3350 17 G PO PACK: 17.0000 g | PACK | Freq: Every day | ORAL | Status: DC | PRN

## 2018-09-19 MED ORDER — GLYCOPYRROLATE 0.2 MG/ML IJ SOLN
INTRAMUSCULAR | Status: DC | PRN
  Administered 2018-09-19: 0.2 mg via INTRAVENOUS

## 2018-09-19 MED ORDER — ONDANSETRON HCL 4 MG/2ML IJ SOLN: 4.0000 mg | Freq: Four times a day (QID) | INTRAMUSCULAR | Status: DC | PRN

## 2018-09-19 MED ORDER — LACTATED RINGERS IV SOLN
INTRAVENOUS | Status: DC
  Administered 2018-09-19 (×2): via INTRAVENOUS

## 2018-09-19 MED ORDER — DIPHENHYDRAMINE HCL 50 MG/ML IJ SOLN
INTRAMUSCULAR | Status: AC
  Filled 2018-09-19: qty 1

## 2018-09-19 MED ORDER — METHOCARBAMOL 1000 MG/10ML IJ SOLN
500.0000 mg | Freq: Four times a day (QID) | INTRAVENOUS | Status: DC | PRN
  Filled 2018-09-19: qty 5

## 2018-09-19 MED ORDER — ARTIFICIAL TEARS OPHTHALMIC OINT
TOPICAL_OINTMENT | OPHTHALMIC | Status: DC | PRN
  Administered 2018-09-19: 1 via OPHTHALMIC

## 2018-09-19 MED ORDER — METHOCARBAMOL 500 MG PO TABS
500.0000 mg | ORAL_TABLET | Freq: Four times a day (QID) | ORAL | Status: DC | PRN
  Administered 2018-09-20 – 2018-10-30 (×40): 500 mg via ORAL
  Filled 2018-09-19 (×44): qty 1

## 2018-09-19 MED ORDER — MIDAZOLAM HCL 2 MG/2ML IJ SOLN
INTRAMUSCULAR | Status: DC | PRN
  Administered 2018-09-19 (×2): 1 mg via INTRAVENOUS
  Administered 2018-09-19: 2 mg via INTRAVENOUS

## 2018-09-19 MED ORDER — ONDANSETRON HCL 4 MG/2ML IJ SOLN
INTRAMUSCULAR | Status: AC
  Filled 2018-09-19: qty 2

## 2018-09-19 MED ORDER — GENTAMICIN SULFATE 40 MG/ML IJ SOLN
INTRAMUSCULAR | Status: DC | PRN
  Administered 2018-09-19: 240 mg via INTRAMUSCULAR

## 2018-09-19 MED ORDER — SODIUM CHLORIDE 0.9 % IV SOLN
INTRAVENOUS | Status: DC
  Administered 2018-09-19 – 2018-10-03 (×4): via INTRAVENOUS

## 2018-09-19 MED ORDER — PHENYLEPHRINE HCL 10 MG/ML IJ SOLN
INTRAMUSCULAR | Status: DC | PRN
  Administered 2018-09-19: 80 ug via INTRAVENOUS
  Administered 2018-09-19: 120 ug via INTRAVENOUS

## 2018-09-19 MED ORDER — OXYCODONE-ACETAMINOPHEN 7.5-325 MG PO TABS
1.0000 | ORAL_TABLET | Freq: Four times a day (QID) | ORAL | Status: DC | PRN
  Administered 2018-09-19 – 2018-09-20 (×2): 2 via ORAL
  Filled 2018-09-19 (×2): qty 2

## 2018-09-19 MED ORDER — MAGNESIUM CITRATE PO SOLN: 1.0000 | Freq: Once | ORAL | Status: DC | PRN

## 2018-09-19 MED ORDER — CEFAZOLIN SODIUM-DEXTROSE 1-4 GM/50ML-% IV SOLN
1.0000 g | Freq: Three times a day (TID) | INTRAVENOUS | Status: DC
  Administered 2018-09-19 – 2018-09-20 (×3): 1 g via INTRAVENOUS
  Filled 2018-09-19 (×4): qty 50

## 2018-09-19 MED ORDER — METOCLOPRAMIDE HCL 5 MG/ML IJ SOLN: 5.0000 mg | Freq: Three times a day (TID) | INTRAMUSCULAR | Status: DC | PRN

## 2018-09-19 MED ORDER — HYDROMORPHONE HCL 1 MG/ML IJ SOLN
INTRAMUSCULAR | Status: DC | PRN
  Administered 2018-09-19: 0.5 mg via INTRAVENOUS

## 2018-09-19 MED ORDER — HYDROMORPHONE HCL 1 MG/ML IJ SOLN
0.5000 mg | INTRAMUSCULAR | Status: DC | PRN
  Administered 2018-09-19 – 2018-09-20 (×3): 1 mg via INTRAVENOUS
  Filled 2018-09-19 (×3): qty 1

## 2018-09-19 MED ORDER — ACETAMINOPHEN 325 MG PO TABS
325.0000 mg | ORAL_TABLET | Freq: Four times a day (QID) | ORAL | Status: DC | PRN
  Administered 2018-09-21 – 2018-09-23 (×4): 650 mg via ORAL
  Administered 2018-09-24: 325 mg via ORAL
  Administered 2018-09-24 – 2018-10-05 (×2): 650 mg via ORAL
  Administered 2018-10-09: 325 mg via ORAL
  Administered 2018-10-14 – 2018-10-30 (×9): 650 mg via ORAL
  Filled 2018-09-19 (×6): qty 2
  Filled 2018-09-19: qty 1
  Filled 2018-09-19 (×10): qty 2
  Filled 2018-09-19: qty 1
  Filled 2018-09-19: qty 2

## 2018-09-19 MED ORDER — PROMETHAZINE HCL 25 MG/ML IJ SOLN: 6.2500 mg | INTRAMUSCULAR | Status: DC | PRN

## 2018-09-19 MED ORDER — DIPHENHYDRAMINE HCL 50 MG/ML IJ SOLN
12.5000 mg | Freq: Once | INTRAMUSCULAR | Status: AC
  Administered 2018-09-19: 12.5 mg via INTRAVENOUS

## 2018-09-19 MED ORDER — DOCUSATE SODIUM 100 MG PO CAPS
100.0000 mg | ORAL_CAPSULE | Freq: Two times a day (BID) | ORAL | Status: DC
  Administered 2018-09-19 – 2018-09-20 (×3): 100 mg via ORAL
  Filled 2018-09-19 (×4): qty 1

## 2018-09-19 MED ORDER — VANCOMYCIN HCL 1000 MG IV SOLR
INTRAVENOUS | Status: AC
  Filled 2018-09-19: qty 1000

## 2018-09-19 SURGICAL SUPPLY — 73 items
BAG DECANTER FOR FLEXI CONT (MISCELLANEOUS) IMPLANT
BANDAGE ACE 4X5 VEL STRL LF (GAUZE/BANDAGES/DRESSINGS) ×3 IMPLANT
BANDAGE ACE 6X5 VEL STRL LF (GAUZE/BANDAGES/DRESSINGS) ×3 IMPLANT
BANDAGE ESMARK 6X9 LF (GAUZE/BANDAGES/DRESSINGS) IMPLANT
BLADE SURG 21 STRL SS (BLADE) ×1 IMPLANT
BNDG CMPR 9X6 STRL LF SNTH (GAUZE/BANDAGES/DRESSINGS)
BNDG COHESIVE 4X5 TAN STRL (GAUZE/BANDAGES/DRESSINGS) IMPLANT
BNDG COHESIVE 6X5 TAN STRL LF (GAUZE/BANDAGES/DRESSINGS) ×2 IMPLANT
BNDG ESMARK 6X9 LF (GAUZE/BANDAGES/DRESSINGS)
BNDG GAUZE ELAST 4 BULKY (GAUZE/BANDAGES/DRESSINGS) ×2 IMPLANT
CONT SPEC 4OZ CLIKSEAL STRL BL (MISCELLANEOUS) ×4 IMPLANT
COVER SURGICAL LIGHT HANDLE (MISCELLANEOUS) ×4 IMPLANT
CUFF TOURNIQUET SINGLE 34IN LL (TOURNIQUET CUFF) IMPLANT
CUFF TOURNIQUET SINGLE 44IN (TOURNIQUET CUFF) IMPLANT
DRAPE C-ARM 42X72 X-RAY (DRAPES) IMPLANT
DRAPE HALF SHEET 40X57 (DRAPES) ×2 IMPLANT
DRAPE INCISE IOBAN 66X45 STRL (DRAPES) ×2 IMPLANT
DRAPE OEC MINIVIEW 54X84 (DRAPES) ×2 IMPLANT
DRAPE ORTHO SPLIT 77X108 STRL (DRAPES)
DRAPE SURG ORHT 6 SPLT 77X108 (DRAPES) IMPLANT
DRAPE U-SHAPE 47X51 STRL (DRAPES) ×3 IMPLANT
DRESSING PREVENA PLUS CUSTOM (GAUZE/BANDAGES/DRESSINGS) IMPLANT
DRSG ADAPTIC 3X8 NADH LF (GAUZE/BANDAGES/DRESSINGS) ×1 IMPLANT
DRSG EMULSION OIL 3X3 NADH (GAUZE/BANDAGES/DRESSINGS) ×1 IMPLANT
DRSG PAD ABDOMINAL 8X10 ST (GAUZE/BANDAGES/DRESSINGS) ×1 IMPLANT
DRSG PREVENA PLUS CUSTOM (GAUZE/BANDAGES/DRESSINGS) ×3
DURAPREP 26ML APPLICATOR (WOUND CARE) ×3 IMPLANT
ELECT REM PT RETURN 9FT ADLT (ELECTROSURGICAL) ×3
ELECTRODE REM PT RTRN 9FT ADLT (ELECTROSURGICAL) ×1 IMPLANT
GAUZE SPONGE 4X4 12PLY STRL (GAUZE/BANDAGES/DRESSINGS) ×1 IMPLANT
GLOVE BIOGEL PI IND STRL 9 (GLOVE) ×1 IMPLANT
GLOVE BIOGEL PI INDICATOR 9 (GLOVE) ×2
GLOVE SURG ORTHO 9.0 STRL STRW (GLOVE) ×3 IMPLANT
GOWN STRL REUS W/ TWL XL LVL3 (GOWN DISPOSABLE) ×3 IMPLANT
GOWN STRL REUS W/TWL XL LVL3 (GOWN DISPOSABLE) ×9
HANDPIECE INTERPULSE COAX TIP (DISPOSABLE)
KIT BASIN OR (CUSTOM PROCEDURE TRAY) ×3 IMPLANT
KIT DRSG PREVENA PLUS 7DAY 125 (MISCELLANEOUS) ×2 IMPLANT
KIT PREVENA INCISION MGT20CM45 (CANNISTER) ×2 IMPLANT
KIT STIMULAN RAPID CURE  10CC (Orthopedic Implant) ×2 IMPLANT
KIT STIMULAN RAPID CURE 10CC (Orthopedic Implant) IMPLANT
KIT TURNOVER KIT B (KITS) ×3 IMPLANT
MANIFOLD NEPTUNE II (INSTRUMENTS) ×3 IMPLANT
NS IRRIG 1000ML POUR BTL (IV SOLUTION) ×3 IMPLANT
PACK ORTHO EXTREMITY (CUSTOM PROCEDURE TRAY) ×3 IMPLANT
PAD ARMBOARD 7.5X6 YLW CONV (MISCELLANEOUS) ×6 IMPLANT
PAD CAST 4YDX4 CTTN HI CHSV (CAST SUPPLIES) ×1 IMPLANT
PADDING CAST COTTON 4X4 STRL (CAST SUPPLIES) ×3
PENCIL BUTTON HOLSTER BLD 10FT (ELECTRODE) ×3 IMPLANT
SET HNDPC FAN SPRY TIP SCT (DISPOSABLE) IMPLANT
SPONGE LAP 18X18 X RAY DECT (DISPOSABLE) ×2 IMPLANT
STAPLER VISISTAT 35W (STAPLE) ×2 IMPLANT
STOCKINETTE 6  STRL (DRAPES)
STOCKINETTE 6 STRL (DRAPES) ×1 IMPLANT
STOCKINETTE IMPERVIOUS 9X36 MD (GAUZE/BANDAGES/DRESSINGS) ×2 IMPLANT
SUCTION FRAZIER HANDLE 10FR (MISCELLANEOUS) ×2
SUCTION TUBE FRAZIER 10FR DISP (MISCELLANEOUS) ×1 IMPLANT
SUT ETHILON 2 0 PSLX (SUTURE) ×6 IMPLANT
SUT ETHILON 3 0 FSLX (SUTURE) ×3 IMPLANT
SUT VIC AB 0 CT1 27 (SUTURE)
SUT VIC AB 0 CT1 27XBRD ANBCTR (SUTURE) IMPLANT
SUT VIC AB 2-0 CT1 27 (SUTURE)
SUT VIC AB 2-0 CT1 TAPERPNT 27 (SUTURE) IMPLANT
SUT VIC AB 2-0 CTB1 (SUTURE) ×3 IMPLANT
SWAB COLLECTION DEVICE MRSA (MISCELLANEOUS) ×5 IMPLANT
SWAB CULTURE ESWAB REG 1ML (MISCELLANEOUS) ×4 IMPLANT
TOWEL OR 17X24 6PK STRL BLUE (TOWEL DISPOSABLE) ×1 IMPLANT
TOWEL OR 17X26 10 PK STRL BLUE (TOWEL DISPOSABLE) ×3 IMPLANT
TUBE CONNECTING 12'X1/4 (SUCTIONS) ×1
TUBE CONNECTING 12X1/4 (SUCTIONS) ×2 IMPLANT
UNDERPAD 30X30 (UNDERPADS AND DIAPERS) ×3 IMPLANT
WATER STERILE IRR 1000ML POUR (IV SOLUTION) ×1 IMPLANT
YANKAUER SUCT BULB TIP NO VENT (SUCTIONS) ×3 IMPLANT

## 2018-09-19 NOTE — Interval H&P Note (Signed)
History and Physical Interval Note:  09/19/2018 6:41 AM  Dustin Marquez  has presented today for surgery, with the diagnosis of Osteomyelitis and Retained Hardware Right Leg  The various methods of treatment have been discussed with the patient and family. After consideration of risks, benefits and other options for treatment, the patient has consented to  Procedure(s): REMOVE HARDWARE RIGHT LEG (Right) EXCISE INFECTED BONE AND PLACE ANTIBIOTIC BEADS (Right) POSSIBLE EXTERNAL FIXATION RIGHT LEG (Right) as a surgical intervention .  The patient's history has been reviewed, patient examined, no change in status, stable for surgery.  I have reviewed the patient's chart and labs.  Questions were answered to the patient's satisfaction.     [name]
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
## 2018-09-19 NOTE — Op Note (Signed)
09/19/2018  10:57 AM  PATIENT:  Dustin Marquez    PRE-OPERATIVE DIAGNOSIS:  Osteomyelitis and Retained Hardware Right Leg  POST-OPERATIVE DIAGNOSIS:  Same  PROCEDURE:  REMOVE HARDWARE RIGHT LEG, EXCISE INFECTED BONE AND PLACE ANTIBIOTIC BEADS, APPLICATION OF WOUND VAC Local tissue rearrangement for wound closure 10 x 3 cm.  SURGEON:  [name], MD  PHYSICIAN ASSISTANT:None ANESTHESIA:   General  PREOPERATIVE INDICATIONS:  Dustin Marquez is a  37 y.o. male with a diagnosis of Osteomyelitis and Retained Hardware Right Leg who failed conservative measures and elected for surgical management.    The risks benefits and alternatives were discussed with the patient preoperatively including but not limited to the risks of infection, bleeding, nerve injury, cardiopulmonary complications, the need for revision surgery, among others, and the patient was willing to proceed.  OPERATIVE IMPLANTS: Antibiotic beads with 10 cc stimulant 1 g vancomycin and 240 mg gentamicin  @ENCIMAGES @  OPERATIVE FINDINGS: Abscess beneath the plate lateral cortex intact  OPERATIVE PROCEDURE: Patient was brought to the operating room underwent a general anesthetic.  After adequate levels of anesthesia were obtained patient's right lower extremity was prepped using DuraPrep draped into a sterile field a timeout was called.  She is a longitudinal incision was made in the abscess tissue was resected in one block of tissue this was sent for cultures.  This left a wound that was 10 x 3 cm.  Subperiosteal dissection was performed the plate and screws were removed without complications.  The fibrinous tissue beneath the plate was also sent for cultures.  The medial cortex at the site of the infection was opened with a curette and the metaphyseal bone was removed and sent for cultures.  All infected tissue was resected.  The wound was irrigated with normal saline.  The canal of the tibia was then packed with stimulant  antibiotic beads and the beads were placed around the tibia.  The tibia was stable there was not an indication to place the external fixator.  Local tissue rearrangement was used to close the wound that was 3 x 10 cm.  A 20 cm Praveena incisional VAC was applied this was covered with Covan patient was extubated taken to PACU in stable condition.   DISCHARGE PLANNING:  Antibiotic duration: Recommended continue IV antibiotics for 6 weeks with a PICC line  Weightbearing: Nonweightbearing on the right with a fracture boot in place  Pain medication: Opioid pathway ordered  Dressing care/ Wound VAC: Patient will continue with the wound VAC dressing with the hospital [facility] and discharge with the Praveena plus portable pump for 1 week  Ambulatory devices: Walker or crutches  Discharge to: Anticipate discharge to home.  Follow-up: In the office 1 week post operative.

## 2018-09-19 NOTE — Progress Notes (Signed)
INFECTIOUS DISEASE PROGRESS NOTE  ID: Dustin Marquez is a 37 y.o. male with  Principal Problem:   Chronic osteomyelitis of right tibia [facility]) Active Problems:   Tibia/fibula fracture   Hardware complicating wound infection (HCC)   Status post multiple system trauma surgery   Polysubstance abuse (HCC)   Osteomyelitis (HCC)  Subjective: No complaints  Abtx:  Anti-infectives (From admission, onward)   Start     Dose/Rate Marquez Frequency Ordered Stop   09/19/18 1800  ceFAZolin (ANCEF) IVPB 1 g/50 mL premix     1 g 100 mL/hr over 30 Minutes Intravenous Every 8 hours 09/19/18 1238 09/24/18 2159   09/19/18 0951  gentamicin (GARAMYCIN) injection  Status:  Discontinued       As needed 09/19/18 0952 09/19/18 1059   09/19/18 0950  vancomycin (VANCOCIN) powder  Status:  Discontinued       As needed 09/19/18 0951 09/19/18 1059   09/19/18 0815  ceFAZolin (ANCEF) IVPB 2g/100 mL premix     2 g 200 mL/hr over 30 Minutes Intravenous On call to O.R. 09/19/18 0810 09/19/18 1013   09/19/18 0812  ceFAZolin (ANCEF) 2-4 GM/100ML-% IVPB    Note to Pharmacy:  Sabino Niemann   : cabinet override      09/19/18 0812 09/19/18 1013   09/18/18 1100  ceFAZolin (ANCEF) IVPB 2g/100 mL premix  Status:  Discontinued     2 g 200 mL/hr over 30 Minutes Intravenous Every 8 hours 09/18/18 0921 09/18/18 1021   09/16/18 0700  vancomycin (VANCOCIN) IVPB 1000 mg/200 mL premix     1,000 mg 200 mL/hr over 60 Minutes Intravenous  Once 09/16/18 0653 09/16/18 0809   09/16/18 0215  ceFEPIme (MAXIPIME) 2 g in sodium chloride 0.9 % 100 mL IVPB     2 g 200 mL/hr over 30 Minutes Intravenous  Once 09/16/18 0205 09/16/18 0314      Medications:  Scheduled: . clopidogrel  75 mg Oral Daily  . diphenhydrAMINE      . docusate sodium  100 mg Oral BID  . enoxaparin (LOVENOX) injection  40 mg Subcutaneous Q24H  . gabapentin  300 mg Oral BID  . HYDROmorphone      . HYDROmorphone      . HYDROmorphone      . nicotine  21  mg Transdermal Daily    Objective: Vital signs in last 24 hours: Temp:  [97.3 F (36.3 C)-98.5 F (36.9 C)] 98.1 F (36.7 C) (09/27 1243) Pulse Rate:  [56-92] 65 (09/27 1243) Resp:  [14-19] 18 (09/27 1243) BP: (109-145)/(79-93) 136/79 (09/27 1243) SpO2:  [97 %-100 %] 99 % (09/27 1243)   General appearance: alert, cooperative and no distress Extremities: RLE wrapped, drain in place.   Lab Results Recent Labs    09/19/18 0444  WBC 3.9*  HGB 14.3  HCT 44.9  NA 138  K 4.8  CL 104  CO2 27  BUN 14  CREATININE 1.00   Liver Panel No results for input(s): PROT, ALBUMIN, AST, ALT, ALKPHOS, BILITOT, BILIDIR, IBILI in the last 72 hours. Sedimentation Rate No results for input(s): ESRSEDRATE in the last 72 hours. C-Reactive Protein No results for input(s): CRP in the last 72 hours.  Microbiology: Recent Results (from the past 240 hour(s))  Wound or Superficial Culture     Status: None   Collection Time: 09/16/18  4:42 AM  Result Value Ref Range Status   Specimen Description   Final    WOUND RIGHT LEG Performed at  [facility], [address] [place], [place] [address]    Special Requests   Final    Normal Performed at [facility], [address], [place], [place] [address]    Gram Stain   Final    RARE WBC PRESENT, PREDOMINANTLY PMN NO ORGANISMS SEEN Performed at [facility], [address], [place], [place] [address]    Culture FEW STAPHYLOCOCCUS AUREUS  Final   Report Status 09/18/2018 FINAL  Final   Organism ID, Bacteria STAPHYLOCOCCUS AUREUS  Final      Susceptibility   Staphylococcus aureus - MIC*    CIPROFLOXACIN <=0.5 SENSITIVE Sensitive     ERYTHROMYCIN <=0.25 SENSITIVE Sensitive     GENTAMICIN <=0.5 SENSITIVE Sensitive     OXACILLIN 0.5 SENSITIVE Sensitive     TETRACYCLINE <=1 SENSITIVE Sensitive     VANCOMYCIN 1 SENSITIVE Sensitive     TRIMETH/SULFA <=10 SENSITIVE Sensitive     CLINDAMYCIN <=0.25  SENSITIVE Sensitive     RIFAMPIN <=0.5 SENSITIVE Sensitive     Inducible Clindamycin NEGATIVE Sensitive     * FEW STAPHYLOCOCCUS AUREUS  Surgical PCR screen     Status: None   Collection Time: 09/16/18 11:01 PM  Result Value Ref Range Status   MRSA, PCR NEGATIVE NEGATIVE Final   Staphylococcus aureus NEGATIVE NEGATIVE Final    Comment: (NOTE) The Xpert SA Assay (FDA approved for NASAL specimens in patients 41 years of age and older), is one component of a comprehensive surveillance program. It is not intended to diagnose infection nor to guide or monitor treatment. Performed at [facility], [address] [place], [place] [address]   Aerobic/Anaerobic Culture (surgical/deep wound)     Status: None (Preliminary result)   Collection Time: 09/19/18 10:22 AM  Result Value Ref Range Status   Specimen Description ABSCESS RIGHT LEG  Final   Special Requests PATIENT ON FOLLOWING ANCEF  Final   Gram Stain   Final    RARE WBC PRESENT, PREDOMINANTLY PMN NO ORGANISMS SEEN Performed at [facility], [address], [place], [place] [address]    Culture PENDING  Incomplete   Report Status PENDING  Incomplete  Aerobic/Anaerobic Culture (surgical/deep wound)     Status: None (Preliminary result)   Collection Time: 09/19/18 10:35 AM  Result Value Ref Range Status   Specimen Description BONE RIGHT LEG TIBIA  Final   Special Requests PATIENT ON FOLLOWING ANCEF  Final   Gram Stain   Final    RARE WBC PRESENT, PREDOMINANTLY PMN NO ORGANISMS SEEN Performed at [facility], [address], [place], [place] [address]    Culture PENDING  Incomplete   Report Status PENDING  Incomplete    Studies/Results: [place] Ekg Site Rite  Result Date: 09/19/2018 If Site Rite image not attached, placement could not be confirmed due to current cardiac rhythm.    Assessment/Plan: Chronic R tibia ostoe Superficial Cx MSSA  Total days of antibiotics: 0 ancef  Await his op cx Start ancef  now.  Will most likely need PIC.          Johny Sax MD, FACP Infectious Diseases (pager) [phone] [email or web address] 09/19/2018, 5:45 PM  LOS: 1 day

## 2018-09-19 NOTE — Plan of Care (Signed)

## 2018-09-19 NOTE — OR Nursing (Signed)
Explanted one plate and nine screws from right tibia.

## 2018-09-19 NOTE — Anesthesia Preprocedure Evaluation (Signed)
Anesthesia Evaluation  Patient identified by MRN, date of birth, ID band Patient awake    Reviewed: Allergy & Precautions, NPO status , Patient's Chart, lab work & pertinent test results  Airway Mallampati: II  TM Distance: >3 FB Neck ROM: Full    Dental no notable dental hx.    Pulmonary Current Smoker,    Pulmonary exam normal breath sounds clear to auscultation       Cardiovascular hypertension, Normal cardiovascular exam Rhythm:Regular Rate:Normal     Neuro/Psych PSYCHIATRIC DISORDERS Depression Bipolar Disorder CVA    GI/Hepatic negative GI ROS, (+)     substance abuse  ,   Endo/Other  negative endocrine ROS  Renal/GU negative Renal ROS     Musculoskeletal negative musculoskeletal ROS (+)   Abdominal   Peds  Hematology negative hematology ROS (+)   Anesthesia Other Findings Osteomyelitis Retained Hardware Right Leg  Reproductive/Obstetrics                             Anesthesia Physical Anesthesia Plan  ASA: III  Anesthesia Plan: General   Post-op Pain Management:    Induction: Intravenous  PONV Risk Score and Plan: 1 and Ondansetron, Dexamethasone, Midazolam and Treatment may vary due to age or medical condition  Airway Management Planned: LMA  Additional Equipment:   Intra-op Plan:   Post-operative Plan: Extubation in OR  Informed Consent: I have reviewed the patients History and Physical, chart, labs and discussed the procedure including the risks, benefits and alternatives for the proposed anesthesia with the patient or authorized representative who has indicated his/her understanding and acceptance.   Dental advisory given  Plan Discussed with: CRNA  Anesthesia Plan Comments:         Anesthesia Quick Evaluation

## 2018-09-19 NOTE — Anesthesia Postprocedure Evaluation (Signed)
Anesthesia Post Note  Patient: Dustin Marquez  Procedure(s) Performed: REMOVE HARDWARE RIGHT LEG (Right ) EXCISE INFECTED BONE AND PLACE ANTIBIOTIC BEADS (Right ) APPLICATION OF WOUND VAC (Right )     Patient location during evaluation: PACU Anesthesia Type: General Level of consciousness: awake and alert Pain management: pain level controlled Vital Signs Assessment: post-procedure vital signs reviewed and stable Respiratory status: spontaneous breathing, nonlabored ventilation, respiratory function stable and patient connected to nasal cannula oxygen Cardiovascular status: blood pressure returned to baseline and stable Postop Assessment: no apparent nausea or vomiting Anesthetic complications: no    Last Vitals:  Vitals:   09/19/18 1230 09/19/18 1243  BP:  136/79  Pulse:  65  Resp:  18  Temp: 36.6 C 36.7 C  SpO2:  99%    Last Pain:  Vitals:   09/19/18 1532  TempSrc:   PainSc: Asleep                 Tiasha Helvie P Chai Routh

## 2018-09-19 NOTE — Progress Notes (Signed)
Orthopedic Tech Progress Note Patient Details:  Dustin Marquez 22-Aug-1981 [id number]  Ortho Devices Type of Ortho Device: CAM walker Ortho Device/Splint Location: at bedside  Ortho Device/Splint Interventions: Casandra Doffing 09/19/2018, 2:12 PM

## 2018-09-19 NOTE — Transfer of Care (Signed)
Immediate Anesthesia Transfer of Care Note  Patient: Dustin Marquez  Procedure(s) Performed: REMOVE HARDWARE RIGHT LEG (Right ) EXCISE INFECTED BONE AND PLACE ANTIBIOTIC BEADS (Right ) APPLICATION OF WOUND VAC (Right )  Patient Location: PACU  Anesthesia Type:General  Level of Consciousness: awake, alert  and oriented  Airway & Oxygen Therapy: Patient Spontanous Breathing and Patient connected to face mask oxygen  Post-op Assessment: Report given to RN, Post -op Vital signs reviewed and stable and Patient moving all extremities X 4  Post vital signs: Reviewed and stable  Last Vitals:  Vitals Value Taken Time  BP 138/81 09/19/2018 11:15 AM  Temp    Pulse 65 09/19/2018 11:18 AM  Resp 12 09/19/2018 11:18 AM  SpO2 100 % 09/19/2018 11:18 AM  Vitals shown include unvalidated device data.  Last Pain:  Vitals:   09/19/18 0446  TempSrc: Oral  PainSc:       Patients Stated Pain Goal: 4 (09/18/18 2009)  Complications: No apparent anesthesia complications

## 2018-09-19 NOTE — Anesthesia Procedure Notes (Signed)
Procedure Name: LMA Insertion Date/Time: 09/19/2018 10:15 AM Performed by: Leonides Grills, MD Patient Re-evaluated:Patient Re-evaluated prior to induction Oxygen Delivery Method: Circle system utilized Preoxygenation: Pre-oxygenation with 100% oxygen Induction Type: IV induction Ventilation: Mask ventilation without difficulty LMA: LMA with gastric port inserted LMA Size: 4.0 Number of attempts: 1 Placement Confirmation: positive ETCO2 and breath sounds checked- equal and bilateral Tube secured with: Tape

## 2018-09-20 LAB — CBC
HEMATOCRIT: 40 % (ref 39.0–52.0)
Hemoglobin: 12.8 g/dL — ABNORMAL LOW (ref 13.0–17.0)
MCH: 29.5 pg (ref 26.0–34.0)
MCHC: 32 g/dL (ref 30.0–36.0)
MCV: 92.2 fL (ref 78.0–100.0)
Platelets: 231 10*3/uL (ref 150–400)
RBC: 4.34 MIL/uL (ref 4.22–5.81)
RDW: 14 % (ref 11.5–15.5)
WBC: 7.1 10*3/uL (ref 4.0–10.5)

## 2018-09-20 MED ORDER — SODIUM CHLORIDE 0.9% FLUSH
10.0000 mL | INTRAVENOUS | Status: DC | PRN
  Administered 2018-10-22 – 2018-10-28 (×3): 10 mL
  Filled 2018-09-20 (×3): qty 40

## 2018-09-20 MED ORDER — HYDROMORPHONE HCL 1 MG/ML IJ SOLN
1.0000 mg | INTRAMUSCULAR | Status: DC | PRN
  Administered 2018-09-20 – 2018-09-23 (×13): 1 mg via INTRAVENOUS
  Administered 2018-09-24 (×2): 2 mg via INTRAVENOUS
  Administered 2018-09-25 – 2018-09-30 (×7): 1 mg via INTRAVENOUS
  Administered 2018-10-02: 2 mg via INTRAVENOUS
  Administered 2018-10-02 – 2018-10-04 (×5): 1 mg via INTRAVENOUS
  Filled 2018-09-20: qty 2
  Filled 2018-09-20 (×3): qty 1
  Filled 2018-09-20: qty 2
  Filled 2018-09-20 (×2): qty 1
  Filled 2018-09-20: qty 2
  Filled 2018-09-20 (×18): qty 1
  Filled 2018-09-20: qty 2
  Filled 2018-09-20: qty 1
  Filled 2018-09-20: qty 2

## 2018-09-20 MED ORDER — CEFAZOLIN SODIUM-DEXTROSE 2-4 GM/100ML-% IV SOLN
2.0000 g | Freq: Three times a day (TID) | INTRAVENOUS | Status: DC
  Administered 2018-09-20 – 2018-09-22 (×5): 2 g via INTRAVENOUS
  Filled 2018-09-20 (×6): qty 100

## 2018-09-20 MED ORDER — SODIUM CHLORIDE 0.9% FLUSH
10.0000 mL | Freq: Two times a day (BID) | INTRAVENOUS | Status: DC
  Administered 2018-09-20 – 2018-09-30 (×15): 10 mL
  Administered 2018-10-01: 20 mL
  Administered 2018-10-03 – 2018-10-21 (×8): 10 mL
  Administered 2018-10-22: 20 mL
  Administered 2018-10-23 – 2018-10-30 (×8): 10 mL
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
## 2018-09-20 NOTE — Progress Notes (Signed)
Peripherally Inserted Central Catheter/Midline Placement  The IV Nurse has discussed with the patient and/or persons authorized to consent for the patient, the purpose of this procedure and the potential benefits and risks involved with this procedure.  The benefits include less needle sticks, lab draws from the catheter, and the patient may be discharged home with the catheter. Risks include, but not limited to, infection, bleeding, blood clot (thrombus formation), and puncture of an artery; nerve damage and irregular heartbeat and possibility to perform a PICC exchange if needed/ordered by physician.  Alternatives to this procedure were also discussed.  Bard Power PICC patient education guide, fact sheet on infection prevention and patient information card has been provided to patient /or left at bedside.    PICC/Midline Placement Documentation  PICC Single Lumen 09/20/18 PICC Right Brachial 42 cm 1 cm (Active)  Indication for Insertion or Continuance of Line Home intravenous therapies (PICC only) 09/20/2018 12:10 PM  Exposed Catheter (cm) 1 cm 09/20/2018 12:10 PM  Site Assessment Clean;Dry;Intact 09/20/2018 12:10 PM  Line Status Flushed;Saline locked;Blood return noted 09/20/2018 12:10 PM  Dressing Type Transparent 09/20/2018 12:10 PM  Dressing Status Clean;Dry;Intact;Antimicrobial disc in place 09/20/2018 12:10 PM  Line Care Tubing changed;Connections checked and tightened 09/20/2018 12:10 PM  Line Adjustment (NICU/IV Team Only) No 09/20/2018 12:10 PM  Dressing Intervention New dressing 09/20/2018 12:10 PM  Dressing Change Due 09/27/18 09/20/2018 12:10 PM       Elliot Dally 09/20/2018, 12:11 PM

## 2018-09-20 NOTE — Plan of Care (Signed)
  Problem: Pain Managment: Goal: General experience of comfort will improve Outcome: Progressing   

## 2018-09-20 NOTE — Plan of Care (Signed)
  Problem: Education: Goal: Knowledge of General Education information will improve Description Including pain rating scale, medication(s)/side effects and non-pharmacologic comfort measures Outcome: Progressing   

## 2018-09-20 NOTE — Progress Notes (Signed)
Subjective: Patient stable.  Pain seems reasonably well controlled at this time   Objective: Vital signs in last 24 hours: Temp:  [97.3 F (36.3 C)-99.3 F (37.4 C)] 98.2 F (36.8 C) (09/28 0347) Pulse Rate:  [56-92] 56 (09/28 0347) Resp:  [15-19] 17 (09/28 0347) BP: (101-145)/(59-88) 101/59 (09/28 0347) SpO2:  [97 %-100 %] 97 % (09/28 0347)  Intake/Output from previous day: 09/27 0701 - 09/28 0700 In: 1550 [I.V.:1500; IV Piggyback:50] Out: 1400 [Urine:1050; Drains:300; Blood:50] Intake/Output this shift: No intake/output data recorded.  Exam:  Dorsiflexion/Plantar flexion intact No cellulitis present  Labs: Recent Labs    09/19/18 0444 09/20/18 0316  HGB 14.3 12.8*   Recent Labs    09/19/18 0444 09/20/18 0316  WBC 3.9* 7.1  RBC 4.89 4.34  HCT 44.9 40.0  PLT 268 231   Recent Labs    09/19/18 0444  NA 138  K 4.8  CL 104  CO2 27  BUN 14  CREATININE 1.00  GLUCOSE 104*  CALCIUM 8.9   No results for input(s): LABPT, INR in the last 72 hours.  Assessment/Plan: Impression is wound VAC functional status post hardware removal from the lower extremity.  Plan is mobilization to at least be out of bed to the chair today.  Anticipate wound VAC and IV antibiotics through the weekend.   G Scott Olon Russ 09/20/2018, 9:22 AM
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
## 2018-09-21 DIAGNOSIS — M8660 Other chronic osteomyelitis, unspecified site: Secondary | ICD-10-CM

## 2018-09-21 MED ORDER — SENNOSIDES-DOCUSATE SODIUM 8.6-50 MG PO TABS
2.0000 | ORAL_TABLET | Freq: Two times a day (BID) | ORAL | Status: DC
  Administered 2018-09-21 – 2018-10-29 (×63): 2 via ORAL
  Filled 2018-09-21 (×72): qty 2

## 2018-09-21 MED ORDER — POLYETHYLENE GLYCOL 3350 17 G PO PACK
17.0000 g | PACK | Freq: Every day | ORAL | Status: DC
  Administered 2018-09-28 – 2018-10-28 (×15): 17 g via ORAL
  Filled 2018-09-21 (×28): qty 1
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
## 2018-09-21 NOTE — Progress Notes (Signed)
PT Cancellation Note  Patient Details Name: Dustin Marquez MRN: [id number] DOB: 02/27/1981   Cancelled Treatment:    Reason Eval/Treat Not Completed: Other (comment)   Attempted this am (was getting bath and problem-solving getting scrub pants on with VAC); Attempted this afternoon, and [name] was sleeping VERY soundly; Discussed with Helmut Muster, RN, and we decided to hold at this time to allow him to rest;   Will follow up later today as time allows;  Otherwise, will follow up for PT tomorrow;   Thank you,  Van Clines, PT  Acute Rehabilitation Services Pager [phone] Office [phone]     Levi Aland 09/21/2018, 2:36 PM

## 2018-09-21 NOTE — Progress Notes (Signed)
Patient stable.  Wound VAC functional.  Compartments okay in the left leg.  Continue with mobilization out of bed.

## 2018-09-22 ENCOUNTER — Encounter (HOSPITAL_COMMUNITY): Payer: Self-pay | Admitting: Orthopedic Surgery

## 2018-09-22 LAB — CBC
HCT: 41 % (ref 39.0–52.0)
Hemoglobin: 13.1 g/dL (ref 13.0–17.0)
MCH: 29.3 pg (ref 26.0–34.0)
MCHC: 32 g/dL (ref 30.0–36.0)
MCV: 91.7 fL (ref 78.0–100.0)
PLATELETS: 249 10*3/uL (ref 150–400)
RBC: 4.47 MIL/uL (ref 4.22–5.81)
RDW: 13.9 % (ref 11.5–15.5)
WBC: 5.4 10*3/uL (ref 4.0–10.5)

## 2018-09-22 MED ORDER — OXYCODONE HCL 5 MG PO TABS
5.0000 mg | ORAL_TABLET | ORAL | Status: DC | PRN
  Administered 2018-09-22 – 2018-10-05 (×37): 5 mg via ORAL
  Filled 2018-09-22 (×41): qty 1

## 2018-09-22 MED ORDER — CEFAZOLIN SODIUM-DEXTROSE 2-4 GM/100ML-% IV SOLN
2.0000 g | Freq: Three times a day (TID) | INTRAVENOUS | Status: AC
  Administered 2018-09-22 – 2018-10-30 (×115): 2 g via INTRAVENOUS
  Filled 2018-09-22 (×117): qty 100

## 2018-09-22 NOTE — Progress Notes (Signed)
Patient ID: Dustin Marquez, male   DOB: [birth year], 37 y.o.   MRN: [id number] Patient is status post removal of deep retained hardware as well as infected bone from the nonunion of the right tibia.  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  The tibial canal was packed with antibiotic beads with vancomycin and gentamicin.  Patient's cultures are showing staph aureus but sensitivities are pending.

## 2018-09-22 NOTE — Progress Notes (Signed)
PHARMACY CONSULT NOTE FOR:  OUTPATIENT  PARENTERAL ANTIBIOTIC THERAPY (OPAT)  Indication: R tibia osteomyelitis Regimen: Cefazolin 2g IV q8h End date: 10/30/18  IV antibiotic discharge orders are pended. To discharging provider:  please sign these orders via discharge navigator,  Select New Orders & click on the button choice - Manage This Unsigned Work.    Thank you for allowing pharmacy to be a part of this patient's care.  Roderic Scarce Zigmund Daniel, PharmD PGY2 Infectious Diseases Pharmacy Resident Phone: [phone] 09/22/2018, 11:50 AM
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
## 2018-09-23 LAB — C-REACTIVE PROTEIN: CRP: 2 mg/dL — ABNORMAL HIGH (ref <1.0)

## 2018-09-23 LAB — SEDIMENTATION RATE: Sed Rate: 18 mm/hr — ABNORMAL HIGH (ref 0–16)

## 2018-09-23 MED ORDER — DOCUSATE SODIUM 100 MG PO CAPS
100.0000 mg | ORAL_CAPSULE | Freq: Two times a day (BID) | ORAL | Status: DC
  Administered 2018-09-23 – 2018-10-29 (×61): 100 mg via ORAL
  Filled 2018-09-23 (×67): qty 1
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
## 2018-09-23 NOTE — Progress Notes (Signed)
CSW met with patient regarding SNF consult. Patient reports going to [facility] in December 2018 for IV antibiotics he would like to return there to finish IV antibiotics and for rehab.   CSW called [facility], lvm inquiring if patient would be accepted to return.   PASRR number pending for patient, after calling PASRR said could be 3-5 days before PASRR number will be given to patient (to be added to [facility] when obtained).  CSW will continue to follow up.   [place], [place]

## 2018-09-23 NOTE — Clinical Social Work Note (Signed)
Clinical Social Work Assessment  Patient Details  Name: Dustin Marquez MRN: [id number] Date of Birth: August 19, 1981  Date of referral:  09/23/18               Reason for consult:  Discharge Planning, Care Management Concerns                Permission sought to share information with:  Case Manager, Facility Medical sales representative Permission granted to share information::  Yes, Verbal Permission Granted  Name::        Agency::  SNF's  Relationship::     Contact Information:     Housing/Transportation Living arrangements for the past 2 months:  Single Family Home Source of Information:  Patient Patient Interpreter Needed:  None Criminal Activity/Legal Involvement Pertinent to Current Situation/Hospitalization:  No - Comment as needed Significant Relationships:  Siblings Lives with:  Self Do you feel safe going back to the place where you live?  No Need for family participation in patient care:  No (Coment)  Care giving concerns:  CSW received referral for possible SNF placement at time of discharge. Spoke with patient regarding possibility of SNF placement . Patient  is currently unable to care for himself at home given patient's current needs and IV antibiotics with past drug use.  Patient expressed understanding of PT recommendation and are agreeable to SNF placement at time of discharge. CSW to continue to follow and assist with discharge planning needs.     Social Worker assessment / plan:  Spoke with patient   concerning possibility of rehab at SNF before returning home.    Employment status:  Retired Health and safety inspector:  Medicaid In [place] PT Recommendations:  Skilled Nursing Facility Information / Referral to community resources:  Skilled Nursing Facility  Patient/Family's Response to care:  Patient recognize need for rehab before returning home and is agreeable to a SNF. He reports preference for    [facility] where he spend December 2018 for similar medical problems  including IV antibiotics. CSW explained insurance authorization process.    Patient/Family's Understanding of and Emotional Response to Diagnosis, Current Treatment, and Prognosis:  Patient is realistic regarding therapy needs and expressed being hopeful for SNF placement. Patient expressed understanding of CSW role and discharge process as well as medical condition. No questions/concerns about plan or treatment.    Emotional Assessment Appearance:  Appears stated age Attitude/Demeanor/Rapport:  Gracious, Self-Confident Affect (typically observed):  Accepting Orientation:  Oriented to Self, Oriented to Place, Oriented to  Time, Oriented to Situation Alcohol / Substance use:  Other(opoid dependence) Psych involvement (Current and /or in the community):     Discharge Needs  Concerns to be addressed:  Decision making concerns, Substance Abuse Concerns, Discharge Planning Concerns Readmission within the last 30 days:  No Current discharge risk:  Lack of support system, Substance Abuse, Dependent with Mobility Barriers to Discharge:  Continued Medical Work up   [employer], LCSW 09/23/2018, 9:56 AM
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
## 2018-09-24 LAB — AEROBIC/ANAEROBIC CULTURE (SURGICAL/DEEP WOUND): CULTURE: NO GROWTH

## 2018-09-24 LAB — CBC
HCT: 40.8 % (ref 39.0–52.0)
Hemoglobin: 12.9 g/dL — ABNORMAL LOW (ref 13.0–17.0)
MCH: 28.9 pg (ref 26.0–34.0)
MCHC: 31.6 g/dL (ref 30.0–36.0)
MCV: 91.5 fL (ref 78.0–100.0)
Platelets: 282 10*3/uL (ref 150–400)
RBC: 4.46 MIL/uL (ref 4.22–5.81)
RDW: 13.9 % (ref 11.5–15.5)
WBC: 5.1 10*3/uL (ref 4.0–10.5)

## 2018-09-24 LAB — AEROBIC/ANAEROBIC CULTURE W GRAM STAIN (SURGICAL/DEEP WOUND)

## 2018-09-24 LAB — BASIC METABOLIC PANEL
Anion gap: 5 (ref 5–15)
BUN: 15 mg/dL (ref 6–20)
CALCIUM: 8.5 mg/dL — AB (ref 8.9–10.3)
CO2: 28 mmol/L (ref 22–32)
Chloride: 104 mmol/L (ref 98–111)
Creatinine, Ser: 1.04 mg/dL (ref 0.61–1.24)
GFR calc Af Amer: >60 mL/min (ref >60)
GLUCOSE: 107 mg/dL — AB (ref 70–99)
Potassium: 4.1 mmol/L (ref 3.5–5.1)
Sodium: 137 mmol/L (ref 135–145)

## 2018-09-24 NOTE — Plan of Care (Signed)
  Problem: Activity: Goal: Risk for activity intolerance will decrease 09/24/2018 0216 by Dalbert Batman, RN Outcome: Progressing 09/24/2018 0151 by Dalbert Batman, RN Outcome: Progressing   Problem: Safety: Goal: Ability to remain free from injury will improve 09/24/2018 0216 by Dalbert Batman, RN Outcome: Progressing 09/24/2018 0151 by Dalbert Batman, RN Outcome: Progressing   Problem: Skin Integrity: Goal: Risk for impaired skin integrity will decrease 09/24/2018 0216 by Dalbert Batman, RN Outcome: Progressing 09/24/2018 0151 by Dalbert Batman, RN Outcome: Progressing

## 2018-09-24 NOTE — Plan of Care (Signed)
  Problem: Activity: Goal: Risk for activity intolerance will decrease Outcome: Progressing   Problem: Safety: Goal: Ability to remain free from injury will improve Outcome: Progressing   Problem: Skin Integrity: Goal: Risk for impaired skin integrity will decrease Outcome: Progressing   
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
## 2018-09-25 NOTE — Plan of Care (Signed)
  Problem: Skin Integrity: Goal: Risk for impaired skin integrity will decrease Outcome: Progressing   

## 2018-09-25 NOTE — Progress Notes (Signed)
Patient ID: Dustin Marquez, male   DOB: [birth year], 37 y.o.   MRN: [id number] Patient states he occasionally has some pain.  The VAC is functioning well.  Cultures for staph pansensitive.  Anticipate discharge on Kefzol.  I will follow-up in the office in 1 week to change out the wound VAC.
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
## 2018-09-25 NOTE — Progress Notes (Signed)
CSW still pending PASRR number for [facility]. Patient has not been accepted to [facility] first choice. CSW spoke with patient about other options, patient open to any place that will accept Medicaid which is also a barrier to placement.   CSW sent referrals to all [facility] area SNF's, pending an acceptance.   [place], [place] [phone]

## 2018-09-26 LAB — CBC WITH DIFFERENTIAL/PLATELET
ABS IMMATURE GRANULOCYTES: 0 10*3/uL (ref 0.0–0.1)
Basophils Absolute: 0.1 10*3/uL (ref 0.0–0.1)
Basophils Relative: 1 %
EOS ABS: 0.2 10*3/uL (ref 0.0–0.7)
Eosinophils Relative: 3 %
HEMATOCRIT: 39 % (ref 39.0–52.0)
Hemoglobin: 12.4 g/dL — ABNORMAL LOW (ref 13.0–17.0)
IMMATURE GRANULOCYTES: 0 %
Lymphocytes Relative: 42 %
Lymphs Abs: 2 10*3/uL (ref 0.7–4.0)
MCH: 29.2 pg (ref 26.0–34.0)
MCHC: 31.8 g/dL (ref 30.0–36.0)
MCV: 92 fL (ref 78.0–100.0)
MONOS PCT: 9 %
Monocytes Absolute: 0.4 10*3/uL (ref 0.1–1.0)
NEUTROS PCT: 45 %
Neutro Abs: 2.2 10*3/uL (ref 1.7–7.7)
Platelets: 284 10*3/uL (ref 150–400)
RBC: 4.24 MIL/uL (ref 4.22–5.81)
RDW: 14.2 % (ref 11.5–15.5)
WBC: 4.8 10*3/uL (ref 4.0–10.5)

## 2018-09-26 LAB — COMPREHENSIVE METABOLIC PANEL
ALT: 10 U/L (ref 0–44)
AST: 27 U/L (ref 15–41)
Albumin: 3.2 g/dL — ABNORMAL LOW (ref 3.5–5.0)
Alkaline Phosphatase: 27 U/L — ABNORMAL LOW (ref 38–126)
Anion gap: 6 (ref 5–15)
BILIRUBIN TOTAL: 0.7 mg/dL (ref 0.3–1.2)
BUN: 14 mg/dL (ref 6–20)
CO2: 27 mmol/L (ref 22–32)
CREATININE: 0.96 mg/dL (ref 0.61–1.24)
Calcium: 8.1 mg/dL — ABNORMAL LOW (ref 8.9–10.3)
Chloride: 106 mmol/L (ref 98–111)
GFR calc Af Amer: >60 mL/min (ref >60)
Glucose, Bld: 100 mg/dL — ABNORMAL HIGH (ref 70–99)
Potassium: 4.2 mmol/L (ref 3.5–5.1)
Sodium: 139 mmol/L (ref 135–145)
TOTAL PROTEIN: 6.3 g/dL — AB (ref 6.5–8.1)

## 2018-09-26 LAB — TSH: TSH: 5.188 u[IU]/mL — ABNORMAL HIGH (ref 0.350–4.500)

## 2018-09-26 LAB — MAGNESIUM: MAGNESIUM: 2 mg/dL (ref 1.7–2.4)
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
## 2018-09-26 NOTE — Plan of Care (Signed)
  Problem: Skin Integrity: Goal: Risk for impaired skin integrity will decrease Outcome: Progressing   

## 2018-09-26 NOTE — Progress Notes (Signed)
Pasrr: [id number] Fredrich Birks Courtne Lighty LCSW [phone]
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
## 2018-09-28 NOTE — Plan of Care (Signed)
  Problem: Skin Integrity: Goal: Risk for impaired skin integrity will decrease Outcome: Progressing   
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
## 2018-09-29 MED ORDER — CEFAZOLIN IV (FOR PTA / DISCHARGE USE ONLY): 2.0000 g | Freq: Three times a day (TID) | INTRAVENOUS | 0 refills | Status: DC
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
## 2018-09-29 NOTE — Progress Notes (Signed)
Patients wound VAC removed. Sutures in place. Cleaned and placed mepilex dressing on. Skin warm to touch with blood none to minimal drainage.

## 2018-09-29 NOTE — Progress Notes (Signed)
Orthopedic Tech Progress Note Patient Details:  Dustin Marquez June 11, 1981 [id number]  Ortho Devices Type of Ortho Device: Crutches Ortho Device/Splint Location: at bedside  Ortho Device/Splint Interventions: Application   Post Interventions Patient Tolerated: Well Instructions Provided: Care of device   Nikki Dom 09/29/2018, 11:54 AM

## 2018-09-29 NOTE — Progress Notes (Signed)
Patient ID: Dustin Marquez, male   DOB: 1981-11-10, 37 y.o.   MRN: [id number] Orders written to discontinue the wound VAC at this time I will place orders for physical therapy progressive ambulation weightbearing as tolerated with a fracture boot in place on the right.

## 2018-09-29 NOTE — Plan of Care (Signed)
  Problem: Skin Integrity: Goal: Risk for impaired skin integrity will decrease Outcome: Progressing   

## 2018-09-29 NOTE — Progress Notes (Signed)
Multiple declines to SNFs, no acceptance as of yet due to patient substance abuse and mental health history as well as insurance. CSW will continue to reach out to identify a SNF who may accept, Dr. Roda Shutters consulted in that patient may have to stay in hospital for duration of IV antibiotics.  CSW to consult Director for further assistance in identifying a location that will accept patient.   [place], [place] [phone]
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
## 2018-10-01 DIAGNOSIS — M86171 Other acute osteomyelitis, right ankle and foot: Secondary | ICD-10-CM

## 2018-10-01 LAB — BASIC METABOLIC PANEL
Anion gap: 6 (ref 5–15)
BUN: 14 mg/dL (ref 6–20)
CHLORIDE: 106 mmol/L (ref 98–111)
CO2: 26 mmol/L (ref 22–32)
Calcium: 8.8 mg/dL — ABNORMAL LOW (ref 8.9–10.3)
Creatinine, Ser: 1.01 mg/dL (ref 0.61–1.24)
GFR calc non Af Amer: >60 mL/min (ref >60)
Glucose, Bld: 104 mg/dL — ABNORMAL HIGH (ref 70–99)
POTASSIUM: 4.3 mmol/L (ref 3.5–5.1)
Sodium: 138 mmol/L (ref 135–145)

## 2018-10-01 LAB — CBC
HEMATOCRIT: 41.1 % (ref 39.0–52.0)
HEMOGLOBIN: 12.6 g/dL — AB (ref 13.0–17.0)
MCH: 27.8 pg (ref 26.0–34.0)
MCHC: 30.7 g/dL (ref 30.0–36.0)
MCV: 90.7 fL (ref 80.0–100.0)
Platelets: 334 10*3/uL (ref 150–400)
RBC: 4.53 MIL/uL (ref 4.22–5.81)
RDW: 13.7 % (ref 11.5–15.5)
WBC: 6 10*3/uL (ref 4.0–10.5)
nRBC: 0 % (ref 0.0–0.2)
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
## 2018-10-05 LAB — GLUCOSE, CAPILLARY: Glucose-Capillary: 95 mg/dL (ref 70–99)

## 2018-10-05 MED ORDER — OXYCODONE HCL 5 MG PO TABS
5.0000 mg | ORAL_TABLET | ORAL | Status: DC | PRN
  Administered 2018-10-05 – 2018-10-22 (×35): 5 mg via ORAL
  Filled 2018-10-05 (×39): qty 1

## 2018-10-05 MED ORDER — HYDROMORPHONE HCL 1 MG/ML IJ SOLN
0.5000 mg | INTRAMUSCULAR | Status: DC | PRN
  Administered 2018-10-05 – 2018-10-08 (×6): 0.5 mg via INTRAVENOUS
  Filled 2018-10-05 (×6): qty 1
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
## 2018-10-05 NOTE — Plan of Care (Signed)
  Problem: Pain Managment: Goal: General experience of comfort will improve 10/05/2018 0343 by Aggie Moats, RN Outcome: Progressing 10/05/2018 0343 by Aggie Moats, RN Reactivated
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
## 2018-10-06 NOTE — Plan of Care (Signed)
  Problem: Pain Managment: Goal: General experience of comfort will improve Outcome: Progressing   
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
## 2018-10-07 NOTE — Plan of Care (Signed)
  Problem: Pain Managment: Goal: General experience of comfort will improve Outcome: Progressing   
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
## 2018-10-07 NOTE — Progress Notes (Signed)
Patient ID: Dustin Marquez, male   DOB: 11/19/1981, 37 y.o.   MRN: [id number] Patient resting comfortably this morning dressing clean and dry no swelling.
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
## 2018-10-08 LAB — CBC
HEMATOCRIT: 40.3 % (ref 39.0–52.0)
HEMOGLOBIN: 13.1 g/dL (ref 13.0–17.0)
MCH: 29 pg (ref 26.0–34.0)
MCHC: 32.5 g/dL (ref 30.0–36.0)
MCV: 89.4 fL (ref 80.0–100.0)
NRBC: 0 % (ref 0.0–0.2)
Platelets: 286 10*3/uL (ref 150–400)
RBC: 4.51 MIL/uL (ref 4.22–5.81)
RDW: 14 % (ref 11.5–15.5)
WBC: 4.7 10*3/uL (ref 4.0–10.5)

## 2018-10-08 LAB — BASIC METABOLIC PANEL
ANION GAP: 6 (ref 5–15)
BUN: 14 mg/dL (ref 6–20)
CHLORIDE: 106 mmol/L (ref 98–111)
CO2: 27 mmol/L (ref 22–32)
Calcium: 8.8 mg/dL — ABNORMAL LOW (ref 8.9–10.3)
Creatinine, Ser: 0.99 mg/dL (ref 0.61–1.24)
GFR calc Af Amer: >60 mL/min (ref >60)
Glucose, Bld: 92 mg/dL (ref 70–99)
POTASSIUM: 4.5 mmol/L (ref 3.5–5.1)
Sodium: 139 mmol/L (ref 135–145)

## 2018-10-08 MED ORDER — HYDROMORPHONE HCL 1 MG/ML IJ SOLN
0.5000 mg | Freq: Four times a day (QID) | INTRAMUSCULAR | Status: DC | PRN
  Administered 2018-10-08 – 2018-10-17 (×24): 0.5 mg via INTRAVENOUS
  Filled 2018-10-08 (×25): qty 1
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
## 2018-10-08 NOTE — Plan of Care (Signed)
  Problem: Pain Managment: Goal: General experience of comfort will improve Outcome: Progressing   
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
## 2018-10-09 NOTE — Plan of Care (Signed)
  Problem: Pain Managment: Goal: General experience of comfort will improve Outcome: Progressing   

## 2018-10-09 NOTE — Progress Notes (Signed)
Scant drainage noted to mepilex dressing on pt's right lower leg. Dressing changed. Pt tolerated well. Waymon Amato, MD and CMA at [facility], MD's office aware. Will continue to monitor.
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
## 2018-10-10 NOTE — Plan of Care (Signed)
  Problem: Pain Managment: Goal: General experience of comfort will improve Outcome: Progressing Note:  Pain med given as ordered- upon request.
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
## 2018-10-11 NOTE — Plan of Care (Signed)
  Problem: Pain Managment: Goal: General experience of comfort will improve Outcome: Progressing   

## 2018-10-12 NOTE — Plan of Care (Signed)
  Problem: Pain Managment: Goal: General experience of comfort will improve Outcome: Progressing   
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
## 2018-10-12 NOTE — Plan of Care (Signed)
Problem: Pain Managment: Goal: General experience of comfort will improve Outcome: Progressing   Problem: Clinical Measurements: Goal: Ability to avoid or minimize complications of infection will improve Outcome: Progressing   Problem: Skin Integrity: Goal: Skin integrity will improve Outcome: Progressing     Problem: Pain Managment: Goal: General experience of comfort will improve Outcome: Progressing   Problem: Clinical Measurements: Goal: Ability to avoid or minimize complications of infection will improve Outcome: Progressing   Problem: Skin Integrity: Goal: Skin integrity will improve Outcome: Progressing

## 2018-10-13 MED ORDER — ADULT MULTIVITAMIN W/MINERALS CH
1.0000 | ORAL_TABLET | Freq: Every day | ORAL | Status: DC
  Administered 2018-10-13 – 2018-10-30 (×18): 1 via ORAL
  Filled 2018-10-13 (×18): qty 1

## 2018-10-13 MED ORDER — ENSURE ENLIVE PO LIQD
237.0000 mL | Freq: Three times a day (TID) | ORAL | Status: DC
  Administered 2018-10-13 – 2018-10-30 (×52): 237 mL via ORAL
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
## 2018-10-13 NOTE — Plan of Care (Signed)
  Problem: Skin Integrity: Goal: Skin integrity will improve Outcome: Progressing   

## 2018-10-13 NOTE — Plan of Care (Signed)
Problem: Pain Managment: Goal: General experience of comfort will improve Outcome: Progressing   Problem: Clinical Measurements: Goal: Ability to avoid or minimize complications of infection will improve Outcome: Progressing   Problem: Skin Integrity: Goal: Skin integrity will improve Outcome: Progressing

## 2018-10-13 NOTE — Progress Notes (Addendum)
Initial Nutrition Assessment  DOCUMENTATION CODES:   Not applicable  INTERVENTION:    Ensure Enlive po TID, each supplement provides 350 kcal and 20 grams of protein  Multivitamin daily  Recommend daily weights  NUTRITION DIAGNOSIS:   Increased nutrient needs related to wound healing as evidenced by estimated needs.  GOAL:   Patient will meet greater than or equal to 90% of their needs  MONITOR:   PO intake, Supplement acceptance, Skin  REASON FOR ASSESSMENT:   LOS    ASSESSMENT:   37 yo male with PMH of HTN, Bipolar D/O, CVA, depression, and opiate dependence who was admitted on 9/24 with chronic right tibial osteomyelitis with tibial nonunion after ORIF of distal tibial fracture in 2018.   Patient reports good intake of meals for the most part, but has had a poor appetite on and off since admission. He is consuming 50-100% of meals. He thinks the IV medication that he is taking is affecting his appetite. He agreed to receive Ensure supplements between meals to maximize protein intake.   Patient to remain inpatient through 11/7 for IV antibiotic treatment.  Labs reviewed. Medications reviewed and include Colace.  Weight down 6% in the past 3 months, which is not significant for the time frame.  NUTRITION - FOCUSED PHYSICAL EXAM:    Most Recent Value  Orbital Region  No depletion  Upper Arm Region  No depletion  Thoracic and Lumbar Region  No depletion  Buccal Region  No depletion  Temple Region  No depletion  Clavicle Bone Region  Mild depletion  Clavicle and Acromion Bone Region  No depletion  Scapular Bone Region  No depletion  Dorsal Hand  No depletion  Patellar Region  No depletion  Anterior Thigh Region  No depletion  Posterior Calf Region  No depletion  Edema (RD Assessment)  None  Hair  Reviewed  Eyes  Reviewed  Mouth  Reviewed  Skin  Reviewed  Nails  Reviewed       Diet Order:   Diet Order            Diet - low sodium heart healthy         Diet regular Room service appropriate? Yes; Fluid consistency: Thin  Diet effective now              EDUCATION NEEDS:   Education needs have been addressed  Skin:  Skin Assessment: Skin Integrity Issues: Skin Integrity Issues:: Incisions Incisions: right ankle surgical wound  Last BM:  10/20  Height:   Ht Readings from Last 1 Encounters:  09/15/18 5\' 11"  (1.803 m)    Weight:   Wt Readings from Last 1 Encounters:  09/15/18 68 kg    Ideal Body Weight:  78.2 kg  BMI:  Body mass index is 20.92 kg/m.  Estimated Nutritional Needs:   Kcal:  2200-2400  Protein:  100-110 gm  Fluid:  2.2-2.4 L    Joaquin Courts, RD, LDN, CNSC Pager [phone] After Hours Pager [phone]
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
## 2018-10-14 NOTE — Plan of Care (Signed)
Problem: Pain Managment: Goal: General experience of comfort will improve Outcome: Progressing   Problem: Clinical Measurements: Goal: Ability to avoid or minimize complications of infection will improve Outcome: Progressing   Problem: Skin Integrity: Goal: Skin integrity will improve Outcome: Progressing
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
## 2018-10-15 LAB — CBC
HEMATOCRIT: 43.3 % (ref 39.0–52.0)
Hemoglobin: 13.8 g/dL (ref 13.0–17.0)
MCH: 28.9 pg (ref 26.0–34.0)
MCHC: 31.9 g/dL (ref 30.0–36.0)
MCV: 90.6 fL (ref 80.0–100.0)
PLATELETS: 209 10*3/uL (ref 150–400)
RBC: 4.78 MIL/uL (ref 4.22–5.81)
RDW: 13.7 % (ref 11.5–15.5)
WBC: 3.8 10*3/uL — ABNORMAL LOW (ref 4.0–10.5)
nRBC: 0 % (ref 0.0–0.2)

## 2018-10-15 LAB — BASIC METABOLIC PANEL
Anion gap: 8 (ref 5–15)
BUN: 21 mg/dL — AB (ref 6–20)
CALCIUM: 8.9 mg/dL (ref 8.9–10.3)
CO2: 28 mmol/L (ref 22–32)
CREATININE: 1 mg/dL (ref 0.61–1.24)
Chloride: 101 mmol/L (ref 98–111)
GFR calc non Af Amer: >60 mL/min (ref >60)
GLUCOSE: 89 mg/dL (ref 70–99)
Potassium: 4.2 mmol/L (ref 3.5–5.1)
Sodium: 137 mmol/L (ref 135–145)
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
## 2018-10-16 LAB — TSH: TSH: 3.722 u[IU]/mL (ref 0.350–4.500)

## 2018-10-16 LAB — T4, FREE: FREE T4: 0.78 ng/dL — AB (ref 0.82–1.77)

## 2018-10-16 NOTE — Progress Notes (Signed)
Orthopedic Tech Progress Note Patient Details:  Dustin Marquez 1981-07-04 [id number]  Ortho Devices Type of Ortho Device: CAM walker Ortho Device/Splint Location: Replacement cam walker Ortho Device/Splint Interventions: Application   Post Interventions Patient Tolerated: Well Instructions Provided: Care of device   Saul Fordyce 10/16/2018, 1:30 PM
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
## 2018-10-17 LAB — T3, FREE: T3, Free: 2.3 pg/mL (ref 2.0–4.4)
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
## 2018-10-18 LAB — CBC
HCT: 45.1 % (ref 39.0–52.0)
HEMOGLOBIN: 14.3 g/dL (ref 13.0–17.0)
MCH: 28.4 pg (ref 26.0–34.0)
MCHC: 31.7 g/dL (ref 30.0–36.0)
MCV: 89.5 fL (ref 80.0–100.0)
NRBC: 0 % (ref 0.0–0.2)
PLATELETS: 207 10*3/uL (ref 150–400)
RBC: 5.04 MIL/uL (ref 4.22–5.81)
RDW: 13.5 % (ref 11.5–15.5)
WBC: 4.9 10*3/uL (ref 4.0–10.5)

## 2018-10-18 LAB — BASIC METABOLIC PANEL
ANION GAP: 6 (ref 5–15)
BUN: 13 mg/dL (ref 6–20)
CHLORIDE: 103 mmol/L (ref 98–111)
CO2: 29 mmol/L (ref 22–32)
Calcium: 9.2 mg/dL (ref 8.9–10.3)
Creatinine, Ser: 1.01 mg/dL (ref 0.61–1.24)
GFR calc Af Amer: >60 mL/min (ref >60)
Glucose, Bld: 97 mg/dL (ref 70–99)
POTASSIUM: 4.2 mmol/L (ref 3.5–5.1)
Sodium: 138 mmol/L (ref 135–145)
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
## 2018-10-19 NOTE — Plan of Care (Signed)
  Problem: Pain Managment: Goal: General experience of comfort will improve Outcome: Progressing   Problem: Clinical Measurements: Goal: Ability to avoid or minimize complications of infection will improve Outcome: Progressing   Problem: Skin Integrity: Goal: Skin integrity will improve Outcome: Progressing
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
## 2018-10-20 NOTE — Plan of Care (Signed)
  Problem: Pain Managment: Goal: General experience of comfort will improve Outcome: Progressing   
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
## 2018-10-20 NOTE — Plan of Care (Signed)
Problem: Pain Managment: Goal: General experience of comfort will improve Outcome: Progressing   Problem: Clinical Measurements: Goal: Ability to avoid or minimize complications of infection will improve Outcome: Progressing   Problem: Skin Integrity: Goal: Skin integrity will improve Outcome: Progressing     Problem: Pain Managment: Goal: General experience of comfort will improve Outcome: Progressing   Problem: Clinical Measurements: Goal: Ability to avoid or minimize complications of infection will improve Outcome: Progressing   Problem: Skin Integrity: Goal: Skin integrity will improve Outcome: Progressing

## 2018-10-20 NOTE — Progress Notes (Signed)
Nutrition Follow-up  DOCUMENTATION CODES:   Not applicable  INTERVENTION:    Continue Ensure Enlive po TID, each supplement provides 350 kcal and 20 grams of protein  Continue MVI daily  NUTRITION DIAGNOSIS:   Increased nutrient needs related to wound healing as evidenced by estimated needs.  Ongoing  GOAL:   Patient will meet greater than or equal to 90% of their needs  Met with intake of meals and supplements  MONITOR:   PO intake, Supplement acceptance, Skin  ASSESSMENT:   37 yo male with PMH of HTN, Bipolar D/O, CVA, depression, and opiate dependence who was admitted on 9/24 with chronic right tibial osteomyelitis with tibial nonunion after ORIF of distal tibial fracture in 2018.   Patient has been consuming 100% of meals. He is also receiving Ensure Enlive TID between meals. Appetite has improved greatly. Intake of meals and supplements is meeting increased nutrition needs.  Labs and medications reviewed.  Patient to remain inpatient through 11/7 for IV antibiotics.   Diet Order:   Diet Order            Diet - low sodium heart healthy        Diet regular Room service appropriate? Yes; Fluid consistency: Thin  Diet effective now              EDUCATION NEEDS:   Education needs have been addressed  Skin:  Skin Assessment: Skin Integrity Issues: Skin Integrity Issues:: Incisions Incisions: right ankle surgical wound  Last BM:  10/27  Height:   Ht Readings from Last 1 Encounters:  09/15/18 '5\' 11"'  (1.803 m)    Weight:   Wt Readings from Last 1 Encounters:  09/15/18 68 kg    Ideal Body Weight:  78.2 kg  BMI:  Body mass index is 20.92 kg/m.  Estimated Nutritional Needs:   Kcal:  2200-2400  Protein:  100-110 gm  Fluid:  2.2-2.4 L    Molli Barrows, RD, LDN, [place] Pager [phone] After Hours Pager [phone]
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
## 2018-10-21 LAB — BASIC METABOLIC PANEL
Anion gap: 7 (ref 5–15)
BUN: 18 mg/dL (ref 6–20)
CALCIUM: 9.2 mg/dL (ref 8.9–10.3)
CO2: 28 mmol/L (ref 22–32)
CREATININE: 1.04 mg/dL (ref 0.61–1.24)
Chloride: 105 mmol/L (ref 98–111)
GFR calc non Af Amer: >60 mL/min (ref >60)
Glucose, Bld: 138 mg/dL — ABNORMAL HIGH (ref 70–99)
Potassium: 3.7 mmol/L (ref 3.5–5.1)
SODIUM: 140 mmol/L (ref 135–145)

## 2018-10-21 NOTE — Plan of Care (Signed)
Problem: Pain Managment: Goal: General experience of comfort will improve Outcome: Progressing   Problem: Clinical Measurements: Goal: Ability to avoid or minimize complications of infection will improve Outcome: Progressing   Problem: Skin Integrity: Goal: Skin integrity will improve Outcome: Progressing     Problem: Pain Managment: Goal: General experience of comfort will improve Outcome: Progressing   Problem: Clinical Measurements: Goal: Ability to avoid or minimize complications of infection will improve Outcome: Progressing   Problem: Skin Integrity: Goal: Skin integrity will improve Outcome: Progressing
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
## 2018-10-22 LAB — CBC
HCT: 43.1 % (ref 39.0–52.0)
Hemoglobin: 13.4 g/dL (ref 13.0–17.0)
MCH: 28 pg (ref 26.0–34.0)
MCHC: 31.1 g/dL (ref 30.0–36.0)
MCV: 90.2 fL (ref 80.0–100.0)
NRBC: 0 % (ref 0.0–0.2)
Platelets: 191 10*3/uL (ref 150–400)
RBC: 4.78 MIL/uL (ref 4.22–5.81)
RDW: 13.4 % (ref 11.5–15.5)
WBC: 5.1 10*3/uL (ref 4.0–10.5)

## 2018-10-22 LAB — BASIC METABOLIC PANEL
Anion gap: 5 (ref 5–15)
BUN: 13 mg/dL (ref 6–20)
CO2: 27 mmol/L (ref 22–32)
Calcium: 9.2 mg/dL (ref 8.9–10.3)
Chloride: 107 mmol/L (ref 98–111)
Creatinine, Ser: 0.93 mg/dL (ref 0.61–1.24)
GFR calc non Af Amer: >60 mL/min (ref >60)
Glucose, Bld: 105 mg/dL — ABNORMAL HIGH (ref 70–99)
Potassium: 4.1 mmol/L (ref 3.5–5.1)
Sodium: 139 mmol/L (ref 135–145)

## 2018-10-22 MED ORDER — OXYCODONE HCL 5 MG PO TABS
2.5000 mg | ORAL_TABLET | ORAL | Status: DC | PRN
  Administered 2018-10-22 – 2018-10-27 (×7): 2.5 mg via ORAL
  Filled 2018-10-22 (×7): qty 1

## 2018-10-22 NOTE — Plan of Care (Signed)
  Problem: Pain Managment: Goal: General experience of comfort will improve Outcome: Progressing   Problem: Skin Integrity: Goal: Skin integrity will improve Outcome: Progressing

## 2018-10-22 NOTE — Plan of Care (Signed)
Problem: Pain Managment: Goal: General experience of comfort will improve Outcome: Progressing   Problem: Clinical Measurements: Goal: Ability to avoid or minimize complications of infection will improve Outcome: Progressing   Problem: Skin Integrity: Goal: Skin integrity will improve Outcome: Progressing     Problem: Pain Managment: Goal: General experience of comfort will improve Outcome: Progressing   Problem: Clinical Measurements: Goal: Ability to avoid or minimize complications of infection will improve Outcome: Progressing   Problem: Skin Integrity: Goal: Skin integrity will improve Outcome: Progressing
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
## 2018-10-23 NOTE — Progress Notes (Signed)
PT Cancellation Note  Patient Details Name: Dustin Marquez MRN: [id number] DOB: January 30, 1981   Cancelled Treatment:    Reason Eval/Treat Not Completed: Patient declined, no reason specified PT will continue to follow acutely.    Derek Mound, PTA Acute Rehabilitation Services Pager: [phone] Office: [phone]   10/23/2018, 2:37 PM
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
## 2018-10-23 NOTE — Plan of Care (Signed)
Problem: Pain Managment: Goal: General experience of comfort will improve Outcome: Progressing   Problem: Clinical Measurements: Goal: Ability to avoid or minimize complications of infection will improve Outcome: Progressing   Problem: Skin Integrity: Goal: Skin integrity will improve Outcome: Progressing     Problem: Pain Managment: Goal: General experience of comfort will improve Outcome: Progressing   Problem: Clinical Measurements: Goal: Ability to avoid or minimize complications of infection will improve Outcome: Progressing   Problem: Skin Integrity: Goal: Skin integrity will improve Outcome: Progressing

## 2018-10-24 MED ORDER — IBUPROFEN 200 MG PO TABS
600.0000 mg | ORAL_TABLET | Freq: Four times a day (QID) | ORAL | Status: DC | PRN
  Administered 2018-10-24 – 2018-10-30 (×9): 600 mg via ORAL
  Filled 2018-10-24 (×10): qty 3
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
## 2018-10-24 NOTE — Plan of Care (Signed)
  Problem: Pain Managment: Goal: General experience of comfort will improve Outcome: Progressing   
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
## 2018-10-25 NOTE — Plan of Care (Signed)
Problem: Pain Managment: Goal: General experience of comfort will improve Outcome: Progressing   Problem: Clinical Measurements: Goal: Ability to avoid or minimize complications of infection will improve Outcome: Progressing   Problem: Skin Integrity: Goal: Skin integrity will improve Outcome: Progressing     Problem: Pain Managment: Goal: General experience of comfort will improve Outcome: Progressing   Problem: Clinical Measurements: Goal: Ability to avoid or minimize complications of infection will improve Outcome: Progressing   Problem: Skin Integrity: Goal: Skin integrity will improve Outcome: Progressing

## 2018-10-26 ENCOUNTER — Encounter (HOSPITAL_COMMUNITY): Payer: Self-pay | Admitting: *Deleted
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
## 2018-10-27 NOTE — Progress Notes (Signed)
Nutrition Follow-up  DOCUMENTATION CODES:   Not applicable  INTERVENTION:    Continue Ensure Enlive po TID, each supplement provides 350 kcal and 20 grams of protein  Continue MVI daily  NUTRITION DIAGNOSIS:   Increased nutrient needs related to wound healing as evidenced by estimated needs.  Ongoing  GOAL:   Patient will meet greater than or equal to 90% of their needs  Met with intake of meals and supplements  MONITOR:   PO intake, Supplement acceptance, Skin  ASSESSMENT:   37 yo male with PMH of HTN, Bipolar D/O, CVA, depression, and opiate dependence who was admitted on 9/24 with chronic right tibial osteomyelitis with tibial nonunion after ORIF of distal tibial fracture in 2018.   Patient consuming 50-100% of meals; also receiving Ensure Enlive TID between meals. Appetite is good. Intake of meals and supplements is meeting increased nutrition needs.  Labs and medications reviewed.  Patient to remain inpatient through 11/7 for IV antibiotics.   Diet Order:   Diet Order            Diet - low sodium heart healthy        Diet regular Room service appropriate? Yes; Fluid consistency: Thin  Diet effective now              EDUCATION NEEDS:   Education needs have been addressed  Skin:  Skin Assessment: Skin Integrity Issues: Skin Integrity Issues:: Incisions Incisions: right ankle surgical wound  Last BM:  11/4  Height:   Ht Readings from Last 1 Encounters:  09/15/18 '5\' 11"'$  (1.803 m)    Weight:   Wt Readings from Last 1 Encounters:  09/15/18 68 kg    Ideal Body Weight:  78.2 kg  BMI:  Body mass index is 20.92 kg/m.  Estimated Nutritional Needs:   Kcal:  2200-2400  Protein:  100-110 gm  Fluid:  2.2-2.4 L    Molli Barrows, RD, LDN, [place] Pager [phone] After Hours Pager [phone]
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
## 2018-10-27 NOTE — Plan of Care (Signed)
  Problem: Clinical Measurements: Goal: Ability to avoid or minimize complications of infection will improve Outcome: Progressing   Problem: Activity: Goal: Ability to avoid complications of mobility impairment will improve Outcome: Progressing Goal: Ability to tolerate increased activity will improve Outcome: Progressing   Problem: Education: Goal: Verbalization of understanding the information provided will improve Outcome: Progressing   Problem: Physical Regulation: Goal: Postoperative complications will be avoided or minimized Outcome: Progressing   Problem: Respiratory: Goal: Ability to maintain a clear airway will improve Outcome: Progressing   Problem: Pain Management: Goal: Pain level will decrease Outcome: Progressing   Problem: Tissue Perfusion: Goal: Ability to maintain adequate tissue perfusion will improve Outcome: Progressing

## 2018-10-28 ENCOUNTER — Inpatient Hospital Stay: Payer: Self-pay | Admitting: Family
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
## 2018-10-29 LAB — CBC
HCT: 42.6 % (ref 39.0–52.0)
Hemoglobin: 13.4 g/dL (ref 13.0–17.0)
MCH: 28.6 pg (ref 26.0–34.0)
MCHC: 31.5 g/dL (ref 30.0–36.0)
MCV: 90.8 fL (ref 80.0–100.0)
Platelets: 206 10*3/uL (ref 150–400)
RBC: 4.69 MIL/uL (ref 4.22–5.81)
RDW: 13.9 % (ref 11.5–15.5)
WBC: 4.6 10*3/uL (ref 4.0–10.5)
nRBC: 0 % (ref 0.0–0.2)

## 2018-10-29 LAB — BASIC METABOLIC PANEL
Anion gap: 4 — ABNORMAL LOW (ref 5–15)
BUN: 18 mg/dL (ref 6–20)
CALCIUM: 9.1 mg/dL (ref 8.9–10.3)
CO2: 30 mmol/L (ref 22–32)
CREATININE: 0.88 mg/dL (ref 0.61–1.24)
Chloride: 106 mmol/L (ref 98–111)
GLUCOSE: 100 mg/dL — AB (ref 70–99)
Potassium: 4.5 mmol/L (ref 3.5–5.1)
Sodium: 140 mmol/L (ref 135–145)
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
## 2018-10-30 DIAGNOSIS — F1910 Other psychoactive substance abuse, uncomplicated: Secondary | ICD-10-CM

## 2018-10-30 NOTE — Progress Notes (Signed)
PT Cancellation Note  Patient Details Name: Dustin Marquez MRN: [id number] DOB: [birth year]   Cancelled Treatment:    Reason Eval/Treat Not Completed: Patient declined, no reason specified Pt declined mobilizing at this time and reports ambulating independently in room. Pt eager to d/c.    Derek Mound, PTA [facility] Pager: [phone] Office: [phone]   10/30/2018, 3:33 PM

## 2018-10-30 NOTE — Plan of Care (Signed)

## 2018-10-30 NOTE — Progress Notes (Signed)
CSW provided patient with Bus transit pass, patient reported he only needed 1 and thanked clinician. Patient identified no further needs at this time.   [place], [place] [phone]
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
## 2018-10-31 MED ORDER — CLOPIDOGREL BISULFATE 75 MG PO TABS: 75.0000 mg | ORAL_TABLET | Freq: Every day | ORAL | 1 refills | Status: AC

## 2018-10-31 MED ORDER — METHOCARBAMOL 500 MG PO TABS: 500.0000 mg | ORAL_TABLET | Freq: Four times a day (QID) | ORAL | 0 refills | Status: AC | PRN

## 2018-10-31 MED ORDER — ADULT MULTIVITAMIN W/MINERALS CH: 1.0000 | ORAL_TABLET | Freq: Every day | ORAL | 0 refills | Status: AC

## 2018-10-31 MED ORDER — GABAPENTIN 300 MG PO CAPS: 300.0000 mg | ORAL_CAPSULE | Freq: Two times a day (BID) | ORAL | 3 refills | Status: AC

## 2018-10-31 MED ORDER — HYDROXYZINE HCL 50 MG PO TABS: 50.0000 mg | ORAL_TABLET | Freq: Four times a day (QID) | ORAL | 0 refills | Status: AC | PRN

## 2018-10-31 MED ORDER — ENSURE ENLIVE PO LIQD: 237.0000 mL | Freq: Three times a day (TID) | ORAL | 12 refills | Status: AC

## 2018-10-31 NOTE — Progress Notes (Signed)
RN informed by staff that pt walked off floor this AM after PICC line being taken out stating that his ride was down stairs. All pt belongings were taken out of room. MD informed.
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
## 2020-06-28 ENCOUNTER — Emergency Department (HOSPITAL_COMMUNITY)
Admission: EM | Admit: 2020-06-28 | Discharge: 2020-06-28 | Disposition: A | Discharge status: Home / Self Care | Payer: No Typology Code available for payment source | Attending: Emergency Medicine | Admitting: Emergency Medicine

## 2020-06-28 ENCOUNTER — Emergency Department (HOSPITAL_COMMUNITY)
Admission: EM | Admit: 2020-06-28 | Discharge: 2020-06-28 | Disposition: A | Discharge status: Home / Self Care | Payer: Self-pay | Attending: Emergency Medicine | Admitting: Emergency Medicine

## 2020-06-28 ENCOUNTER — Encounter (HOSPITAL_COMMUNITY): Payer: Self-pay

## 2020-06-28 ENCOUNTER — Other Ambulatory Visit: Payer: Self-pay

## 2020-06-28 ENCOUNTER — Emergency Department (HOSPITAL_COMMUNITY): Payer: No Typology Code available for payment source

## 2020-06-28 DIAGNOSIS — M542 Cervicalgia: Secondary | ICD-10-CM | POA: Diagnosis present

## 2020-06-28 DIAGNOSIS — Y939 Activity, unspecified: Secondary | ICD-10-CM | POA: Insufficient documentation

## 2020-06-28 DIAGNOSIS — M79661 Pain in right lower leg: Secondary | ICD-10-CM | POA: Insufficient documentation

## 2020-06-28 DIAGNOSIS — R112 Nausea with vomiting, unspecified: Secondary | ICD-10-CM | POA: Insufficient documentation

## 2020-06-28 DIAGNOSIS — M7918 Myalgia, other site: Secondary | ICD-10-CM

## 2020-06-28 DIAGNOSIS — M25512 Pain in left shoulder: Secondary | ICD-10-CM | POA: Diagnosis not present

## 2020-06-28 DIAGNOSIS — R519 Headache, unspecified: Secondary | ICD-10-CM | POA: Diagnosis not present

## 2020-06-28 DIAGNOSIS — F17210 Nicotine dependence, cigarettes, uncomplicated: Secondary | ICD-10-CM | POA: Insufficient documentation

## 2020-06-28 DIAGNOSIS — Y929 Unspecified place or not applicable: Secondary | ICD-10-CM | POA: Diagnosis not present

## 2020-06-28 DIAGNOSIS — Y999 Unspecified external cause status: Secondary | ICD-10-CM | POA: Diagnosis not present

## 2020-06-28 DIAGNOSIS — M25511 Pain in right shoulder: Secondary | ICD-10-CM | POA: Insufficient documentation

## 2020-06-28 DIAGNOSIS — M62830 Muscle spasm of back: Secondary | ICD-10-CM | POA: Insufficient documentation

## 2020-06-28 DIAGNOSIS — R0789 Other chest pain: Secondary | ICD-10-CM | POA: Insufficient documentation

## 2020-06-28 DIAGNOSIS — Z5321 Procedure and treatment not carried out due to patient leaving prior to being seen by health care provider: Secondary | ICD-10-CM | POA: Insufficient documentation

## 2020-06-28 DIAGNOSIS — Z79899 Other long term (current) drug therapy: Secondary | ICD-10-CM | POA: Insufficient documentation

## 2020-06-28 DIAGNOSIS — I10 Essential (primary) hypertension: Secondary | ICD-10-CM | POA: Diagnosis not present

## 2020-06-28 DIAGNOSIS — M62838 Other muscle spasm: Secondary | ICD-10-CM

## 2020-06-28 MED ORDER — CYCLOBENZAPRINE HCL 10 MG PO TABS: 10.0000 mg | ORAL_TABLET | Freq: Two times a day (BID) | ORAL | 0 refills | Status: AC | PRN
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
## 2020-06-28 NOTE — ED Notes (Addendum)
Patient not found in room. Patient not in bathrooms. Patient LWBS.
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
## 2023-03-15 ENCOUNTER — Other Ambulatory Visit: Payer: Self-pay

## 2023-03-15 ENCOUNTER — Emergency Department (HOSPITAL_COMMUNITY)
Admission: EM | Admit: 2023-03-15 | Discharge: 2023-03-15 | Disposition: A | Discharge status: Home / Self Care | Payer: Medicaid Other | Attending: Emergency Medicine | Admitting: Emergency Medicine

## 2023-03-15 ENCOUNTER — Encounter (HOSPITAL_COMMUNITY): Payer: Self-pay

## 2023-03-15 DIAGNOSIS — R112 Nausea with vomiting, unspecified: Secondary | ICD-10-CM | POA: Diagnosis present

## 2023-03-15 DIAGNOSIS — I10 Essential (primary) hypertension: Secondary | ICD-10-CM | POA: Insufficient documentation

## 2023-03-15 DIAGNOSIS — F10920 Alcohol use, unspecified with intoxication, uncomplicated: Secondary | ICD-10-CM | POA: Diagnosis not present

## 2023-03-15 DIAGNOSIS — Y906 Blood alcohol level of 120-199 mg/100 ml: Secondary | ICD-10-CM | POA: Diagnosis not present

## 2023-03-15 LAB — ETHANOL: Alcohol, Ethyl (B): 147 mg/dL — ABNORMAL HIGH (ref <10)

## 2023-03-15 LAB — CBC
HCT: 48.3 % (ref 39.0–52.0)
Hemoglobin: 15.4 g/dL (ref 13.0–17.0)
MCH: 29.1 pg (ref 26.0–34.0)
MCHC: 31.9 g/dL (ref 30.0–36.0)
MCV: 91.1 fL (ref 80.0–100.0)
Platelets: 209 10*3/uL (ref 150–400)
RBC: 5.3 MIL/uL (ref 4.22–5.81)
RDW: 14.4 % (ref 11.5–15.5)
WBC: 7.9 10*3/uL (ref 4.0–10.5)
nRBC: 0 % (ref 0.0–0.2)

## 2023-03-15 LAB — BASIC METABOLIC PANEL
Anion gap: 9 (ref 5–15)
BUN: 15 mg/dL (ref 6–20)
CO2: 22 mmol/L (ref 22–32)
Calcium: 8.1 mg/dL — ABNORMAL LOW (ref 8.9–10.3)
Chloride: 106 mmol/L (ref 98–111)
Creatinine, Ser: 1.08 mg/dL (ref 0.61–1.24)
GFR, Estimated: >60 mL/min (ref >60)
Glucose, Bld: 107 mg/dL — ABNORMAL HIGH (ref 70–99)
Potassium: 3.9 mmol/L (ref 3.5–5.1)
Sodium: 137 mmol/L (ref 135–145)

## 2023-03-15 MED ORDER — ONDANSETRON HCL 4 MG/2ML IJ SOLN
4.0000 mg | Freq: Once | INTRAMUSCULAR | Status: AC
  Administered 2023-03-15: 4 mg via INTRAVENOUS
  Filled 2023-03-15: qty 2

## 2023-03-15 MED ORDER — SODIUM CHLORIDE 0.9 % IV BOLUS
1000.0000 mL | Freq: Once | INTRAVENOUS | Status: AC
  Administered 2023-03-15: 1000 mL via INTRAVENOUS

## 2023-03-15 NOTE — ED Triage Notes (Signed)
Patient brought in by [facility] from [facility]. Reports he was drinking tonight and started vomiting. States he drank "a little liquor," unsure how much. Patient vomiting upon arrival here on EMS stretcher.

## 2023-03-15 NOTE — Discharge Instructions (Signed)
Return to the ED with any new or worsening signs or symptoms Please follow-up with PCP Please read attached guide concerning been drinking Please avoid excess alcohol intoxication in the future

## 2023-03-15 NOTE — ED Notes (Signed)
Patient given discharge instructions and follow up care. Patient verbalized understanding. IV removed with catheter intact. Patient ambulatory out of ED. 

## 2023-03-15 NOTE — ED Provider Notes (Signed)
EMERGENCY DEPARTMENT AT [facility] Provider Note   CSN: [id number] Arrival date & time: 03/15/23  0144     History  Chief Complaint  Patient presents with   Alcohol Intoxication    Dustin Marquez is a 42 y.o. male with medical history of bipolar 2 disorder, depression, hypertension.  Patient presents to the ED for evaluation of alcohol intoxication.  Per triage note, patient brought in by [facility] from the [facility].  The patient reports that he was drinking tonight and started vomiting.  Patient reports to me that he had "maybe 3 shots" of liquor tonight.  The patient was unable to tell me what kind of liquor he drank.  Patient denies history of alcohol withdrawal.  Patient denies any chest pain, shortness of breath, fevers, abdominal pain.  Patient alert and oriented x 3.   Alcohol Intoxication       Home Medications Prior to Admission medications   Medication Sig Start Date End Date Taking? Authorizing Provider  clopidogrel (PLAVIX) 75 MG tablet Take 1 tablet (75 mg total) by mouth daily. For clot prevention 10/31/18   Hosie Poisson, MD  cyclobenzaprine (FLEXERIL) 10 MG tablet Take 1 tablet (10 mg total) by mouth 2 (two) times daily as needed for muscle spasms. 06/28/20   Tegeler, Gwenyth Allegra, MD  feeding supplement, ENSURE ENLIVE, (ENSURE ENLIVE) LIQD Take 237 mLs by mouth 3 (three) times daily between meals. 10/31/18   Hosie Poisson, MD  gabapentin (NEURONTIN) 300 MG capsule Take 1 capsule (300 mg total) by mouth 2 (two) times daily. 10/31/18   Hosie Poisson, MD  hydrOXYzine (ATARAX/VISTARIL) 50 MG tablet Take 1 tablet (50 mg total) by mouth every 6 (six) hours as needed for anxiety. 10/31/18   Hosie Poisson, MD  methocarbamol (ROBAXIN) 500 MG tablet Take 1 tablet (500 mg total) by mouth every 6 (six) hours as needed for muscle spasms. 10/31/18   Hosie Poisson, MD  Multiple Vitamin (MULTIVITAMIN WITH MINERALS) TABS tablet Take 1 tablet by mouth daily.  10/31/18   Hosie Poisson, MD  nicotine (NICODERM CQ - DOSED IN MG/24 HOURS) 21 mg/24hr patch Place 1 patch (21 mg total) onto the skin daily. (May buy from over the counter): For smoking cessation Patient not taking: Reported on 09/16/2018 07/05/18   Lindell Spar I, NP      Allergies    Chlorhexidine and Lamictal [lamotrigine]    Review of Systems   Review of Systems  Constitutional:  Negative for fever.  Gastrointestinal:  Positive for nausea and vomiting.  All other systems reviewed and are negative.   Physical Exam Updated Vital Signs BP 110/82   Pulse 81   Temp (!) 97.1 F (36.2 C)   Resp 17   Ht 5\' 11"  (1.803 m)   Wt 77.1 kg   SpO2 96%   BMI 23.71 kg/m  Physical Exam Vitals and nursing note reviewed.  Constitutional:      General: He is not in acute distress.    Appearance: Normal appearance. He is not ill-appearing, toxic-appearing or diaphoretic.  HENT:     Head: Normocephalic and atraumatic.     Nose: Nose normal.     Mouth/Throat:     Mouth: Mucous membranes are moist.     Pharynx: Oropharynx is clear.  Eyes:     Pupils: Pupils are equal, round, and reactive to light.  Cardiovascular:     Rate and Rhythm: Normal rate and regular rhythm.  Pulmonary:  Effort: Pulmonary effort is normal.     Breath sounds: Normal breath sounds. No wheezing.  Abdominal:     General: Abdomen is flat. Bowel sounds are normal.     Palpations: Abdomen is soft.     Tenderness: There is no abdominal tenderness.  Musculoskeletal:     Cervical back: Normal range of motion and neck supple.  Skin:    General: Skin is warm and dry.     Capillary Refill: Capillary refill takes less than 2 seconds.  Neurological:     General: No focal deficit present.     Mental Status: He is alert and oriented to person, place, and time.     GCS: GCS eye subscore is 4. GCS verbal subscore is 5. GCS motor subscore is 6.     Cranial Nerves: Cranial nerves 2-12 are intact.     Sensory: Sensation is  intact.     Motor: Motor function is intact. No weakness.     ED Results / Procedures / Treatments   Labs (all labs ordered are listed, but only abnormal results are displayed) Labs Reviewed  ETHANOL - Abnormal; Notable for the following components:      Result Value   Alcohol, Ethyl (B) 147 (*)    All other components within normal limits  BASIC METABOLIC PANEL - Abnormal; Notable for the following components:   Glucose, Bld 107 (*)    Calcium 8.1 (*)    All other components within normal limits  CBC    EKG None  Radiology No results found.  Procedures Procedures   Medications Ordered in ED Medications  sodium chloride 0.9 % bolus 1,000 mL (1,000 mLs Intravenous New Bag/Given 03/15/23 0312)  ondansetron (ZOFRAN) injection 4 mg (4 mg Intravenous Given 03/15/23 [id number])    ED Course/ Medical Decision Making/ A&P  Medical Decision Making Amount and/or Complexity of Data Reviewed Labs: ordered.  Risk Prescription drug management.   42 year old male presents to the ED intoxicated for evaluation.  Please see HPI for further details.  On examination the patient is alert and oriented x 3.  Patient is afebrile and nontachycardic.  Lung sounds are bilaterally, he is not hypoxic.  The abdomen is soft and compressible throughout.  Neurological examination without any focal neurodeficits.   Patient CBC unremarkable without leukocytosis or anemia.  Patient BMP unremarkable.  Patient ethanol elevated at 147.  Patient provided 1 L fluid, 4 mg Zofran.  Patient allowed to rest for a total time period of 4 and half hours.  On reassessment, patient reports that he feels better at this time.  Patient is clinically sober.  Patient shows steady gait around hospital room.  Patient stable for discharge at this time.  Patient will be advised to follow-up with PCP.  Patient advised to return to the ED with any new or worsening signs or symptoms.  Patient voiced understanding.  Patient stable for  discharge.  Final Clinical Impression(s) / ED Diagnoses Final diagnoses:  Alcoholic intoxication without complication [facility])    Rx / DC Orders ED Discharge Orders     None         Lawana Chambers 03/15/23 Anastasio Champion, MD 03/15/23 2311
